# Patient Record
Sex: Male | Born: 1945
Health system: Southern US, Community
[De-identification: ages and names within clinical notes are randomized; demographics above are authoritative.]

## PROBLEM LIST (undated history)

## (undated) DIAGNOSIS — R7303 Prediabetes: Secondary | ICD-10-CM

## (undated) DIAGNOSIS — K219 Gastro-esophageal reflux disease without esophagitis: Secondary | ICD-10-CM

## (undated) DIAGNOSIS — E785 Hyperlipidemia, unspecified: Secondary | ICD-10-CM

## (undated) DIAGNOSIS — M1990 Unspecified osteoarthritis, unspecified site: Secondary | ICD-10-CM

## (undated) DIAGNOSIS — I499 Cardiac arrhythmia, unspecified: Secondary | ICD-10-CM

## (undated) HISTORY — PX: ESOPHAGOGASTRODUODENOSCOPY: SHX1529

## (undated) HISTORY — PX: MOUTH SURGERY: SHX715

## (undated) HISTORY — DX: Hyperlipidemia, unspecified: E78.5

## (undated) HISTORY — DX: Gastro-esophageal reflux disease without esophagitis: K21.9

## (undated) HISTORY — PX: COLONOSCOPY: SHX174

---

## 2005-08-09 DIAGNOSIS — I499 Cardiac arrhythmia, unspecified: Secondary | ICD-10-CM

## 2005-08-09 HISTORY — DX: Cardiac arrhythmia, unspecified: I49.9

## 2013-09-27 DIAGNOSIS — H468 Other optic neuritis: Secondary | ICD-10-CM | POA: Diagnosis not present

## 2013-09-27 DIAGNOSIS — H04129 Dry eye syndrome of unspecified lacrimal gland: Secondary | ICD-10-CM | POA: Diagnosis not present

## 2013-10-10 DIAGNOSIS — E7800 Pure hypercholesterolemia, unspecified: Secondary | ICD-10-CM | POA: Diagnosis not present

## 2013-10-10 DIAGNOSIS — K219 Gastro-esophageal reflux disease without esophagitis: Secondary | ICD-10-CM | POA: Diagnosis not present

## 2013-11-29 DIAGNOSIS — Z23 Encounter for immunization: Secondary | ICD-10-CM | POA: Diagnosis not present

## 2014-03-04 DIAGNOSIS — M25519 Pain in unspecified shoulder: Secondary | ICD-10-CM | POA: Diagnosis not present

## 2014-03-04 DIAGNOSIS — E7800 Pure hypercholesterolemia, unspecified: Secondary | ICD-10-CM | POA: Diagnosis not present

## 2014-03-27 DIAGNOSIS — M25519 Pain in unspecified shoulder: Secondary | ICD-10-CM | POA: Diagnosis not present

## 2014-04-18 DIAGNOSIS — M25519 Pain in unspecified shoulder: Secondary | ICD-10-CM | POA: Diagnosis not present

## 2014-07-30 DIAGNOSIS — M729 Fibroblastic disorder, unspecified: Secondary | ICD-10-CM | POA: Diagnosis not present

## 2014-08-29 DIAGNOSIS — I10 Essential (primary) hypertension: Secondary | ICD-10-CM | POA: Diagnosis not present

## 2014-08-29 DIAGNOSIS — E784 Other hyperlipidemia: Secondary | ICD-10-CM | POA: Diagnosis not present

## 2014-09-09 DIAGNOSIS — E784 Other hyperlipidemia: Secondary | ICD-10-CM | POA: Diagnosis not present

## 2014-09-09 DIAGNOSIS — J209 Acute bronchitis, unspecified: Secondary | ICD-10-CM | POA: Diagnosis not present

## 2015-01-22 DIAGNOSIS — E784 Other hyperlipidemia: Secondary | ICD-10-CM | POA: Diagnosis not present

## 2015-01-22 DIAGNOSIS — K219 Gastro-esophageal reflux disease without esophagitis: Secondary | ICD-10-CM | POA: Diagnosis not present

## 2015-03-17 DIAGNOSIS — H04123 Dry eye syndrome of bilateral lacrimal glands: Secondary | ICD-10-CM | POA: Diagnosis not present

## 2015-03-18 DIAGNOSIS — H04123 Dry eye syndrome of bilateral lacrimal glands: Secondary | ICD-10-CM | POA: Diagnosis not present

## 2015-03-31 DIAGNOSIS — H04123 Dry eye syndrome of bilateral lacrimal glands: Secondary | ICD-10-CM | POA: Diagnosis not present

## 2015-04-17 DIAGNOSIS — Z23 Encounter for immunization: Secondary | ICD-10-CM | POA: Diagnosis not present

## 2015-05-08 DIAGNOSIS — K648 Other hemorrhoids: Secondary | ICD-10-CM | POA: Diagnosis not present

## 2015-05-08 DIAGNOSIS — Z86010 Personal history of colonic polyps: Secondary | ICD-10-CM | POA: Diagnosis not present

## 2015-05-08 DIAGNOSIS — D123 Benign neoplasm of transverse colon: Secondary | ICD-10-CM | POA: Diagnosis not present

## 2015-05-08 DIAGNOSIS — K635 Polyp of colon: Secondary | ICD-10-CM | POA: Diagnosis not present

## 2015-09-03 ENCOUNTER — Ambulatory Visit: Payer: Self-pay | Admitting: Family Medicine

## 2015-09-04 ENCOUNTER — Encounter: Payer: Self-pay | Admitting: Family Medicine

## 2015-09-04 ENCOUNTER — Ambulatory Visit (INDEPENDENT_AMBULATORY_CARE_PROVIDER_SITE_OTHER): Payer: Medicare Other | Admitting: Family Medicine

## 2015-09-04 VITALS — BP 138/82 | HR 79 | Resp 16 | Ht 70.0 in | Wt 204.2 lb

## 2015-09-04 DIAGNOSIS — E785 Hyperlipidemia, unspecified: Secondary | ICD-10-CM | POA: Insufficient documentation

## 2015-09-04 DIAGNOSIS — H04123 Dry eye syndrome of bilateral lacrimal glands: Secondary | ICD-10-CM | POA: Insufficient documentation

## 2015-09-04 DIAGNOSIS — R739 Hyperglycemia, unspecified: Secondary | ICD-10-CM | POA: Diagnosis not present

## 2015-09-04 DIAGNOSIS — Z23 Encounter for immunization: Secondary | ICD-10-CM

## 2015-09-04 DIAGNOSIS — Z86010 Personal history of colonic polyps: Secondary | ICD-10-CM

## 2015-09-04 DIAGNOSIS — H04129 Dry eye syndrome of unspecified lacrimal gland: Secondary | ICD-10-CM

## 2015-09-04 DIAGNOSIS — M1990 Unspecified osteoarthritis, unspecified site: Secondary | ICD-10-CM | POA: Diagnosis not present

## 2015-09-04 DIAGNOSIS — I10 Essential (primary) hypertension: Secondary | ICD-10-CM | POA: Insufficient documentation

## 2015-09-04 DIAGNOSIS — K219 Gastro-esophageal reflux disease without esophagitis: Secondary | ICD-10-CM | POA: Diagnosis not present

## 2015-09-04 DIAGNOSIS — R7303 Prediabetes: Secondary | ICD-10-CM | POA: Diagnosis not present

## 2015-09-04 MED ORDER — ZOSTER VACCINE LIVE 19400 UNT/0.65ML ~~LOC~~ SOLR: 0.6500 mL | Freq: Once | SUBCUTANEOUS | Status: DC

## 2015-09-04 MED ORDER — ATORVASTATIN CALCIUM 10 MG PO TABS: 10.0000 mg | ORAL_TABLET | Freq: Every day | ORAL | Status: DC

## 2015-09-04 NOTE — Patient Instructions (Signed)
Attempt to taper off Zantac.

## 2015-09-05 DIAGNOSIS — Z860100 Personal history of colon polyps, unspecified: Secondary | ICD-10-CM | POA: Insufficient documentation

## 2015-09-05 LAB — CBC
HEMATOCRIT: 45.6 % (ref 37.5–51.0)
Hemoglobin: 15.8 g/dL (ref 12.6–17.7)
MCH: 32 pg (ref 26.6–33.0)
MCHC: 34.6 g/dL (ref 31.5–35.7)
MCV: 93 fL (ref 79–97)
PLATELETS: 243 10*3/uL (ref 150–379)
RBC: 4.93 x10E6/uL (ref 4.14–5.80)
RDW: 13.5 % (ref 12.3–15.4)
WBC: 5.9 10*3/uL (ref 3.4–10.8)

## 2015-09-05 LAB — LIPID PANEL
CHOLESTEROL TOTAL: 132 mg/dL (ref 100–199)
Chol/HDL Ratio: 3 ratio units (ref 0.0–5.0)
HDL: 44 mg/dL (ref >39)
LDL Calculated: 71 mg/dL (ref 0–99)
TRIGLYCERIDES: 87 mg/dL (ref 0–149)
VLDL Cholesterol Cal: 17 mg/dL (ref 5–40)

## 2015-09-05 LAB — COMPREHENSIVE METABOLIC PANEL
ALBUMIN: 4.6 g/dL (ref 3.6–4.8)
ALK PHOS: 135 IU/L — AB (ref 39–117)
ALT: 41 IU/L (ref 0–44)
AST: 30 IU/L (ref 0–40)
Albumin/Globulin Ratio: 1.9 (ref 1.1–2.5)
BILIRUBIN TOTAL: 0.3 mg/dL (ref 0.0–1.2)
BUN / CREAT RATIO: 17 (ref 10–22)
BUN: 16 mg/dL (ref 8–27)
CHLORIDE: 97 mmol/L (ref 96–106)
CO2: 25 mmol/L (ref 18–29)
Calcium: 9.3 mg/dL (ref 8.6–10.2)
Creatinine, Ser: 0.93 mg/dL (ref 0.76–1.27)
GFR calc Af Amer: 97 mL/min/{1.73_m2} (ref >59)
GFR calc non Af Amer: 84 mL/min/{1.73_m2} (ref >59)
GLUCOSE: 110 mg/dL — AB (ref 65–99)
Globulin, Total: 2.4 g/dL (ref 1.5–4.5)
Potassium: 4.4 mmol/L (ref 3.5–5.2)
Sodium: 139 mmol/L (ref 134–144)
Total Protein: 7 g/dL (ref 6.0–8.5)

## 2015-09-05 NOTE — Progress Notes (Addendum)
Date:  09/04/2015   Name:  Jeff Mcmahon   DOB:  08/14/1945   MRN:  [id number]  PCP:  No primary care provider on file.    Chief Complaint: Establish Care   History of Present Illness:  This is a 70 y.o. male to establish care. Hx GERD on Zantac bid x 5 yrs, previously on PPI, no EGD. Hx HLD on Lipitor x 3-4 yrs, needs refill, no established CV dz. Takes asa for prevention. Takes omega-3 for dry eyes, rheum w/u for Sjogren's negative, followed by optho Takes G/C for diffuse OA, seems to help. Tetanus imm < 10 yrs ago, pneumo imms x 2, needs zoster imm. Hx colonoscopy x 2 with polyps, last age 51. CMP/lipids year ago ok. Weight up 15# recently.  Review of Systems:  Review of Systems  Constitutional: Negative for fever and fatigue.  HENT: Negative for ear pain and sore throat.   Eyes: Negative for pain.  Respiratory: Negative for shortness of breath.   Cardiovascular: Negative for chest pain and leg swelling.  Gastrointestinal: Negative for abdominal pain.  Endocrine: Negative for polyuria.  Genitourinary: Negative for difficulty urinating.  Neurological: Negative for syncope and light-headedness.    Patient Active Problem List   Diagnosis Date Noted  . Hypertension 09/04/2015  . Hyperlipidemia 09/04/2015  . GERD (gastroesophageal reflux disease) 09/04/2015  . Dry eyes 09/04/2015    Prior to Admission medications   Medication Sig Start Date End Date Taking? Authorizing Provider  aspirin 81 MG tablet Take 81 mg by mouth daily.   Yes Historical Provider, MD  atorvastatin (LIPITOR) 10 MG tablet Take 1 tablet (10 mg total) by mouth daily. 09/04/15  Yes Adline Potter, MD  Glucosamine-Chondroitin 1500-1200 MG/30ML LIQD Take by mouth.   Yes Historical Provider, MD  Multiple Vitamins-Minerals (MENS 50+ MULTI VITAMIN/MIN PO) Take by mouth.   Yes Historical Provider, MD  Omega-3 Fatty Acids (RA FISH OIL) 1400 MG CPDR Take by mouth.   Yes Historical Provider, MD  ranitidine (ZANTAC) 150 MG  tablet Take 150 mg by mouth 2 (two) times daily.   Yes Historical Provider, MD  zoster vaccine live, PF, (ZOSTAVAX) 91478 UNT/0.65ML injection Inject 19,400 Units into the skin once. 09/04/15   Adline Potter, MD    No Known Allergies  History reviewed. No pertinent past surgical history.  Social History  Substance Use Topics  . Smoking status: Never Smoker   . Smokeless tobacco: Never Used  . Alcohol Use: 2.4 oz/week    4 Glasses of wine per week    Family History  Problem Relation Age of Onset  . Lymphoma Mother 59  . Hypertension Mother   . Cancer Father   . Alcohol abuse Father   . Mental illness Father   . Heart disease Brother     Medication list has been reviewed and updated.  Physical Examination: BP 138/82 mmHg  Pulse 79  Resp 16  Ht 5\' 10"  (1.778 m)  Wt 204 lb 3.2 oz (92.625 kg)  BMI 29.30 kg/m2  SpO2 97%  Physical Exam  Constitutional: He is oriented to person, place, and time. He appears well-developed and well-nourished.  HENT:  Head: Normocephalic and atraumatic.  Right Ear: External ear normal.  Left Ear: External ear normal.  Nose: Nose normal.  Mouth/Throat: Oropharynx is clear and moist.  TM's clear  Eyes: Conjunctivae and EOM are normal. Pupils are equal, round, and reactive to light.  Neck: Neck supple. No thyromegaly present.  Cardiovascular: Normal rate, regular rhythm  and normal heart sounds.   Pulmonary/Chest: Effort normal and breath sounds normal.  Abdominal: Soft. He exhibits no distension and no mass. There is no tenderness.  Genitourinary: Penis normal.  Testes normal no hernias  Musculoskeletal: He exhibits no edema.  Lymphadenopathy:    He has no cervical adenopathy.  Neurological: He is alert and oriented to person, place, and time. Coordination normal.  Skin: Skin is warm and dry.  Psychiatric: He has a normal mood and affect. His behavior is normal.  Nursing note and vitals reviewed.   Assessment and Plan:  1.  Hyperlipidemia Unclear indication for statin given lack of established CV dz but will refill Lipitor for now and check labs - Lipid Profile - Comprehensive Metabolic Panel (CMET)  2. Gastroesophageal reflux disease without esophagitis Discussed weaning off to avoid LT se's, will attempt - CBC  3. Osteoarthritis, unspecified osteoarthritis type, unspecified site Well controlled, cont G/C  4. Dry eyes, unspecified laterality Followed by optho (may need referral)  5. Need for zoster vaccination Zoster imm rx sent  6. Hx colon polyps Will need f/u colonoscopy soon  Return in about 4 weeks (around 10/02/2015).  [name]. [facility]  09/05/2015    Addendum: Calculated 10 yr CV risk on statin 15%, plan to continue statin/asa

## 2015-09-09 ENCOUNTER — Encounter: Payer: Self-pay | Admitting: Family Medicine

## 2015-09-09 DIAGNOSIS — R7303 Prediabetes: Secondary | ICD-10-CM | POA: Insufficient documentation

## 2015-09-10 LAB — HGB A1C W/O EAG: HEMOGLOBIN A1C: 5.7 % — AB (ref 4.8–5.6)

## 2015-09-10 LAB — SPECIMEN STATUS REPORT

## 2015-10-02 ENCOUNTER — Ambulatory Visit (INDEPENDENT_AMBULATORY_CARE_PROVIDER_SITE_OTHER): Payer: Medicare Other | Admitting: Family Medicine

## 2015-10-02 ENCOUNTER — Encounter: Payer: Self-pay | Admitting: Family Medicine

## 2015-10-02 VITALS — BP 116/76 | HR 72 | Ht 70.0 in | Wt 204.0 lb

## 2015-10-02 DIAGNOSIS — M150 Primary generalized (osteo)arthritis: Secondary | ICD-10-CM | POA: Diagnosis not present

## 2015-10-02 DIAGNOSIS — Z86010 Personal history of colonic polyps: Secondary | ICD-10-CM

## 2015-10-02 DIAGNOSIS — M159 Polyosteoarthritis, unspecified: Secondary | ICD-10-CM

## 2015-10-02 DIAGNOSIS — M47812 Spondylosis without myelopathy or radiculopathy, cervical region: Secondary | ICD-10-CM | POA: Insufficient documentation

## 2015-10-02 DIAGNOSIS — E785 Hyperlipidemia, unspecified: Secondary | ICD-10-CM

## 2015-10-02 DIAGNOSIS — M4722 Other spondylosis with radiculopathy, cervical region: Secondary | ICD-10-CM | POA: Insufficient documentation

## 2015-10-02 DIAGNOSIS — H04129 Dry eye syndrome of unspecified lacrimal gland: Secondary | ICD-10-CM | POA: Diagnosis not present

## 2015-10-02 DIAGNOSIS — R7303 Prediabetes: Secondary | ICD-10-CM | POA: Diagnosis not present

## 2015-10-02 DIAGNOSIS — K219 Gastro-esophageal reflux disease without esophagitis: Secondary | ICD-10-CM

## 2015-10-02 DIAGNOSIS — M1990 Unspecified osteoarthritis, unspecified site: Secondary | ICD-10-CM | POA: Insufficient documentation

## 2015-10-02 NOTE — Progress Notes (Signed)
Date:  10/02/2015   Name:  Jeff Mcmahon   DOB:  07/27/1946   MRN:  [id number]  PCP:  Adline Potter, MD    Chief Complaint: Follow-up and Hyperlipidemia   History of Present Illness:  This is a 70 y.o. male for 1 month f/u from initial visit. Attempt to wean off Zantac unsuccessful, back to taking qhs with good sx control. OA well controlled on G/C. Dry eyes well controlled on eye drops, has seen optho in past who did not recommend further eval. Received zoster imm at pharmacy. Has last colonoscopy 04/2015 told repeat in 5 years. Blood work showed prediabetes. Asks about hep C screening. Walking several miles daily.  Review of Systems:  Review of Systems  Respiratory: Negative for shortness of breath.   Cardiovascular: Negative for chest pain and leg swelling.  Endocrine: Negative for polyuria.  Genitourinary: Negative for difficulty urinating.  Neurological: Negative for syncope and light-headedness.    Patient Active Problem List   Diagnosis Date Noted  . Prediabetes 09/09/2015  . Hx of colonic polyps 09/05/2015  . Hyperlipidemia 09/04/2015  . GERD (gastroesophageal reflux disease) 09/04/2015  . Dry eyes 09/04/2015    Prior to Admission medications   Medication Sig Start Date End Date Taking? Authorizing Provider  aspirin 81 MG tablet Take 81 mg by mouth daily.   Yes Historical Provider, MD  atorvastatin (LIPITOR) 10 MG tablet Take 1 tablet (10 mg total) by mouth daily. 09/04/15  Yes Adline Potter, MD  carboxymethylcellulose (REFRESH PLUS) 0.5 % SOLN 1 drop 3 (three) times daily as needed.   Yes Historical Provider, MD  Glucosamine-Chondroitin 1500-1200 MG/30ML LIQD Take by mouth.   Yes Historical Provider, MD  Multiple Vitamins-Minerals (MENS 50+ MULTI VITAMIN/MIN PO) Take by mouth.   Yes Historical Provider, MD  Omega-3 Fatty Acids (RA FISH OIL) 1400 MG CPDR Take by mouth.   Yes Historical Provider, MD  ranitidine (ZANTAC) 150 MG tablet Take 150 mg by mouth 2 (two) times  daily.   Yes Historical Provider, MD    No Known Allergies  Past Surgical History  Procedure Laterality Date  . No past surgeries      Social History  Substance Use Topics  . Smoking status: Never Smoker   . Smokeless tobacco: Never Used  . Alcohol Use: 2.4 oz/week    4 Glasses of wine per week     Comment: occasional    Family History  Problem Relation Age of Onset  . Lymphoma Mother 38  . Hypertension Mother   . Cancer Father   . Alcohol abuse Father   . Mental illness Father   . Heart disease Brother     Medication list has been reviewed and updated.  Physical Examination: BP 116/76 mmHg  Pulse 72  Ht 5\' 10"  (1.778 m)  Wt 204 lb (92.534 kg)  BMI 29.27 kg/m2  Physical Exam  Constitutional: He appears well-developed and well-nourished.  Cardiovascular: Normal rate, regular rhythm and normal heart sounds.   Pulmonary/Chest: Effort normal and breath sounds normal.  Musculoskeletal: He exhibits no edema.  Neurological: He is alert.  Skin: Skin is warm and dry.  Psychiatric: He has a normal mood and affect. His behavior is normal.  Nursing note and vitals reviewed.   Assessment and Plan:  1. Prediabetes Dx/px discussed, recommend NCS diet, exercise, and weight loss, recheck a1c next visit  2. Gastroesophageal reflux disease without esophagitis Well controlled on qhs Zantac, continue  3. Hyperlipidemia Recommend continue Lipitor/asa given 68yr CVR  15%  4. Dry eyes, unspecified laterality Well controlled on Refresh eye drops  5. OA Well controlled on G/C  6. Hx of colonic polyps Repeat colonoscopy 2021  7. Health maintenance Consider hep C screening with a1c next visit  Return in about 3 months (around 12/30/2015).  [name]. [facility]  10/02/2015

## 2015-12-30 ENCOUNTER — Ambulatory Visit (INDEPENDENT_AMBULATORY_CARE_PROVIDER_SITE_OTHER): Payer: Medicare Other | Admitting: Family Medicine

## 2015-12-30 ENCOUNTER — Encounter: Payer: Self-pay | Admitting: Family Medicine

## 2015-12-30 VITALS — BP 120/76 | HR 64 | Ht 70.0 in | Wt 198.0 lb

## 2015-12-30 DIAGNOSIS — E785 Hyperlipidemia, unspecified: Secondary | ICD-10-CM | POA: Diagnosis not present

## 2015-12-30 DIAGNOSIS — K219 Gastro-esophageal reflux disease without esophagitis: Secondary | ICD-10-CM | POA: Diagnosis not present

## 2015-12-30 DIAGNOSIS — Z0000 Encounter for general adult medical examination without abnormal findings: Secondary | ICD-10-CM

## 2015-12-30 DIAGNOSIS — H04123 Dry eye syndrome of bilateral lacrimal glands: Secondary | ICD-10-CM

## 2015-12-30 DIAGNOSIS — M159 Polyosteoarthritis, unspecified: Secondary | ICD-10-CM

## 2015-12-30 DIAGNOSIS — M150 Primary generalized (osteo)arthritis: Secondary | ICD-10-CM | POA: Diagnosis not present

## 2015-12-30 DIAGNOSIS — R7303 Prediabetes: Secondary | ICD-10-CM | POA: Diagnosis not present

## 2015-12-30 NOTE — Progress Notes (Signed)
Date:  12/30/2015   Name:  Jeff Mcmahon   DOB:  [birth year]   MRN:  [id number]  PCP:  Adline Potter, MD    Chief Complaint: Follow-up   History of Present Illness:  This is a 70 y.o. male seen in three month f/u. No new concerns. Recent dx prediabetes, now walking 15-20 miles per week and weight down 6#. GERD well controlled on Zantac qhs, attempt to d/c unsuccessful but plans to try again. Taking Lipitor and asa for elevated CVR. Dry eyes and OA sxs stable.  Review of Systems:  Review of Systems  Constitutional: Negative for fever and fatigue.  Respiratory: Negative for cough and shortness of breath.   Cardiovascular: Negative for chest pain and leg swelling.  Endocrine: Negative for polyuria.  Genitourinary: Negative for difficulty urinating.  Neurological: Negative for syncope and light-headedness.    Patient Active Problem List   Diagnosis Date Noted  . Osteoarthritis 10/02/2015  . Prediabetes 09/09/2015  . Hx of colonic polyps 09/05/2015  . Hyperlipidemia 09/04/2015  . GERD (gastroesophageal reflux disease) 09/04/2015  . Dry eyes 09/04/2015    Prior to Admission medications   Medication Sig Start Date End Date Taking? Authorizing Provider  aspirin 81 MG tablet Take 81 mg by mouth daily.   Yes Historical Provider, MD  atorvastatin (LIPITOR) 10 MG tablet Take 1 tablet (10 mg total) by mouth daily. 09/04/15  Yes Adline Potter, MD  carboxymethylcellulose (REFRESH PLUS) 0.5 % SOLN 1 drop 3 (three) times daily as needed.   Yes Historical Provider, MD  Glucosamine-Chondroitin 1500-1200 MG/30ML LIQD Take by mouth.   Yes Historical Provider, MD  Multiple Vitamins-Minerals (MENS 50+ MULTI VITAMIN/MIN PO) Take by mouth.   Yes Historical Provider, MD  Omega-3 Fatty Acids (RA FISH OIL) 1400 MG CPDR Take by mouth.   Yes Historical Provider, MD  ranitidine (ZANTAC) 150 MG tablet Take 150 mg by mouth at bedtime.    Yes Historical Provider, MD    No Known Allergies  Past Surgical  History  Procedure Laterality Date  . No past surgeries      Social History  Substance Use Topics  . Smoking status: Never Smoker   . Smokeless tobacco: Never Used  . Alcohol Use: 2.4 oz/week    4 Glasses of wine per week     Comment: occasional    Family History  Problem Relation Age of Onset  . Lymphoma Mother 77  . Hypertension Mother   . Cancer Father   . Alcohol abuse Father   . Mental illness Father   . Heart disease Brother     Medication list has been reviewed and updated.  Physical Examination: BP 120/76 mmHg  Pulse 64  Ht 5\' 10"  (1.778 m)  Wt 198 lb (89.812 kg)  BMI 28.41 kg/m2  Physical Exam  Constitutional: He appears well-developed and well-nourished.  Cardiovascular: Normal rate, regular rhythm and normal heart sounds.   Pulmonary/Chest: Effort normal and breath sounds normal.  Musculoskeletal: He exhibits no edema.  Neurological: He is alert.  Skin: Skin is warm and dry.  Psychiatric: He has a normal mood and affect. His behavior is normal.  Nursing note and vitals reviewed.   Assessment and Plan:  1. Prediabetes Recheck a1c today - HgB A1c  2. Hyperlipidemia Well controlled on Lipitor/asa (10 yr CVR 15%)  3. Dry eyes, bilateral Well controlled on Refresh gtts  4. Primary osteoarthritis involving multiple joints Well controlled on G/C  5. Gastroesophageal reflux disease, esophagitis presence not specified  Well controlled on qhs Zantac  6. Health care maintenance - Hepatitis C Antibody  Return in about 6 months (around 07/01/2016).  [name]. [facility]  12/30/2015

## 2015-12-31 ENCOUNTER — Ambulatory Visit: Payer: Medicare Other | Admitting: Family Medicine

## 2015-12-31 LAB — HEMOGLOBIN A1C
ESTIMATED AVERAGE GLUCOSE: 117 mg/dL
HEMOGLOBIN A1C: 5.7 % — AB (ref 4.8–5.6)

## 2015-12-31 LAB — HEPATITIS C ANTIBODY

## 2016-03-22 ENCOUNTER — Ambulatory Visit (INDEPENDENT_AMBULATORY_CARE_PROVIDER_SITE_OTHER): Payer: Medicare Other | Admitting: Family Medicine

## 2016-03-22 ENCOUNTER — Encounter: Payer: Self-pay | Admitting: Family Medicine

## 2016-03-22 VITALS — BP 120/78 | HR 80 | Temp 97.9°F | Ht 70.0 in | Wt 198.0 lb

## 2016-03-22 DIAGNOSIS — J0100 Acute maxillary sinusitis, unspecified: Secondary | ICD-10-CM | POA: Diagnosis not present

## 2016-03-22 MED ORDER — AZITHROMYCIN 250 MG PO TABS
ORAL_TABLET | ORAL | 0 refills | Status: DC
Start: 2016-03-22 — End: 2016-07-12

## 2016-03-22 NOTE — Progress Notes (Signed)
Name: Jeff Mcmahon   MRN: [id number]    DOB: 05-25-1946   Date:03/22/2016       Progress Note  Subjective  Chief Complaint  Chief Complaint  Patient presents with  . Sinusitis    cong, cough, tickling in throat- tried taking OTC cough DM    Sinusitis  This is a new problem. The current episode started in the past 7 days. The problem has been gradually worsening since onset. There has been no fever. The pain is mild. Associated symptoms include congestion, coughing, headaches, sinus pressure and a sore throat. Pertinent negatives include no chills, diaphoresis, ear pain, hoarse voice, neck pain, shortness of breath, sneezing or swollen glands. Past treatments include acetaminophen (cough dm). The treatment provided no relief.    No problem-specific Assessment & Plan notes found for this encounter.   Past Medical History:  Diagnosis Date  . GERD (gastroesophageal reflux disease)   . Hyperlipidemia   . Hypertension     Past Surgical History:  Procedure Laterality Date  . NO PAST SURGERIES      Family History  Problem Relation Age of Onset  . Lymphoma Mother 34  . Hypertension Mother   . Cancer Father   . Alcohol abuse Father   . Mental illness Father   . Heart disease Brother     Social History   Social History  . Marital status: Married    Spouse name: N/A  . Number of children: N/A  . Years of education: N/A   Occupational History  . Not on file.   Social History Main Topics  . Smoking status: Never Smoker  . Smokeless tobacco: Never Used  . Alcohol use 2.4 oz/week    4 Glasses of wine per week     Comment: occasional  . Drug use: No  . Sexual activity: Not on file   Other Topics Concern  . Not on file   Social History Narrative  . No narrative on file    No Known Allergies   Review of Systems  Constitutional: Negative for chills, diaphoresis and malaise/fatigue.  HENT: Positive for congestion, sinus pressure and sore throat. Negative for ear  pain, hoarse voice and sneezing.   Respiratory: Positive for cough. Negative for shortness of breath.   Gastrointestinal: Negative for nausea.  Genitourinary: Negative for dysuria.  Musculoskeletal: Negative for back pain and neck pain.  Skin: Negative for itching and rash.  Neurological: Positive for headaches. Negative for dizziness, tingling and weakness.  Endo/Heme/Allergies: Positive for environmental allergies.     Objective  Vitals:   03/22/16 1351  BP: 120/78  Pulse: 80  Temp: 97.9 F (36.6 C)  TempSrc: Oral  Weight: 198 lb (89.8 kg)  Height: 5\' 10"  (1.778 m)    Physical Exam  Constitutional: He is oriented to person, place, and time and well-developed, well-nourished, and in no distress.  HENT:  Head: Normocephalic.  Right Ear: External ear normal.  Left Ear: External ear normal.  Nose: Nose normal.  Mouth/Throat: Oropharynx is clear and moist.  Eyes: Conjunctivae and EOM are normal. Pupils are equal, round, and reactive to light. Right eye exhibits no discharge. Left eye exhibits no discharge. No scleral icterus.  Neck: Normal range of motion. Neck supple. No JVD present. No tracheal deviation present. No thyromegaly present.  Cardiovascular: Normal rate, regular rhythm, normal heart sounds and intact distal pulses.  Exam reveals no gallop and no friction rub.   No murmur heard. Pulmonary/Chest: Breath sounds normal. No respiratory distress.  He has no wheezes. He has no rales.  Abdominal: Soft. Bowel sounds are normal. He exhibits no mass. There is no hepatosplenomegaly. There is no tenderness. There is no rebound, no guarding and no CVA tenderness.  Musculoskeletal: Normal range of motion. He exhibits no edema or tenderness.  Lymphadenopathy:    He has no cervical adenopathy.  Neurological: He is alert and oriented to person, place, and time. He has normal sensation, normal strength, normal reflexes and intact cranial nerves. No cranial nerve deficit.  Skin: Skin  is warm. No rash noted.  Psychiatric: Mood and affect normal.  Nursing note and vitals reviewed.     Assessment & Plan  Problem List Items Addressed This Visit    None    Visit Diagnoses    Acute maxillary sinusitis, recurrence not specified    -  Primary   Relevant Medications   azithromycin (ZITHROMAX) 250 MG tablet        Dr. [facility]  03/22/16

## 2016-05-04 DIAGNOSIS — Z1832 Retained tooth: Secondary | ICD-10-CM | POA: Diagnosis not present

## 2016-07-12 ENCOUNTER — Encounter: Payer: Self-pay | Admitting: Family Medicine

## 2016-07-12 ENCOUNTER — Ambulatory Visit (INDEPENDENT_AMBULATORY_CARE_PROVIDER_SITE_OTHER): Payer: Medicare Other | Admitting: Family Medicine

## 2016-07-12 VITALS — BP 108/78 | HR 65 | Resp 16 | Ht 70.0 in | Wt 196.8 lb

## 2016-07-12 DIAGNOSIS — E785 Hyperlipidemia, unspecified: Secondary | ICD-10-CM

## 2016-07-12 DIAGNOSIS — K219 Gastro-esophageal reflux disease without esophagitis: Secondary | ICD-10-CM | POA: Diagnosis not present

## 2016-07-12 DIAGNOSIS — M150 Primary generalized (osteo)arthritis: Secondary | ICD-10-CM

## 2016-07-12 DIAGNOSIS — S83411A Sprain of medial collateral ligament of right knee, initial encounter: Secondary | ICD-10-CM

## 2016-07-12 DIAGNOSIS — R351 Nocturia: Secondary | ICD-10-CM | POA: Diagnosis not present

## 2016-07-12 DIAGNOSIS — R7303 Prediabetes: Secondary | ICD-10-CM

## 2016-07-12 DIAGNOSIS — M159 Polyosteoarthritis, unspecified: Secondary | ICD-10-CM

## 2016-07-13 ENCOUNTER — Other Ambulatory Visit: Payer: Self-pay

## 2016-07-13 MED ORDER — ATORVASTATIN CALCIUM 10 MG PO TABS: 10.0000 mg | ORAL_TABLET | Freq: Every day | ORAL | 3 refills | Status: DC

## 2016-07-13 NOTE — Progress Notes (Addendum)
Date:  07/12/2016   Name:  Jeff Mcmahon   DOB:  [birth year]   MRN:  [id number]  PCP:  Adline Potter, MD    Chief Complaint: Hyperlipidemia; Knee Pain (right knee pain 1 month after bending to move furniture. ); and Prediabetes   History of Present Illness:  This is a 70 y.o. male seen for ten month f/u. Sprained inside R knee, wants checked. Also requests PSA. Off Zantac, GERD sxs ok.   Review of Systems:  Review of Systems  Constitutional: Negative for fever.  Respiratory: Negative for cough and shortness of breath.   Cardiovascular: Negative for chest pain and leg swelling.  Endocrine: Negative for polyuria.  Neurological: Negative for dizziness and syncope.  Psychiatric/Behavioral: Negative for confusion.    Patient Active Problem List   Diagnosis Date Noted  . Osteoarthritis 10/02/2015  . Prediabetes 09/09/2015  . Hx of colonic polyps 09/05/2015  . Hyperlipidemia 09/04/2015  . GERD (gastroesophageal reflux disease) 09/04/2015  . Dry eyes 09/04/2015    Prior to Admission medications   Medication Sig Start Date End Date Taking? Authorizing Provider  acetaminophen (TYLENOL) 325 MG tablet Take 650 mg by mouth every 6 (six) hours as needed.   Yes Historical Provider, MD  aspirin 81 MG tablet Take 81 mg by mouth daily.   Yes Historical Provider, MD  carboxymethylcellulose (REFRESH PLUS) 0.5 % SOLN 1 drop 3 (three) times daily as needed.   Yes Historical Provider, MD  Glucosamine-Chondroitin 1500-1200 MG/30ML LIQD Take by mouth.   Yes Historical Provider, MD  Multiple Vitamins-Minerals (MENS 50+ MULTI VITAMIN/MIN PO) Take by mouth.   Yes Historical Provider, MD  Omega-3 Fatty Acids (RA FISH OIL) 1400 MG CPDR Take by mouth.   Yes Historical Provider, MD  atorvastatin (LIPITOR) 10 MG tablet Take 1 tablet (10 mg total) by mouth daily. 07/13/16   Adline Potter, MD    No Known Allergies  Past Surgical History:  Procedure Laterality Date  . NO PAST SURGERIES      Social  History  Substance Use Topics  . Smoking status: Never Smoker  . Smokeless tobacco: Never Used  . Alcohol use 2.4 oz/week    4 Glasses of wine per week     Comment: occasional    Family History  Problem Relation Age of Onset  . Lymphoma Mother 63  . Hypertension Mother   . Cancer Father   . Alcohol abuse Father   . Mental illness Father   . Heart disease Brother     Medication list has been reviewed and updated.  Physical Examination: BP 108/78   Pulse 65   Resp 16   Ht 5\' 10"  (1.778 m)   Wt 196 lb 12.8 oz (89.3 kg)   SpO2 98%   BMI 28.24 kg/m   Physical Exam  Constitutional: He appears well-developed and well-nourished.  Cardiovascular: Normal rate, regular rhythm and normal heart sounds.   Pulmonary/Chest: Effort normal and breath sounds normal.  Musculoskeletal: He exhibits no edema.  L knee stable, sl tender over MCL  Neurological: He is alert.  Skin: Skin is warm and dry.  Psychiatric: He has a normal mood and affect. His behavior is normal.  Nursing note and vitals reviewed.   Assessment and Plan:  1. Sprain of medial collateral ligament of right knee, initial encounter Expect spont resolution, consider PT if persists  2. Prediabetes - HgB A1c  3. Hyperlipidemia, unspecified hyperlipidemia type On Lipitor/asa - Lipid Profile  4. Primary osteoarthritis involving multiple joints  Cont G/C, Tylenol prn  5. Gastroesophageal reflux disease, esophagitis presence not specified Ok off Zantac  6. Nocturia - PSA  Addendum: 10 yr CVR 8.4% on statin/asa, consider d/c next visit given lack of established CVD.  Return in about 6 months (around 01/10/2017).  [name]. Leland Raver, [facility]  07/13/2016

## 2016-07-14 LAB — HEMOGLOBIN A1C
Est. average glucose Bld gHb Est-mCnc: 108 mg/dL
HEMOGLOBIN A1C: 5.4 % (ref 4.8–5.6)

## 2016-07-14 LAB — LIPID PANEL
CHOLESTEROL TOTAL: 141 mg/dL (ref 100–199)
Chol/HDL Ratio: 3.1 ratio units (ref 0.0–5.0)
HDL: 46 mg/dL (ref >39)
LDL Calculated: 82 mg/dL (ref 0–99)
TRIGLYCERIDES: 65 mg/dL (ref 0–149)
VLDL CHOLESTEROL CAL: 13 mg/dL (ref 5–40)

## 2016-07-14 LAB — PSA: PROSTATE SPECIFIC AG, SERUM: 0.6 ng/mL (ref 0.0–4.0)

## 2016-07-29 ENCOUNTER — Encounter: Payer: Self-pay | Admitting: Family Medicine

## 2016-07-29 ENCOUNTER — Ambulatory Visit (INDEPENDENT_AMBULATORY_CARE_PROVIDER_SITE_OTHER): Payer: Medicare Other | Admitting: Family Medicine

## 2016-07-29 VITALS — BP 140/80 | HR 78 | Temp 97.7°F | Resp 16 | Ht 70.0 in | Wt 199.4 lb

## 2016-07-29 DIAGNOSIS — L723 Sebaceous cyst: Secondary | ICD-10-CM

## 2016-07-29 MED ORDER — LIDOCAINE HCL (PF) 1 % IJ SOLN: 2.0000 mL | Freq: Once | INTRAMUSCULAR | Status: DC

## 2016-07-29 NOTE — Progress Notes (Signed)
Date:  07/29/2016   Name:  Jeff Mcmahon   DOB:  [birth year]   MRN:  [id number]  PCP:  Adline Potter, MD    Chief Complaint: Cyst (Right side of neck. Redness and painful. Has had x 3 weeks and worsening. Also has one on back that has been there 3 years with no change. HX of removal of cyst 2012 and one on back broke in 2014 before surgery. )   History of Present Illness:  This is a 70 y.o. male with recurrent cyst R neck, worsening over past 3 weeks, now quite painful, requests incision/drainage and referral for removal. Also cyst L upper back asymptomatic.  Review of Systems:  Review of Systems  Constitutional: Negative for chills and fever.  HENT: Negative for trouble swallowing.   Neurological: Negative for weakness and light-headedness.    Patient Active Problem List   Diagnosis Date Noted  . Osteoarthritis 10/02/2015  . Prediabetes 09/09/2015  . Hx of colonic polyps 09/05/2015  . Hyperlipidemia 09/04/2015  . GERD (gastroesophageal reflux disease) 09/04/2015  . Dry eyes 09/04/2015    Prior to Admission medications   Medication Sig Start Date End Date Taking? Authorizing Provider  acetaminophen (TYLENOL) 325 MG tablet Take 650 mg by mouth every 6 (six) hours as needed.   Yes Historical Provider, MD  aspirin 81 MG tablet Take 81 mg by mouth daily.   Yes Historical Provider, MD  atorvastatin (LIPITOR) 10 MG tablet Take 1 tablet (10 mg total) by mouth daily. 07/13/16  Yes Adline Potter, MD  carboxymethylcellulose (REFRESH PLUS) 0.5 % SOLN 1 drop 3 (three) times daily as needed.   Yes Historical Provider, MD  Glucosamine-Chondroitin 1500-1200 MG/30ML LIQD Take by mouth.   Yes Historical Provider, MD  Multiple Vitamins-Minerals (MENS 50+ MULTI VITAMIN/MIN PO) Take by mouth.   Yes Historical Provider, MD  Omega-3 Fatty Acids (RA FISH OIL) 1400 MG CPDR Take by mouth.   Yes Historical Provider, MD    No Known Allergies  Past Surgical History:  Procedure Laterality Date  . NO  PAST SURGERIES      Social History  Substance Use Topics  . Smoking status: Never Smoker  . Smokeless tobacco: Never Used  . Alcohol use 2.4 oz/week    4 Glasses of wine per week     Comment: occasional    Family History  Problem Relation Age of Onset  . Lymphoma Mother 39  . Hypertension Mother   . Cancer Father   . Alcohol abuse Father   . Mental illness Father   . Heart disease Brother     Medication list has been reviewed and updated.  Physical Examination: BP 140/80   Pulse 78   Temp 97.7 F (36.5 C)   Resp 16   Ht 5\' 10"  (1.778 m)   Wt 199 lb 6.4 oz (90.4 kg)   SpO2 96%   BMI 28.61 kg/m   Physical Exam  Constitutional: He appears well-developed and well-nourished.  Skin:  Inflamed cystic lesion R lateral neck with mild surrounding erythema Benign appearing cystic lesion L upper back  Nursing note and vitals reviewed.   Assessment and Plan:  1. Inflamed sebaceous cyst After alcohol prep, lesion infused with 1cc 1% lidocaine and excised with #15 blade producing ~2cc sebaceous drainage without purulence. Pt tolerated procedure well with minimal bleeding.  - Ambulatory referral to General Surgery  2. Med review Consider d/c asa and fish oil next visit as no clear indication for use  No Follow-up  on file.  [name]. Bryer Gottsch, [facility]  07/29/2016

## 2016-07-29 NOTE — Patient Instructions (Signed)
Incision and Drainage, Care After  Refer to this sheet in the next few weeks. These instructions provide you with information about caring for yourself after your procedure. Your health care provider may also give you more specific instructions. Your treatment has been planned according to current medical practices, but problems sometimes occur. Call your health care provider if you have any problems or questions after your procedure.  What can I expect after the procedure?  After the procedure, it is common to have:  · Pain or discomfort around your incision site.  · Drainage from your incision.     Follow these instructions at home:  ·   · Take over-the-counter and prescription medicines only as told by your health care provider.  · If you were prescribed an antibiotic medicine, take it as told by your health care provider. Do not stop taking the antibiotic even if you start to feel better.  · Follow instructions from your health care provider about:  ? How to take care of your incision.  ? When and how you should change your packing and bandage (dressing). Wash your hands with soap and water before you change your dressing. If soap and water are not available, use hand sanitizer.  ? When you should remove your dressing.  · Do not take baths, swim, or use a hot tub until your health care provider approves.  · Keep all follow-up visits as told by your health care provider. This is important.  · Check your incision area every day for signs of infection. Check for:  ? More redness, swelling, or pain.  ? More fluid or blood.  ? Warmth.  ? Pus or a bad smell.  Contact a health care provider if:  · Your cyst or abscess returns.  · You have a fever.  · You have more redness, swelling, or pain around your incision.  · You have more fluid or blood coming from your incision.  · Your incision feels warm to the touch.  · You have pus or a bad smell coming from your incision.  Get help right away if:  · You have severe pain or  bleeding.  · You cannot eat or drink without vomiting.  · You have decreased urine output.  · You become short of breath.  · You have chest pain.  · You cough up blood.  · The area where the incision and drainage occurred becomes numb or it tingles.  This information is not intended to replace advice given to you by your health care provider. Make sure you discuss any questions you have with your health care provider.  Document Released: 10/18/2011 Document Revised: 12/26/2015 Document Reviewed: 05/16/2015  Elsevier Interactive Patient Education © 2017 Elsevier Inc.   

## 2016-08-05 ENCOUNTER — Other Ambulatory Visit: Payer: Self-pay

## 2016-08-05 ENCOUNTER — Telehealth: Payer: Self-pay

## 2016-08-05 NOTE — Telephone Encounter (Signed)
Pt called saying that you ordered a "thromosis test" on him according to my chart. He didn't get one and is wanting to know what's going on. I saw the I & D but couldn't help him further.

## 2016-08-05 NOTE — Telephone Encounter (Signed)
Not sure what he is talking about. Is there a way I can see what he sees on MyChart?

## 2016-08-06 ENCOUNTER — Ambulatory Visit (INDEPENDENT_AMBULATORY_CARE_PROVIDER_SITE_OTHER): Payer: Medicare Other | Admitting: Surgery

## 2016-08-06 ENCOUNTER — Encounter: Payer: Self-pay | Admitting: Surgery

## 2016-08-06 VITALS — BP 158/80 | HR 65 | Temp 97.5°F | Ht 70.0 in | Wt 201.8 lb

## 2016-08-06 DIAGNOSIS — L723 Sebaceous cyst: Secondary | ICD-10-CM

## 2016-08-06 NOTE — Patient Instructions (Signed)
We will call you over the next few days to discuss your surgery information. Please call our office if you have any questions or concerns.

## 2016-08-06 NOTE — Addendum Note (Signed)
Addended by: Theresia Majors A on: 08/06/2016 05:01 PM   Modules accepted: Orders

## 2016-08-06 NOTE — Telephone Encounter (Signed)
Done. Will have to call Mychart and see why this is showing as a clotting procedure in his [facility]

## 2016-08-06 NOTE — Telephone Encounter (Signed)
Can you call this patient for Dr Vicente Masson and tell him Dr Vicente Masson doesn't understand what he is seeing on my chart. Inform Plonk on what he says please

## 2016-08-06 NOTE — Progress Notes (Signed)
Surgical Consultation  08/06/2016  Jeff Mcmahon is an 70 y.o. male.   CC: Sebaceous cyst of the neck  HPI: This a patient with a sebaceous cyst of the neck it was drained last week but was not infected and was not placed on antibiotics. It is resolving but he wishes to have it removed. Of note he had a prior excision of a sebaceous cyst in the same area many years ago.  He also states that he had a draining cyst on his back in 2012 which has given him no trouble since then and was never excised. He questions if that needs to be removed as well  He is a retired Arts administrator from [place] does not smoke or drink No family history of significant disease.  Past Medical History:  Diagnosis Date  . GERD (gastroesophageal reflux disease)   . Hyperlipidemia   . Hypertension     Past Surgical History:  Procedure Laterality Date  . NO PAST SURGERIES      Family History  Problem Relation Age of Onset  . Lymphoma Mother 26  . Hypertension Mother   . Cancer Father   . Alcohol abuse Father   . Mental illness Father   . Heart disease Brother     Social History:  reports that he has never smoked. He has never used smokeless tobacco. He reports that he drinks about 2.4 oz of alcohol per week . He reports that he does not use drugs.  Allergies: No Known Allergies  Medications reviewed.   Review of Systems:   Review of Systems  Constitutional: Negative.   HENT: Negative.   Eyes: Negative.   Respiratory: Negative.   Cardiovascular: Negative.   Gastrointestinal: Negative.   Genitourinary: Negative.   Musculoskeletal: Negative.   Skin: Negative.   Neurological: Negative.   Endo/Heme/Allergies: Negative.   Psychiatric/Behavioral: Negative.      Physical Exam:  BP (!) 158/80   Pulse 65   Temp 97.5 F (36.4 C) (Oral)   Ht 5\' 10"  (1.778 m)   Wt 201 lb 12.8 oz (91.5 kg)   BMI 28.96 kg/m   Physical Exam  Constitutional: He is oriented to person, place,  and time and well-developed, well-nourished, and in no distress. No distress.  HENT:  Head: Normocephalic and atraumatic.  Right side of the neck shows a small puncture wound from drainage. There is no erythema there is an underlying mass measuring approximately 1 cm which is nontender no purulence noted  Eyes: Pupils are equal, round, and reactive to light. Right eye exhibits no discharge. Left eye exhibits no discharge. No scleral icterus.  Neck: Normal range of motion.  Cardiovascular: Normal rate, regular rhythm and normal heart sounds.   Pulmonary/Chest: Effort normal and breath sounds normal. No respiratory distress.  Abdominal: Soft. There is no tenderness.  Musculoskeletal: Normal range of motion. He exhibits no edema or tenderness.  Lymphadenopathy:    He has no cervical adenopathy.  Neurological: He is alert and oriented to person, place, and time.  Skin: Skin is warm and dry. He is not diaphoretic. No erythema.  3 cm mass in the midline of the back near a scar (mole removal). No erythema no drainage no tenderness  Psychiatric: Mood and affect normal.  Vitals reviewed.     No results found for this or any previous visit (from the past 48 hour(s)). No results found.  Assessment/Plan:  #1 sebaceous cyst with recent drainage procedure performed on the right neck. Recommend excision  due to its recent enlargement and drainage procedure. The risk of recurrence was discussed as was the risk of bleeding infection cosmetic deformity.  #2 sebaceous cyst of the midline back. Discussed options with him and has been infected in the past and drained but has not given him any trouble in several years. I offered removal at the same time since this would be done in the operating room and we can do both at the same time. Risks were the same and reviewed. He understood and agreed with this plan  Florene Glen, MD, FACS

## 2016-08-11 ENCOUNTER — Telehealth: Payer: Self-pay | Admitting: Surgery

## 2016-08-11 NOTE — Telephone Encounter (Signed)
Pt advised of pre op date/time and sx date. Sx: 09/07/16 with Dr Youlanda Mighty of mass on neck and midback.  Pre op: 08/31/16 between 1-5:00pm--Phone.   Patient made aware to call [phone], between 1-3:00pm the day before surgery, to find out what time to arrive.

## 2016-08-19 ENCOUNTER — Other Ambulatory Visit: Payer: Self-pay

## 2016-08-25 ENCOUNTER — Other Ambulatory Visit: Payer: Self-pay

## 2016-08-25 DIAGNOSIS — K439 Ventral hernia without obstruction or gangrene: Secondary | ICD-10-CM

## 2016-08-26 ENCOUNTER — Ambulatory Visit: Admit: 2016-08-26 | Payer: Self-pay | Admitting: Surgery

## 2016-08-26 SURGERY — EXCISION, MASS, NECK
Anesthesia: Monitor Anesthesia Care | Laterality: Right

## 2016-08-31 ENCOUNTER — Telehealth: Payer: Self-pay

## 2016-08-31 DIAGNOSIS — Z01818 Encounter for other preprocedural examination: Secondary | ICD-10-CM | POA: Diagnosis not present

## 2016-08-31 NOTE — Patient Instructions (Signed)
  Your procedure is scheduled on: 09-07-16 (TUESDAY) Report to Same Day Surgery 2nd floor medical mall [facility] Entrance-take elevator on left to 2nd floor.  Check in with surgery information desk.) To find out your arrival time please call [phone] between 1PM - 3PM on 09-06-16 [facility])  Remember: Instructions that are not followed completely may result in serious medical risk, up to and including death, or upon the discretion of your surgeon and anesthesiologist your surgery may need to be rescheduled.    _x___ 1. Do not eat food or drink liquids after midnight. No gum chewing or hard candies.     __x__ 2. No Alcohol for 24 hours before or after surgery.   __x__3. No Smoking for 24 prior to surgery.   ____  4. Bring all medications with you on the day of surgery if instructed.    __x__ 5. Notify your doctor if there is any change in your medical condition     (cold, fever, infections).     Do not wear jewelry, make-up, hairpins, clips or nail polish.  Do not wear lotions, powders, or perfumes. You may wear deodorant.  Do not shave 48 hours prior to surgery. Men may shave face and neck.  Do not bring valuables to the hospital.    [facility] is not responsible for any belongings or valuables.               Contacts, dentures or bridgework may not be worn into surgery.  Leave your suitcase in the car. After surgery it may be brought to your room.  For patients admitted to the hospital, discharge time is determined by your treatment team.   Patients discharged the day of surgery will not be allowed to drive home.  You will need someone to drive you home and stay with you the night of your procedure.    Please read over the following fact sheets that you were given:   [facility] Preparing for Surgery and or MRSA Information   ____ Take these medicines the morning of surgery with A SIP OF WATER:    1. NONE  2.  3.  4.  5.  6.  ____Fleets enema or Magnesium Citrate as  directed.   _x___ Use CHG Soap or sage wipes as directed on instruction sheet   ____ Use inhalers on the day of surgery and bring to hospital day of surgery  ____ Stop metformin 2 days prior to surgery    ____ Take 1/2 of usual insulin dose the night before surgery and none on the morning of surgery.   _X___ Stop Aspirin, Coumadin, Pllavix ,Eliquis, Effient, or Pradaxa-LAST DOSE OF ASPIRIN ON 09-01-16 PER DR COOPERS OFFICE  x__ Stop Anti-inflammatories such as Advil, Aleve, Ibuprofen, Motrin, Naproxen,          Naprosyn, Goodies powders or aspirin products NOW-Ok to take Tylenol.   _X___ Stop supplements until after surgery-STOP GLUCOSAMINE-CHONDROITIN AND FISH OIL NOW  ____ Bring C-Pap to the hospital.

## 2016-08-31 NOTE — Telephone Encounter (Signed)
Patient scheduled for surgery 09/07/16. He will need to be off Aspirin 5 days prior to surgery. His last dose should be on 09/01/16. He will hold medication from 09/02/16 until date of surgery and then instructions to resume will be given afterwards.  Patient has been informed of this information. He verbalizes understanding of this.

## 2016-09-01 ENCOUNTER — Encounter
Admission: RE | Admit: 2016-09-01 | Discharge: 2016-09-01 | Disposition: A | Discharge status: Home / Self Care | Payer: Medicare Other | Source: Ambulatory Visit | Attending: Surgery | Admitting: Surgery

## 2016-09-01 DIAGNOSIS — Z01812 Encounter for preprocedural laboratory examination: Secondary | ICD-10-CM | POA: Diagnosis not present

## 2016-09-01 DIAGNOSIS — Z01810 Encounter for preprocedural cardiovascular examination: Secondary | ICD-10-CM | POA: Insufficient documentation

## 2016-09-01 HISTORY — DX: Prediabetes: R73.03

## 2016-09-01 HISTORY — DX: Cardiac arrhythmia, unspecified: I49.9

## 2016-09-01 HISTORY — DX: Unspecified osteoarthritis, unspecified site: M19.90

## 2016-09-01 LAB — BASIC METABOLIC PANEL
ANION GAP: 7 (ref 5–15)
BUN: 17 mg/dL (ref 6–20)
CHLORIDE: 102 mmol/L (ref 101–111)
CO2: 27 mmol/L (ref 22–32)
CREATININE: 0.79 mg/dL (ref 0.61–1.24)
Calcium: 8.9 mg/dL (ref 8.9–10.3)
GFR calc non Af Amer: >60 mL/min (ref >60)
GLUCOSE: 98 mg/dL (ref 65–99)
Potassium: 4.1 mmol/L (ref 3.5–5.1)
Sodium: 136 mmol/L (ref 135–145)

## 2016-09-07 ENCOUNTER — Encounter: Payer: Self-pay | Admitting: *Deleted

## 2016-09-07 ENCOUNTER — Encounter
Admission: RE | Disposition: A | Discharge status: Home / Self Care | Payer: Self-pay | Source: Ambulatory Visit | Attending: Surgery

## 2016-09-07 ENCOUNTER — Ambulatory Visit: Payer: Medicare Other | Admitting: Certified Registered Nurse Anesthetist

## 2016-09-07 ENCOUNTER — Ambulatory Visit
Admission: RE | Admit: 2016-09-07 | Discharge: 2016-09-07 | Disposition: A | Discharge status: Home / Self Care | Payer: Medicare Other | Source: Ambulatory Visit | Attending: Surgery | Admitting: Surgery

## 2016-09-07 DIAGNOSIS — R222 Localized swelling, mass and lump, trunk: Secondary | ICD-10-CM

## 2016-09-07 DIAGNOSIS — R221 Localized swelling, mass and lump, neck: Secondary | ICD-10-CM

## 2016-09-07 DIAGNOSIS — L578 Other skin changes due to chronic exposure to nonionizing radiation: Secondary | ICD-10-CM | POA: Diagnosis not present

## 2016-09-07 DIAGNOSIS — L720 Epidermal cyst: Secondary | ICD-10-CM | POA: Diagnosis not present

## 2016-09-07 DIAGNOSIS — E785 Hyperlipidemia, unspecified: Secondary | ICD-10-CM | POA: Insufficient documentation

## 2016-09-07 DIAGNOSIS — L923 Foreign body granuloma of the skin and subcutaneous tissue: Secondary | ICD-10-CM | POA: Diagnosis not present

## 2016-09-07 DIAGNOSIS — Z79899 Other long term (current) drug therapy: Secondary | ICD-10-CM | POA: Insufficient documentation

## 2016-09-07 DIAGNOSIS — L723 Sebaceous cyst: Secondary | ICD-10-CM

## 2016-09-07 DIAGNOSIS — Z7982 Long term (current) use of aspirin: Secondary | ICD-10-CM | POA: Insufficient documentation

## 2016-09-07 DIAGNOSIS — L728 Other follicular cysts of the skin and subcutaneous tissue: Secondary | ICD-10-CM | POA: Diagnosis not present

## 2016-09-07 HISTORY — PX: EXCISION MASS NECK: SHX6703

## 2016-09-07 HISTORY — PX: EXCISION OF BACK LESION: SHX6597

## 2016-09-07 LAB — GLUCOSE, CAPILLARY: Glucose-Capillary: 89 mg/dL (ref 65–99)

## 2016-09-07 SURGERY — EXCISION, MASS, NECK
Anesthesia: General | Wound class: Clean

## 2016-09-07 MED ORDER — FAMOTIDINE 20 MG PO TABS
ORAL_TABLET | ORAL | Status: AC
  Filled 2016-09-07: qty 1

## 2016-09-07 MED ORDER — FENTANYL CITRATE (PF) 100 MCG/2ML IJ SOLN
INTRAMUSCULAR | Status: DC | PRN
  Administered 2016-09-07: 100 ug via INTRAVENOUS

## 2016-09-07 MED ORDER — HYDROCODONE-ACETAMINOPHEN 5-300 MG PO TABS: 1.0000 | ORAL_TABLET | ORAL | 0 refills | Status: DC | PRN

## 2016-09-07 MED ORDER — FAMOTIDINE 20 MG PO TABS
ORAL_TABLET | ORAL | Status: AC
  Administered 2016-09-07: 20 mg via ORAL
  Filled 2016-09-07: qty 1

## 2016-09-07 MED ORDER — LACTATED RINGERS IV SOLN
INTRAVENOUS | Status: DC | PRN
  Administered 2016-09-07: 10:00:00 via INTRAVENOUS

## 2016-09-07 MED ORDER — CHLORHEXIDINE GLUCONATE CLOTH 2 % EX PADS: 6.0000 | MEDICATED_PAD | Freq: Once | CUTANEOUS | Status: DC

## 2016-09-07 MED ORDER — BUPIVACAINE-EPINEPHRINE (PF) 0.25% -1:200000 IJ SOLN
INTRAMUSCULAR | Status: AC
  Filled 2016-09-07: qty 30

## 2016-09-07 MED ORDER — LIDOCAINE HCL (CARDIAC) 20 MG/ML IV SOLN
INTRAVENOUS | Status: DC | PRN
  Administered 2016-09-07: 80 mg via INTRAVENOUS

## 2016-09-07 MED ORDER — SUGAMMADEX SODIUM 200 MG/2ML IV SOLN
INTRAVENOUS | Status: DC | PRN
  Administered 2016-09-07: 180 mg via INTRAVENOUS

## 2016-09-07 MED ORDER — MIDAZOLAM HCL 2 MG/2ML IJ SOLN
INTRAMUSCULAR | Status: DC | PRN
  Administered 2016-09-07: 2 mg via INTRAVENOUS

## 2016-09-07 MED ORDER — MIDAZOLAM HCL 2 MG/2ML IJ SOLN
INTRAMUSCULAR | Status: AC
  Filled 2016-09-07: qty 2

## 2016-09-07 MED ORDER — FAMOTIDINE 20 MG PO TABS
20.0000 mg | ORAL_TABLET | Freq: Once | ORAL | Status: AC
  Administered 2016-09-07: 20 mg via ORAL

## 2016-09-07 MED ORDER — PROPOFOL 10 MG/ML IV BOLUS
INTRAVENOUS | Status: DC | PRN
  Administered 2016-09-07: 150 mg via INTRAVENOUS

## 2016-09-07 MED ORDER — FENTANYL CITRATE (PF) 100 MCG/2ML IJ SOLN: 25.0000 ug | INTRAMUSCULAR | Status: DC | PRN

## 2016-09-07 MED ORDER — CEFAZOLIN SODIUM-DEXTROSE 2-4 GM/100ML-% IV SOLN
INTRAVENOUS | Status: AC
  Administered 2016-09-07: 2 g via INTRAVENOUS
  Filled 2016-09-07: qty 100

## 2016-09-07 MED ORDER — ONDANSETRON HCL 4 MG/2ML IJ SOLN
INTRAMUSCULAR | Status: DC | PRN
  Administered 2016-09-07: 4 mg via INTRAVENOUS

## 2016-09-07 MED ORDER — FENTANYL CITRATE (PF) 100 MCG/2ML IJ SOLN
INTRAMUSCULAR | Status: AC
  Filled 2016-09-07: qty 2

## 2016-09-07 MED ORDER — ROCURONIUM BROMIDE 100 MG/10ML IV SOLN
INTRAVENOUS | Status: DC | PRN
  Administered 2016-09-07: 30 mg via INTRAVENOUS

## 2016-09-07 MED ORDER — SODIUM CHLORIDE 0.9 % IV SOLN
INTRAVENOUS | Status: DC
  Administered 2016-09-07: 50 mL/h via INTRAVENOUS

## 2016-09-07 MED ORDER — BUPIVACAINE-EPINEPHRINE (PF) 0.25% -1:200000 IJ SOLN
INTRAMUSCULAR | Status: DC | PRN
  Administered 2016-09-07: 28 mL

## 2016-09-07 MED ORDER — KETOROLAC TROMETHAMINE 30 MG/ML IJ SOLN
INTRAMUSCULAR | Status: DC | PRN
  Administered 2016-09-07: 15 mg via INTRAVENOUS

## 2016-09-07 MED ORDER — CEFAZOLIN SODIUM-DEXTROSE 2-4 GM/100ML-% IV SOLN
2.0000 g | INTRAVENOUS | Status: AC
  Administered 2016-09-07: 2 g via INTRAVENOUS

## 2016-09-07 MED ORDER — ONDANSETRON HCL 4 MG/2ML IJ SOLN: 4.0000 mg | Freq: Once | INTRAMUSCULAR | Status: DC | PRN

## 2016-09-07 MED ORDER — EPHEDRINE SULFATE 50 MG/ML IJ SOLN
INTRAMUSCULAR | Status: DC | PRN
  Administered 2016-09-07 (×2): 10 mg via INTRAVENOUS
  Administered 2016-09-07: 5 mg via INTRAVENOUS

## 2016-09-07 MED ORDER — PHENYLEPHRINE HCL 10 MG/ML IJ SOLN
INTRAMUSCULAR | Status: DC | PRN
  Administered 2016-09-07: 200 ug via INTRAVENOUS
  Administered 2016-09-07: 150 ug via INTRAVENOUS
  Administered 2016-09-07: 200 ug via INTRAVENOUS

## 2016-09-07 SURGICAL SUPPLY — 39 items
BLADE SURG 15 STRL LF DISP TIS (BLADE) ×3 IMPLANT
BLADE SURG 15 STRL SS (BLADE) ×6
CANISTER SUCT 1200ML W/VALVE (MISCELLANEOUS) ×3 IMPLANT
CHLORAPREP W/TINT 26ML (MISCELLANEOUS) ×3 IMPLANT
DERMABOND ADVANCED (GAUZE/BANDAGES/DRESSINGS) ×2
DERMABOND ADVANCED .7 DNX12 (GAUZE/BANDAGES/DRESSINGS) ×1 IMPLANT
DRAIN PENROSE 1/4X12 LTX (DRAIN) IMPLANT
DRAPE LAPAROTOMY 100X77 ABD (DRAPES) ×3 IMPLANT
DRAPE SHEET LG 3/4 BI-LAMINATE (DRAPES) ×3 IMPLANT
ELECT CAUTERY BLADE 6.4 (BLADE) ×3 IMPLANT
ELECT REM PT RETURN 9FT ADLT (ELECTROSURGICAL) ×3
ELECTRODE REM PT RTRN 9FT ADLT (ELECTROSURGICAL) ×1 IMPLANT
GAUZE SPONGE 4X4 12PLY STRL (GAUZE/BANDAGES/DRESSINGS) IMPLANT
GLOVE BIO SURGEON STRL SZ8 (GLOVE) ×15 IMPLANT
GOWN STRL REUS W/ TWL LRG LVL3 (GOWN DISPOSABLE) ×3 IMPLANT
GOWN STRL REUS W/TWL LRG LVL3 (GOWN DISPOSABLE) ×6
KIT RM TURNOVER STRD PROC AR (KITS) ×3 IMPLANT
LABEL OR SOLS (LABEL) ×3 IMPLANT
MARGIN MAP 10MM (MISCELLANEOUS) IMPLANT
NEEDLE HYPO 22GX1.5 SAFETY (NEEDLE) ×3 IMPLANT
NS IRRIG 500ML POUR BTL (IV SOLUTION) ×3 IMPLANT
PACK BASIN MINOR ARMC (MISCELLANEOUS) ×3 IMPLANT
PAD ABD DERMACEA PRESS 5X9 (GAUZE/BANDAGES/DRESSINGS) IMPLANT
SPONGE LAP 18X18 5 PK (GAUZE/BANDAGES/DRESSINGS) ×3 IMPLANT
SUT ETHILON 2 0 FS 18 (SUTURE) IMPLANT
SUT ETHILON 3-0 FS-10 30 BLK (SUTURE) ×6
SUT MNCRL 4-0 (SUTURE) ×2
SUT MNCRL 4-0 27XMFL (SUTURE) ×1
SUT VIC AB 2-0 CT1 27 (SUTURE) ×2
SUT VIC AB 2-0 CT1 TAPERPNT 27 (SUTURE) ×1 IMPLANT
SUT VIC AB 2-0 CT2 27 (SUTURE) ×3 IMPLANT
SUT VIC AB 3-0 SH 27 (SUTURE) ×4
SUT VIC AB 3-0 SH 27X BRD (SUTURE) ×2 IMPLANT
SUTURE EHLN 3-0 FS-10 30 BLK (SUTURE) ×2 IMPLANT
SUTURE MNCRL 4-0 27XMF (SUTURE) ×1 IMPLANT
SYR 20CC LL (SYRINGE) ×3 IMPLANT
SYR BULB EAR ULCER 3OZ GRN STR (SYRINGE) ×3 IMPLANT
SYRINGE 10CC LL (SYRINGE) ×3 IMPLANT
TOWEL OR 17X26 4PK STRL BLUE (TOWEL DISPOSABLE) ×3 IMPLANT

## 2016-09-07 NOTE — Op Note (Signed)
09/07/2016  11:10 AM  PATIENT:  Jeff Mcmahon  71 y.o. male  PRE-OPERATIVE DIAGNOSIS:  #1 right neck mass #2 back mass  POST-OPERATIVE DIAGNOSIS:  Same  PROCEDURE: #1 excision of right neck mass #2 excision of back mass  SURGEON:  Florene Glen MD, FACS   ANESTHESIA:   Gen. with endotracheal tube   Details of Procedure: Patient with a right neck mass and back mass both of which R likely sebaceous cyst. Preoperative discussed rationale for surgery the options of observation risk bleeding infection recurrence cosmetic deformity open wound this is all reviewed for him in the preop holding area he understood and agreed to proceed.  She was discharged general anesthesia he was then placed in a well-padded left lateral recumbent position and prepped and draped in a sterile fashion. A surgical timeout was held.  Local anesthetic was infiltrated in the skin and subcutaneous tissues tissues around a previous I&D site in the right neck. A lenticular shaped incision was then performed excising an area of induration and likely cyst formation measuring approximately 1 cm area a 5 cm incision was then closed in layers in an intermediate fashion after ensuring that hemostasis was adequate with minimal electrocautery. The accessory spinal nerve was not in these area. The wound was closed in layers of 3-0 Vicryl followed by 40 septic or Monocryl and Dermabond was placed.  Attention was turned to the mid back mass which was infiltrated with Marcaine with epinephrine. A lenticular shaped incision was drawn out with a marking pen to encompass a visible pore and the large portion of the mass. The incision was then executed sharply and dissection down around a large sebaceous cyst was performed ensuring that all of the cyst wall was excised. This measured a proximally 2 cm and hadn't was excised via a 5 cm incision.  After ensuring that hemostasis was adequate, Closure was performed in a similar fashion but  with 30 Vicryls in a deep closure method in layers followed by interrupted horizontal mattress sutures of 3-0 nylon and running locking 3-0 nylon continuous suture.  Dressing was placed on this with paper tape.  Patient tolerated the procedure well the workup occasions he was taken to recovery room in stable condition to be discharged care of his family and follow-up in 10 days   Florene Glen, MD FACS

## 2016-09-07 NOTE — Anesthesia Preprocedure Evaluation (Signed)
Anesthesia Evaluation  Patient identified by MRN, date of birth, ID band Patient awake    Reviewed: Allergy & Precautions, H&P , NPO status , Patient's Chart, lab work & pertinent test results, reviewed documented beta blocker date and time   History of Anesthesia Complications Negative for: history of anesthetic complications  Airway Mallampati: III  TM Distance: >3 FB Neck ROM: full    Dental no notable dental hx. (+) Implants   Pulmonary neg shortness of breath, neg sleep apnea, neg COPD, Recent URI , Resolved,           Cardiovascular Exercise Tolerance: Good (-) hypertension(-) angina(-) CAD, (-) Past MI, (-) Cardiac Stents and (-) CABG + dysrhythmias (-) Valvular Problems/Murmurs     Neuro/Psych negative neurological ROS  negative psych ROS   GI/Hepatic Neg liver ROS, GERD  Controlled,  Endo/Other  negative endocrine ROS  Renal/GU negative Renal ROS  negative genitourinary   Musculoskeletal   Abdominal   Peds  Hematology negative hematology ROS (+)   Anesthesia Other Findings Past Medical History: No date: Arthritis 2007: Dysrhythmia     Comment: H/O IRREGULAR HEART BEAT-SAW CARDIOLOGIST AND               SAID IT WAS NOTHING TO BE CONCERNED WITH No date: GERD (gastroesophageal reflux disease)     Comment: H/O No date: Hyperlipidemia No date: Pre-diabetes   Reproductive/Obstetrics negative OB ROS                             Anesthesia Physical Anesthesia Plan  ASA: II  Anesthesia Plan: General   Post-op Pain Management:    Induction:   Airway Management Planned:   Additional Equipment:   Intra-op Plan:   Post-operative Plan:   Informed Consent: I have reviewed the patients History and Physical, chart, labs and discussed the procedure including the risks, benefits and alternatives for the proposed anesthesia with the patient or authorized representative who has  indicated his/her understanding and acceptance.   Dental Advisory Given  Plan Discussed with: Anesthesiologist, CRNA and Surgeon  Anesthesia Plan Comments:         Anesthesia Quick Evaluation

## 2016-09-07 NOTE — Transfer of Care (Signed)
Immediate Anesthesia Transfer of Care Note  Patient: Jeff Mcmahon  Procedure(s) Performed: Procedure(s): EXCISION MASS NECK (N/A) EXCISION OF BACK LESION (N/A)  Patient Location: PACU  Anesthesia Type:General  Level of Consciousness: awake, alert , oriented and patient cooperative  Airway & Oxygen Therapy: Patient Spontanous Breathing and Patient connected to nasal cannula oxygen  Post-op Assessment: Report given to RN, Post -op Vital signs reviewed and stable and Patient moving all extremities  Post vital signs: Reviewed and stable  Last Vitals:  Vitals:   09/07/16 1100  BP: 136/64  Pulse: 91  Resp: 16  Temp: 36.4 C    Last Pain:  Vitals:   09/07/16 1100  PainSc: Asleep         Complications: No apparent anesthesia complications

## 2016-09-07 NOTE — Anesthesia Post-op Follow-up Note (Cosign Needed)
Anesthesia QCDR form completed.        

## 2016-09-07 NOTE — Anesthesia Procedure Notes (Signed)
Procedure Name: Intubation Date/Time: 09/07/2016 10:08 AM Performed by: Rockne Coons Pre-anesthesia Checklist: Patient identified, Emergency Drugs available, Suction available, Patient being monitored and Timeout performed Patient Re-evaluated:Patient Re-evaluated prior to inductionOxygen Delivery Method: Circle system utilized Preoxygenation: Pre-oxygenation with 100% oxygen Intubation Type: IV induction Ventilation: Oral airway inserted - appropriate to patient size and Mask ventilation with difficulty Laryngoscope Size: 4 and McGraph Grade View: Grade I Tube type: Oral Tube size: 7.0 mm Number of attempts: 1 Airway Equipment and Method: Stylet and Oral airway Placement Confirmation: positive ETCO2,  CO2 detector and breath sounds checked- equal and bilateral Secured at: 24 cm Tube secured with: Tape Dental Injury: Teeth and Oropharynx as per pre-operative assessment  Difficulty Due To: Difficult Airway- due to anterior larynx Comments: Looked first with mac 4 standard blade. Grade 3 view. Decision to move to mcgrath to optimize visualization before ETT passage. Grade 1 view mcgrath. Still anterior curve (steep bend) needed on ETT>

## 2016-09-07 NOTE — Discharge Instructions (Signed)
Resume all home meds Remove dry dressing in 24 hours May shower in 24 hours Return to clinic in 2 weeks   [place]   1) The drugs that you were given will stay in your system until tomorrow so for the next 24 hours you should not:  A) Drive an automobile B) Make any legal decisions C) Drink any alcoholic beverage   2) You may resume regular meals tomorrow.  Today it is better to start with liquids and gradually work up to solid foods.  You may eat anything you prefer, but it is better to start with liquids, then soup and crackers, and gradually work up to solid foods.   3) Please notify your doctor immediately if you have any unusual bleeding, trouble breathing, redness and pain at the surgery site, drainage, fever, or pain not relieved by medication.    4) Additional Instructions: TAKE A STOOL SOFTENER TWICE A DAY WHILE TAKING NARCOTIC PAIN MEDICINE TO PREVENT CONSTIPATION   Please contact your physician with any problems or Same Day Surgery at [phone], Monday through Friday 6 am to 4 pm, or [place] at [facility] number at [phone].

## 2016-09-07 NOTE — H&P (Signed)
Jeff Mcmahon is an 71 y.o. male.    Chief Complaint: Neck and back mass  HPI: This patient with a history of previously infected neck and back masses his right neck mass was I&D recently and his back mass was infected several years ago but has continued to enlarge. He is here for elective excision of a right neck mass and a back mass  Past Medical History:  Diagnosis Date  . Arthritis   . Dysrhythmia 2007   H/O IRREGULAR HEART BEAT-SAW CARDIOLOGIST AND SAID IT WAS NOTHING TO BE CONCERNED WITH  . GERD (gastroesophageal reflux disease)    H/O  . Hyperlipidemia   . Pre-diabetes     Past Surgical History:  Procedure Laterality Date  . COLONOSCOPY    . ESOPHAGOGASTRODUODENOSCOPY    . MOUTH SURGERY     dental procedure    Family History  Problem Relation Age of Onset  . Lymphoma Mother 51  . Hypertension Mother   . Cancer Father   . Alcohol abuse Father   . Mental illness Father   . Heart disease Brother    Social History:  reports that he has never smoked. He has never used smokeless tobacco. He reports that he drinks about 3.0 oz of alcohol per week . He reports that he does not use drugs.  Allergies: No Known Allergies  Facility-Administered Medications Prior to Admission  Medication Dose Route Frequency Provider Last Rate Last Dose  . lidocaine (PF) (XYLOCAINE) 1 % injection 2 mL  2 mL Intradermal Once Adline Potter, MD       Medications Prior to Admission  Medication Sig Dispense Refill  . aspirin 81 MG tablet Take 81 mg by mouth daily.    Marland Kitchen atorvastatin (LIPITOR) 10 MG tablet Take 1 tablet (10 mg total) by mouth daily. (Patient taking differently: Take 10 mg by mouth daily at 6 PM. ) 90 tablet 3  . carboxymethylcellulose (REFRESH PLUS) 0.5 % SOLN Place 1 drop into both eyes 3 (three) times daily as needed (dry eyes).     . Glucosamine-Chondroitin 1500-1200 MG/30ML LIQD Take 1 tablet by mouth daily.     . Multiple Vitamins-Minerals (MENS 50+ MULTI VITAMIN/MIN PO)  Take 1 tablet by mouth daily.     . Omega-3 Fatty Acids (RA FISH OIL) 1400 MG CPDR Take 1,400 mg by mouth daily.     . psyllium (METAMUCIL) 0.52 g capsule Take 0.52 g by mouth daily.    . sodium chloride (OCEAN) 0.65 % SOLN nasal spray Place 1 spray into both nostrils as needed for congestion.       Review of Systems  All other systems reviewed and are negative.    Physical Exam:  There were no vitals taken for this visit.  Physical Exam  Constitutional: He is oriented to person, place, and time and well-developed, well-nourished, and in no distress.  HENT:  Head: Normocephalic and atraumatic.  Right neck scar with minimal underlying induration  Patient marked  Eyes: Pupils are equal, round, and reactive to light. Right eye exhibits no discharge. Left eye exhibits no discharge. No scleral icterus.  Neck: Normal range of motion.  Cardiovascular: Normal rate and regular rhythm.   Pulmonary/Chest: Effort normal. No respiratory distress.  Abdominal: Soft. He exhibits no distension.  Musculoskeletal: Normal range of motion. He exhibits no edema.  Lymphadenopathy:    He has no cervical adenopathy.  Neurological: He is alert and oriented to person, place, and time.  Skin: Skin is warm and dry.  No rash noted. No erythema.  Psychiatric: Mood and affect normal.  Vitals reviewed.       Results for orders placed or performed during the hospital encounter of 09/07/16 (from the past 48 hour(s))  Glucose, capillary     Status: None   Collection Time: 09/07/16  8:41 AM  Result Value Ref Range   Glucose-Capillary 89 65 - 99 mg/dL   No results found.   Assessment/Plan  This patient with a right neck mass and a posterior back mass. Both of been infected in the past and likely represent sebaceous cyst. Plan is for excision of a right neck mass and a back mass. Both of been marked in the preop holding area.  I discussed with him the rationale for offering surgery the options of  observation and the risks of bleeding infection recurrence cosmetic deformity and closure methods of absorbable versus nonabsorbable sutures. Postoperative care was discussed with him as well. He understood and agreed to proceed.  Florene Glen, MD, FACS

## 2016-09-08 ENCOUNTER — Encounter: Payer: Self-pay | Admitting: Surgery

## 2016-09-08 LAB — SURGICAL PATHOLOGY

## 2016-09-09 NOTE — Anesthesia Postprocedure Evaluation (Signed)
Anesthesia Post Note  Patient: Bartley Vowles  Procedure(s) Performed: Procedure(s) (LRB): EXCISION MASS NECK (N/A) EXCISION OF BACK LESION (N/A)  Patient location during evaluation: PACU Anesthesia Type: General Level of consciousness: awake and alert Pain management: pain level controlled Vital Signs Assessment: post-procedure vital signs reviewed and stable Respiratory status: spontaneous breathing, nonlabored ventilation, respiratory function stable and patient connected to nasal cannula oxygen Cardiovascular status: blood pressure returned to baseline and stable Postop Assessment: no signs of nausea or vomiting Anesthetic complications: no     Last Vitals:  Vitals:   09/07/16 1143 09/07/16 1209  BP: (!) 143/94 124/63  Pulse: 85 72  Resp: 12 12  Temp: 36.2 C     Last Pain:  Vitals:   09/08/16 1656  TempSrc:   PainSc: 0-No pain                 Martha Clan

## 2016-09-21 ENCOUNTER — Ambulatory Visit (INDEPENDENT_AMBULATORY_CARE_PROVIDER_SITE_OTHER): Payer: Medicare Other | Admitting: General Surgery

## 2016-09-21 ENCOUNTER — Encounter: Payer: Self-pay | Admitting: General Surgery

## 2016-09-21 VITALS — BP 144/80 | HR 86 | Temp 97.3°F | Wt 203.0 lb

## 2016-09-21 DIAGNOSIS — Z4889 Encounter for other specified surgical aftercare: Secondary | ICD-10-CM

## 2016-09-21 NOTE — Progress Notes (Signed)
Outpatient Surgical Follow Up  09/21/2016  Jeff Mcmahon is an 71 y.o. male.   Chief Complaint  Patient presents with  . Routine Post Op    EXCISION MASS NECK Dr. Burt Knack 09/07/2016    HPI: 71 year old male returns to clinic 2 weeks status post excision of neck and back mass. Patient primarily reports itching to both sites. He has not required any pain medication. He denies any fevers, chills, nausea, vomiting, chest pain, shortness of breath, diarrhea, constipation. His only complaint of discomfort is when he tries to sit back on his sofa and pressure is applied to the back site. He's had no drainage from either site.  Past Medical History:  Diagnosis Date  . Arthritis   . Dysrhythmia 2007   H/O IRREGULAR HEART BEAT-SAW CARDIOLOGIST AND SAID IT WAS NOTHING TO BE CONCERNED WITH  . GERD (gastroesophageal reflux disease)    H/O  . Hyperlipidemia   . Pre-diabetes     Past Surgical History:  Procedure Laterality Date  . COLONOSCOPY    . ESOPHAGOGASTRODUODENOSCOPY    . EXCISION MASS NECK N/A 09/07/2016   Procedure: EXCISION MASS NECK;  Surgeon: Florene Glen, MD;  Location: [facility] ORS;  Service: General;  Laterality: N/A;  . EXCISION OF BACK LESION N/A 09/07/2016   Procedure: EXCISION OF BACK LESION;  Surgeon: Florene Glen, MD;  Location: [facility] ORS;  Service: General;  Laterality: N/A;  . MOUTH SURGERY     dental procedure    Family History  Problem Relation Age of Onset  . Lymphoma Mother 56  . Hypertension Mother   . Cancer Father   . Alcohol abuse Father   . Mental illness Father   . Heart disease Brother     Social History:  reports that he has never smoked. He has never used smokeless tobacco. He reports that he drinks about 3.0 oz of alcohol per week . He reports that he does not use drugs.  Allergies: No Known Allergies  Medications reviewed.    ROS A multipoint review of systems was completed. All pertinent positives and negatives are documented within the  history of present illness the remainder are negative.   BP (!) 144/80   Pulse 86   Temp 97.3 F (36.3 C) (Oral)   Wt 92.1 kg (203 lb)   BMI 29.13 kg/m   Physical Exam Gen.: No acute distress Chest: Clear to auscultation Heart: Regular rhythm Abdomen: Soft and nontender Skin: Right neck excision site well approximated without any erythema or drainage. Mid back excision site with sutures in place with scabbing center portion of the excision site. No evidence of erythema or drainage.    No results found for this or any previous visit (from the past 48 hour(s)). No results found.  Assessment/Plan:  1. Aftercare following surgery 71 year old male status post excisions of back mass and right neck mass. Pathology reviewed with the patient. Sutures removed today and replaced with Steri-Strips. Counseled him as to appropriate return to activities. He will follow-up in clinic in 1 week for additional wound check by his operative surgeon prior to being cleared to return to all normal activities.     Clayburn Pert, MD FACS General Surgeon  09/21/2016,10:06 AM

## 2016-09-21 NOTE — Patient Instructions (Signed)
Please call our office with any questions or concerns.  Please do not submerge in a tub, hot tub, or pool until incisions are completely sealed.  Use sun block to incision area over the next year if this area will be exposed to sun. This helps decrease scarring.  At that time- Listen to your body when lifting, if you have pain when lifting, stop and then try again in a few days. Pain after doing exercises or activities of daily living is normal as you get back in to your normal routine.  If you develop redness, drainage, or pain at incision sites- call our office immediately and speak with a nurse.  

## 2016-09-29 ENCOUNTER — Ambulatory Visit (INDEPENDENT_AMBULATORY_CARE_PROVIDER_SITE_OTHER): Payer: Medicare Other | Admitting: Surgery

## 2016-09-29 ENCOUNTER — Encounter: Payer: Self-pay | Admitting: Surgery

## 2016-09-29 VITALS — BP 133/74 | HR 76 | Temp 97.9°F | Wt 203.0 lb

## 2016-09-29 DIAGNOSIS — L723 Sebaceous cyst: Secondary | ICD-10-CM | POA: Diagnosis not present

## 2016-09-29 NOTE — Patient Instructions (Addendum)
Please call our office with any questions or concerns.  Please do not submerge in a tub, hot tub, or pool until incisions are completely sealed.  Use sun block to incision area over the next year if this area will be exposed to sun. This helps decrease scarring.  At that time- Listen to your body when lifting, if you have pain when lifting, stop and then try again in a few days. Pain after doing exercises or activities of daily living is normal as you get back in to your normal routine.  If you develop redness, drainage, or pain at incision sites- call our office immediately and speak with a nurse.  At this time you are able to play golf with no restrictions.

## 2016-09-29 NOTE — Progress Notes (Signed)
Outpatient postop visit  09/29/2016  Jeff Mcmahon is an 71 y.o. male.    Procedure: Excision of back mass and neck mass  CC: No complaints  HPI: This patient underwent the excision of a neck mass which had been previously infected as well as a back mass. Pathology confirmed both of these were benign conditions. He has no problems with his wound and eschared fallen off in the shower earlier today. He wants to play golf Medications reviewed.    Physical Exam:  BP 133/74   Pulse 76   Temp 97.9 F (36.6 C) (Oral)   Wt 203 lb (92.1 kg)   BMI 29.13 kg/m     PE: Neck wound is healing well no erythema no drainage and barely visible in a skin crease.  Back wound has small eschar measuring approximately 3 mm otherwise wound is healing well with no erythema or drainage    Assessment/Plan:  Pathology reviewed. Patient doing very well recommend follow up on an as-needed basis reminded of sunscreen and how low or vitamin E cream  Florene Glen, MD, FACS

## 2017-01-11 ENCOUNTER — Ambulatory Visit: Payer: Medicare Other | Admitting: Family Medicine

## 2017-01-12 ENCOUNTER — Encounter: Payer: Self-pay | Admitting: Family Medicine

## 2017-01-12 ENCOUNTER — Ambulatory Visit (INDEPENDENT_AMBULATORY_CARE_PROVIDER_SITE_OTHER): Payer: Medicare Other | Admitting: Family Medicine

## 2017-01-12 VITALS — BP 128/80 | HR 60 | Ht 70.0 in | Wt 198.0 lb

## 2017-01-12 DIAGNOSIS — E785 Hyperlipidemia, unspecified: Secondary | ICD-10-CM

## 2017-01-12 DIAGNOSIS — Z23 Encounter for immunization: Secondary | ICD-10-CM

## 2017-01-12 DIAGNOSIS — M533 Sacrococcygeal disorders, not elsewhere classified: Secondary | ICD-10-CM

## 2017-01-12 DIAGNOSIS — R7303 Prediabetes: Secondary | ICD-10-CM | POA: Diagnosis not present

## 2017-01-12 DIAGNOSIS — M150 Primary generalized (osteo)arthritis: Secondary | ICD-10-CM | POA: Diagnosis not present

## 2017-01-12 DIAGNOSIS — M159 Polyosteoarthritis, unspecified: Secondary | ICD-10-CM

## 2017-01-12 MED ORDER — ZOSTER VAC RECOMB ADJUVANTED 50 MCG/0.5ML IM SUSR: 0.5000 mL | Freq: Once | INTRAMUSCULAR | 1 refills | Status: AC

## 2017-01-12 MED ORDER — NAPROXEN 500 MG PO TABS: 500.0000 mg | ORAL_TABLET | Freq: Two times a day (BID) | ORAL | 0 refills | Status: DC

## 2017-01-13 LAB — LIPID PANEL
CHOL/HDL RATIO: 3.1 ratio (ref 0.0–5.0)
CHOLESTEROL TOTAL: 135 mg/dL (ref 100–199)
HDL: 44 mg/dL (ref >39)
LDL CALC: 76 mg/dL (ref 0–99)
TRIGLYCERIDES: 76 mg/dL (ref 0–149)
VLDL Cholesterol Cal: 15 mg/dL (ref 5–40)

## 2017-01-13 LAB — HEMOGLOBIN A1C
Est. average glucose Bld gHb Est-mCnc: 111 mg/dL
Hgb A1c MFr Bld: 5.5 % (ref 4.8–5.6)

## 2017-01-13 NOTE — Progress Notes (Signed)
Date:  01/12/2017   Name:  Jeff Mcmahon   DOB:  September 09, 1945   MRN:  [id number]  PCP:  Adline Potter, MD    Chief Complaint: Follow-up (6 month) and Tailbone Pain (feels a sharp pain when trying to stand up from a sitting position x 2 month- once in standing position- goes away)   History of Present Illness:  This is a 71 y.o. male seen for six month f/u. Never stopped asa/fish oil/statin, wants to continue. C/o coccyx pain when standing. Requests zoster imm.   Review of Systems:  Review of Systems  Constitutional: Negative for chills and fever.  Respiratory: Negative for cough and shortness of breath.   Cardiovascular: Negative for chest pain and leg swelling.  Genitourinary: Negative for difficulty urinating.  Neurological: Negative for syncope and light-headedness.    Patient Active Problem List   Diagnosis Date Noted  . Neck mass   . Mass of skin of back   . Osteoarthritis 10/02/2015  . Prediabetes 09/09/2015  . Hx of colonic polyps 09/05/2015  . Hyperlipidemia 09/04/2015  . GERD (gastroesophageal reflux disease) 09/04/2015  . Dry eyes 09/04/2015    Prior to Admission medications   Medication Sig Start Date End Date Taking? Authorizing Provider  aspirin 81 MG tablet Take 81 mg by mouth daily.   Yes [provider]  atorvastatin (LIPITOR) 10 MG tablet Take 1 tablet (10 mg total) by mouth daily. Patient taking differently: Take 10 mg by mouth daily at 6 PM.  07/13/16  Yes Labella Zahradnik, Gwyndolyn Saxon, MD  carboxymethylcellulose (REFRESH PLUS) 0.5 % SOLN Place 1 drop into both eyes 3 (three) times daily as needed (dry eyes).    Yes [provider]  Glucosamine-Chondroitin 1500-1200 MG/30ML LIQD Take 1 tablet by mouth daily.    Yes [provider]  Multiple Vitamins-Minerals (MENS 50+ MULTI VITAMIN/MIN PO) Take 1 tablet by mouth daily.    Yes [provider]  psyllium (METAMUCIL) 0.52 g capsule Take 0.52 g by mouth daily.   Yes [provider]   sodium chloride (OCEAN) 0.65 % SOLN nasal spray Place 1 spray into both nostrils as needed for congestion.   Yes [provider]  naproxen (NAPROSYN) 500 MG tablet Take 1 tablet (500 mg total) by mouth 2 (two) times daily with a meal. 01/12/17   Adline Potter, MD    No Known Allergies  Past Surgical History:  Procedure Laterality Date  . COLONOSCOPY    . ESOPHAGOGASTRODUODENOSCOPY    . EXCISION MASS NECK N/A 09/07/2016   Procedure: EXCISION MASS NECK;  Surgeon: Florene Glen, MD;  Location: [facility] OR[facility];  Service: General;  Laterality: N/A;  . EXCISION OF BACK LESION N/A 09/07/2016   Procedure: EXCISION OF BACK LESION;  Surgeon: Florene Glen, MD;  Location: [facility] OR[facility];  Service: General;  Laterality: N/A;  . MOUTH SURGERY     dental procedure    Social History  Substance Use Topics  . Smoking status: Never Smoker  . Smokeless tobacco: Never Used  . Alcohol use 3.0 oz/week    5 Glasses of wine per week     Comment: occasional    Family History  Problem Relation Age of Onset  . Lymphoma Mother 50  . Hypertension Mother   . Cancer Father   . Alcohol abuse Father   . Mental illness Father   . Heart disease Brother     Medication list has been reviewed and updated.  Physical Examination: BP 128/80   Pulse  60   Ht 5\' 10"  (1.778 m)   Wt 198 lb (89.8 kg)   BMI 28.41 kg/m   Physical Exam  Constitutional: He appears well-developed and well-nourished.  Cardiovascular: Normal rate, regular rhythm and normal heart sounds.   Pulmonary/Chest: Effort normal and breath sounds normal.  Musculoskeletal: He exhibits no edema.  Neurological: He is alert.  Skin: Skin is warm and dry.  Psychiatric: He has a normal mood and affect. His behavior is normal.  Nursing note and vitals reviewed.   Assessment and Plan:  1. Coccyalgia Naprosyn x 2 wks only, call if sxs worsen/persist  2. Prediabetes - HgB A1c  3. Hyperlipidemia, unspecified hyperlipidemia type - Lipid  Profile  4. Primary osteoarthritis involving multiple joints Cont G/C, consider Tylenol bid  5. Need for zoster vaccination - Zoster Vac Recomb Adjuvanted [facility]) injection; Inject 0.5 mLs into the muscle once.  Dispense: 0.5 mL; Refill: 1  Return in about 6 months (around 07/14/2017).  [name]. [facility]  01/13/2017  Addendum: CVR 15%, will continue statin/asa but d/c fish oil

## 2017-02-21 ENCOUNTER — Ambulatory Visit (INDEPENDENT_AMBULATORY_CARE_PROVIDER_SITE_OTHER): Payer: Medicare Other

## 2017-02-21 VITALS — BP 124/78 | HR 58 | Temp 98.2°F | Ht 70.0 in | Wt 200.0 lb

## 2017-02-21 DIAGNOSIS — Z0000 Encounter for general adult medical examination without abnormal findings: Secondary | ICD-10-CM | POA: Diagnosis not present

## 2017-02-21 NOTE — Patient Instructions (Addendum)
[name] , Thank you for taking time to come for your Medicare Wellness Visit. I appreciate your ongoing commitment to your health goals. Please review the following plan we discussed and let me know if I can assist you in the future.   Screening recommendations/referrals: Colonoscopy: completed 05/08/2015 Recommended yearly ophthalmology/optometry visit for glaucoma screening and checkup Recommended yearly dental visit for hygiene and checkup  Vaccinations: Influenza vaccine: up to date, due 04/2017 Pneumococcal vaccine: up to date  Tdap vaccine: up to date Shingles vaccine: up to date   Advanced directives: Copy on file, you requested no changes to be made at this time.   Conditions/risks identified: Recommend continue drinking at least 7-8 glasses of water a day  Next appointment: Follow up on 07/19/2017 at 9:00am with Dr.Plonk. Follow up in one year for your annual wellness exam.  Preventive Care 65 Years and Older, Male Preventive care refers to lifestyle choices and visits with your health care provider that can promote health and wellness. What does preventive care include?  A yearly physical exam. This is also called an annual well check.  Dental exams once or twice a year.  Routine eye exams. Ask your health care provider how often you should have your eyes checked.  Personal lifestyle choices, including:  Daily care of your teeth and gums.  Regular physical activity.  Eating a healthy diet.  Avoiding tobacco and drug use.  Limiting alcohol use.  Practicing safe sex.  Taking low doses of aspirin every day.  Taking vitamin and mineral supplements as recommended by your health care provider. What happens during an annual well check? The services and screenings done by your health care provider during your annual well check will depend on your age, overall health, lifestyle risk factors, and family history of disease. Counseling  Your health care provider may  ask you questions about your:  Alcohol use.  Tobacco use.  Drug use.  Emotional well-being.  Home and relationship well-being.  Sexual activity.  Eating habits.  History of falls.  Memory and ability to understand (cognition).  Work and work Statistician. Screening  You may have the following tests or measurements:  Height, weight, and BMI.  Blood pressure.  Lipid and cholesterol levels. These may be checked every 5 years, or more frequently if you are over 29 years old.  Skin check.  Lung cancer screening. You may have this screening every year starting at age 67 if you have a 30-pack-year history of smoking and currently smoke or have quit within the past 15 years.  Fecal occult blood test (FOBT) of the stool. You may have this test every year starting at age 59.  Flexible sigmoidoscopy or colonoscopy. You may have a sigmoidoscopy every 5 years or a colonoscopy every 10 years starting at age 43.  Prostate cancer screening. Recommendations will vary depending on your family history and other risks.  Hepatitis C blood test.  Hepatitis B blood test.  Sexually transmitted disease (STD) testing.  Diabetes screening. This is done by checking your blood sugar (glucose) after you have not eaten for a while (fasting). You may have this done every 1-3 years.  Abdominal aortic aneurysm (AAA) screening. You may need this if you are a current or former smoker.  Osteoporosis. You may be screened starting at age 90 if you are at high risk. Talk with your health care provider about your test results, treatment options, and if necessary, the need for more tests. Vaccines  Your health care  provider may recommend certain vaccines, such as:  Influenza vaccine. This is recommended every year.  Tetanus, diphtheria, and acellular pertussis (Tdap, Td) vaccine. You may need a Td booster every 10 years.  Zoster vaccine. You may need this after age 47.  Pneumococcal 13-valent  conjugate (PCV13) vaccine. One dose is recommended after age 31.  Pneumococcal polysaccharide (PPSV23) vaccine. One dose is recommended after age 50. Talk to your health care provider about which screenings and vaccines you need and how often you need them. This information is not intended to replace advice given to you by your health care provider. Make sure you discuss any questions you have with your health care provider. Document Released: 08/22/2015 Document Revised: 04/14/2016 Document Reviewed: 05/27/2015 Elsevier Interactive Patient Education  2017 [place] Prevention in the Home Falls can cause injuries. They can happen to people of all ages. There are many things you can do to make your home safe and to help prevent falls. What can I do on the outside of my home?  Regularly fix the edges of walkways and driveways and fix any cracks.  Remove anything that might make you trip as you walk through a door, such as a raised step or threshold.  Trim any bushes or trees on the path to your home.  Use bright outdoor lighting.  Clear any walking paths of anything that might make someone trip, such as rocks or tools.  Regularly check to see if handrails are loose or broken. Make sure that both sides of any steps have handrails.  Any raised decks and porches should have guardrails on the edges.  Have any leaves, snow, or ice cleared regularly.  Use sand or salt on walking paths during winter.  Clean up any spills in your garage right away. This includes oil or grease spills. What can I do in the bathroom?  Use night lights.  Install grab bars by the toilet and in the tub and shower. Do not use towel bars as grab bars.  Use non-skid mats or decals in the tub or shower.  If you need to sit down in the shower, use a plastic, non-slip stool.  Keep the floor dry. Clean up any water that spills on the floor as soon as it happens.  Remove soap buildup in the tub or  shower regularly.  Attach bath mats securely with double-sided non-slip rug tape.  Do not have throw rugs and other things on the floor that can make you trip. What can I do in the bedroom?  Use night lights.  Make sure that you have a light by your bed that is easy to reach.  Do not use any sheets or blankets that are too big for your bed. They should not hang down onto the floor.  Have a firm chair that has side arms. You can use this for support while you get dressed.  Do not have throw rugs and other things on the floor that can make you trip. What can I do in the kitchen?  Clean up any spills right away.  Avoid walking on wet floors.  Keep items that you use a lot in easy-to-reach places.  If you need to reach something above you, use a strong step stool that has a grab bar.  Keep electrical cords out of the way.  Do not use floor polish or wax that makes floors slippery. If you must use wax, use non-skid floor wax.  Do not have throw  rugs and other things on the floor that can make you trip. What can I do with my stairs?  Do not leave any items on the stairs.  Make sure that there are handrails on both sides of the stairs and use them. Fix handrails that are broken or loose. Make sure that handrails are as long as the stairways.  Check any carpeting to make sure that it is firmly attached to the stairs. Fix any carpet that is loose or worn.  Avoid having throw rugs at the top or bottom of the stairs. If you do have throw rugs, attach them to the floor with carpet tape.  Make sure that you have a light switch at the top of the stairs and the bottom of the stairs. If you do not have them, ask someone to add them for you. What else can I do to help prevent falls?  Wear shoes that:  Do not have high heels.  Have rubber bottoms.  Are comfortable and fit you well.  Are closed at the toe. Do not wear sandals.  If you use a stepladder:  Make sure that it is fully  opened. Do not climb a closed stepladder.  Make sure that both sides of the stepladder are locked into place.  Ask someone to hold it for you, if possible.  Clearly mark and make sure that you can see:  Any grab bars or handrails.  First and last steps.  Where the edge of each step is.  Use tools that help you move around (mobility aids) if they are needed. These include:  Canes.  Walkers.  Scooters.  Crutches.  Turn on the lights when you go into a dark area. Replace any light bulbs as soon as they burn out.  Set up your furniture so you have a clear path. Avoid moving your furniture around.  If any of your floors are uneven, fix them.  If there are any pets around you, be aware of where they are.  Review your medicines with your doctor. Some medicines can make you feel dizzy. This can increase your chance of falling. Ask your doctor what other things that you can do to help prevent falls. This information is not intended to replace advice given to you by your health care provider. Make sure you discuss any questions you have with your health care provider. Document Released: 05/22/2009 Document Revised: 01/01/2016 Document Reviewed: 08/30/2014 Elsevier Interactive Patient Education  2017 [employer].

## 2017-02-21 NOTE — Progress Notes (Signed)
Subjective:   Jeff Mcmahon is a 71 y.o. male who presents for Medicare Annual/Subsequent preventive examination.  Review of Systems:   Cardiac Risk Factors include: male gender;advanced age (>53men, >62 women);dyslipidemia     Objective:    Vitals: BP 124/78 (BP Location: Left Arm, Patient Position: Sitting)   Pulse (!) 58   Temp 98.2 F (36.8 C)   Ht 5\' 10"  (1.778 m)   Wt 200 lb (90.7 kg)   BMI 28.70 kg/m   Body mass index is 28.7 kg/m.  Tobacco History  Smoking Status  . Never Smoker  Smokeless Tobacco  . Never Used     Counseling given: Not Answered   Past Medical History:  Diagnosis Date  . Arthritis   . Dysrhythmia 2007   H/O IRREGULAR HEART BEAT-SAW CARDIOLOGIST AND SAID IT WAS NOTHING TO BE CONCERNED WITH  . GERD (gastroesophageal reflux disease)    H/O  . Hyperlipidemia   . Pre-diabetes    Past Surgical History:  Procedure Laterality Date  . COLONOSCOPY    . ESOPHAGOGASTRODUODENOSCOPY    . EXCISION MASS NECK N/A 09/07/2016   Procedure: EXCISION MASS NECK;  Surgeon: Florene Glen, MD;  Location: [facility] ORS;  Service: General;  Laterality: N/A;  . EXCISION OF BACK LESION N/A 09/07/2016   Procedure: EXCISION OF BACK LESION;  Surgeon: Florene Glen, MD;  Location: [facility] OR[facility];  Service: General;  Laterality: N/A;  . MOUTH SURGERY     dental procedure   Family History  Problem Relation Age of Onset  . Lymphoma Mother 53  . Hypertension Mother   . Cancer Father   . Alcohol abuse Father   . Mental illness Father   . Heart disease Brother    History  Sexual Activity  . Sexual activity: Not on file    Outpatient Encounter Prescriptions as of 02/21/2017  Medication Sig  . aspirin 81 MG tablet Take 81 mg by mouth daily.  Marland Kitchen atorvastatin (LIPITOR) 10 MG tablet Take 1 tablet (10 mg total) by mouth daily. (Patient taking differently: Take 10 mg by mouth daily at 6 PM. )  . carboxymethylcellulose (REFRESH PLUS) 0.5 % SOLN Place 1 drop into both eyes 3  (three) times daily as needed (dry eyes).   . Glucosamine-Chondroitin 1500-1200 MG/30ML LIQD Take 1 tablet by mouth daily.   . Multiple Vitamins-Minerals (MENS 50+ MULTI VITAMIN/MIN PO) Take 1 tablet by mouth daily.   . Omega-3 Fatty Acids (EQL OMEGA 3 FISH OIL) 1400 MG CAPS Take by mouth.  . psyllium (METAMUCIL) 0.52 g capsule Take 0.52 g by mouth daily.  . sodium chloride (OCEAN) 0.65 % SOLN nasal spray Place 1 spray into both nostrils as needed for congestion.  . [DISCONTINUED] naproxen (NAPROSYN) 500 MG tablet Take 1 tablet (500 mg total) by mouth 2 (two) times daily with a meal. (Patient not taking: Reported on 02/21/2017)   No facility-administered encounter medications on file as of 02/21/2017.     Activities of Daily Living In your present state of health, do you have any difficulty performing the following activities: 02/21/2017 08/31/2016  Hearing? N N  Vision? N N  Difficulty concentrating or making decisions? N N  Walking or climbing stairs? N N  Dressing or bathing? N N  Doing errands, shopping? N N  Preparing Food and eating ? N -  Using the Toilet? N -  In the past six months, have you accidently leaked urine? N -  Do you have problems with loss  of bowel control? N -  Managing your Medications? N -  Managing your Finances? N -  Housekeeping or managing your Housekeeping? N -  Some recent data might be hidden    Patient Care Team: Adline Potter, MD as PCP - General (Family Medicine)   Assessment:     Exercise Activities and Dietary recommendations Current Exercise Habits: Home exercise routine, Type of exercise: walking, Time (Minutes): 60, Frequency (Times/Week): 6, Weekly Exercise (Minutes/Week): 360, Intensity: Mild  Goals    . Increase water intake          Recommend continue drinking at least 7-8 glasses of water a day      Fall Risk Fall Risk  02/21/2017 01/12/2017 12/30/2015 09/04/2015  Falls in the past year? No No No No   Depression Screen PHQ 2/9  Scores 02/21/2017 01/12/2017 12/30/2015 09/04/2015  PHQ - 2 Score 0 0 0 0    Cognitive Function     6CIT Screen 02/21/2017  What Year? 0 points  What month? 0 points  What time? 0 points  Count back from 20 0 points  Months in reverse 0 points  Repeat phrase 0 points  Total Score 0    Immunization History  Administered Date(s) Administered  . Influenza-Unspecified 04/10/2015  . Tdap 08/09/2010  . Zoster 09/04/2015   Screening Tests Health Maintenance  Topic Date Due  . INFLUENZA VACCINE  03/09/2017  . TETANUS/TDAP  08/09/2020  . COLONOSCOPY  05/07/2025  . Hepatitis C Screening  Completed  . PNA vac Low Risk Adult  Completed      Plan:  I have personally reviewed and addressed the Medicare Annual Wellness questionnaire and have noted the following in the patient's chart:  A. Medical and social history B. Use of alcohol, tobacco or illicit drugs  C. Current medications and supplements D. Functional ability and status E.  Nutritional status F.  Physical activity G. Advance directives H. List of other physicians I.  Hospitalizations, surgeries, and ER visits in previous 12 months J.  [place] such as hearing and vision if needed, cognitive and depression L. Referrals and appointments  In addition, I have reviewed and discussed with patient certain preventive protocols, quality metrics, and best practice recommendations. A written personalized care plan for preventive services as well as general preventive health recommendations were provided to patient.   Signed,  Tyler Aas, LPN Nurse Health Advisor   MD Recommendations:none

## 2017-03-07 DIAGNOSIS — X32XXXA Exposure to sunlight, initial encounter: Secondary | ICD-10-CM | POA: Diagnosis not present

## 2017-03-07 DIAGNOSIS — B360 Pityriasis versicolor: Secondary | ICD-10-CM | POA: Diagnosis not present

## 2017-03-07 DIAGNOSIS — L570 Actinic keratosis: Secondary | ICD-10-CM | POA: Diagnosis not present

## 2017-03-07 DIAGNOSIS — L821 Other seborrheic keratosis: Secondary | ICD-10-CM | POA: Diagnosis not present

## 2017-03-07 DIAGNOSIS — D2261 Melanocytic nevi of right upper limb, including shoulder: Secondary | ICD-10-CM | POA: Diagnosis not present

## 2017-03-07 DIAGNOSIS — D225 Melanocytic nevi of trunk: Secondary | ICD-10-CM | POA: Diagnosis not present

## 2017-05-30 ENCOUNTER — Ambulatory Visit (INDEPENDENT_AMBULATORY_CARE_PROVIDER_SITE_OTHER): Payer: Medicare Other | Admitting: Family Medicine

## 2017-05-30 ENCOUNTER — Encounter: Payer: Self-pay | Admitting: Family Medicine

## 2017-05-30 VITALS — BP 122/78 | HR 76 | Temp 97.9°F | Resp 16 | Ht 70.0 in | Wt 197.6 lb

## 2017-05-30 DIAGNOSIS — R7303 Prediabetes: Secondary | ICD-10-CM | POA: Diagnosis not present

## 2017-05-30 DIAGNOSIS — E785 Hyperlipidemia, unspecified: Secondary | ICD-10-CM

## 2017-05-30 DIAGNOSIS — M159 Polyosteoarthritis, unspecified: Secondary | ICD-10-CM

## 2017-05-30 DIAGNOSIS — M150 Primary generalized (osteo)arthritis: Secondary | ICD-10-CM | POA: Diagnosis not present

## 2017-05-30 DIAGNOSIS — A084 Viral intestinal infection, unspecified: Secondary | ICD-10-CM

## 2017-05-30 MED ORDER — LOPERAMIDE HCL 2 MG PO CAPS: 2.0000 mg | ORAL_CAPSULE | ORAL | 0 refills | Status: DC | PRN

## 2017-05-30 NOTE — Patient Instructions (Signed)

## 2017-05-31 NOTE — Progress Notes (Signed)
Date:  05/30/2017   Name:  Jeff Mcmahon   DOB:  November 09, 1945   MRN:  [id number]  PCP:  Adline Potter, MD    Chief Complaint: Diarrhea (5 days -[name] was sick with cold and this started as cold like symptoms. Taking Max dose for diarrhea. ) and Cough (Cold symptoms since Wed. No fever. )   History of Present Illness:  This is a 71 y.o. male seen for same day visit, c/o URI sxs and cough x 4d, now with diarrhea x 2d with slight nausea, no voimiting. Chills 2d ago but none since. Pepto-Bismol helping some.  Review of Systems:  Review of Systems  Constitutional: Negative for fever.  HENT: Negative for trouble swallowing.   Respiratory: Negative for shortness of breath and wheezing.   Cardiovascular: Negative for chest pain and leg swelling.  Gastrointestinal: Negative for abdominal pain and blood in stool.  Genitourinary: Negative for difficulty urinating.  Neurological: Negative for syncope and light-headedness.    Patient Active Problem List   Diagnosis Date Noted  . Osteoarthritis 10/02/2015  . Prediabetes 09/09/2015  . Hx of colonic polyps 09/05/2015  . Hyperlipidemia 09/04/2015  . GERD (gastroesophageal reflux disease) 09/04/2015  . Dry eyes 09/04/2015    Prior to Admission medications   Medication Sig Start Date End Date Taking? Authorizing Provider  aspirin 81 MG tablet Take 81 mg by mouth daily.   Yes [provider]  atorvastatin (LIPITOR) 10 MG tablet Take 1 tablet (10 mg total) by mouth daily. Patient taking differently: Take 10 mg by mouth daily at 6 PM.  07/13/16  Yes Talayla Doyel, Gwyndolyn Saxon, MD  carboxymethylcellulose (REFRESH PLUS) 0.5 % SOLN Place 1 drop into both eyes 3 (three) times daily as needed (dry eyes).    Yes [provider]  Multiple Vitamins-Minerals (MENS 50+ MULTI VITAMIN/MIN PO) Take 1 tablet by mouth daily.    Yes [provider]  Omega-3 Fatty Acids (EQL OMEGA 3 FISH OIL) 1400 MG CAPS Take by mouth.   Yes [provider]  psyllium (METAMUCIL) 0.52 g capsule Take 0.52 g by mouth daily.   Yes [provider]  sodium chloride (OCEAN) 0.65 % SOLN nasal spray Place 1 spray into both nostrils as needed for congestion.   Yes [provider]  loperamide (IMODIUM A-D) 2 MG capsule Take 1 capsule (2 mg total) by mouth as needed for diarrhea or loose stools. 05/30/17   Adline Potter, MD    No Known Allergies  Past Surgical History:  Procedure Laterality Date  . COLONOSCOPY    . ESOPHAGOGASTRODUODENOSCOPY    . EXCISION MASS NECK N/A 09/07/2016   Procedure: EXCISION MASS NECK;  Surgeon: Florene Glen, MD;  Location: [facility];  Service: General;  Laterality: N/A;  . EXCISION OF BACK LESION N/A 09/07/2016   Procedure: EXCISION OF BACK LESION;  Surgeon: Florene Glen, MD;  Location: [facility];  Service: General;  Laterality: N/A;  . MOUTH SURGERY     dental procedure    Social History  Substance Use Topics  . Smoking status: Never Smoker  . Smokeless tobacco: Never Used  . Alcohol use 3.0 oz/week    5 Glasses of wine per week    Family History  Problem Relation Age of Onset  . Lymphoma Mother 78  . Hypertension Mother   . Cancer Father   . Alcohol abuse Father   . Mental illness Father   . Heart disease Brother     Medication list has  been reviewed and updated.  Physical Examination: BP 122/78   Pulse 76   Temp 97.9 F (36.6 C) (Oral)   Resp 16   Ht 5\' 10"  (1.778 m)   Wt 197 lb 9.6 oz (89.6 kg)   SpO2 98%   BMI 28.35 kg/m   Physical Exam  Constitutional: He appears well-developed and well-nourished.  HENT:  Right Ear: External ear normal.  Left Ear: External ear normal.  Mouth/Throat: Oropharynx is clear and moist. No oropharyngeal exudate.  TMs clear  Neck: Neck supple.  Cardiovascular: Normal rate, regular rhythm and normal heart sounds.   Pulmonary/Chest: Effort normal and breath sounds normal.  Abdominal: Soft. Bowel sounds are normal. He exhibits no  distension. There is no tenderness.  Musculoskeletal: He exhibits no edema.  Lymphadenopathy:    He has no cervical adenopathy.  Neurological: He is alert.  Skin: Skin is warm and dry.  Psychiatric: He has a normal mood and affect. His behavior is normal.  Nursing note and vitals reviewed.   Assessment and Plan:  1. Viral gastroenteritis OTC Imodium prn, encourage PO fluids  2. Primary osteoarthritis involving multiple joints May stop G/C, use Tylenol prn pain  3. Hyperlipidemia, unspecified hyperlipidemia type Well controlled on Lipitor/asa  4. Prediabetes Well controlled, last a1c 5.5% in June  Return if symptoms worsen or fail to improve.  [name]. [facility]  05/31/2017

## 2017-07-19 ENCOUNTER — Other Ambulatory Visit: Payer: Self-pay

## 2017-07-19 ENCOUNTER — Ambulatory Visit: Payer: Medicare Other | Admitting: Family Medicine

## 2017-07-19 MED ORDER — ATORVASTATIN CALCIUM 10 MG PO TABS: 10.0000 mg | ORAL_TABLET | Freq: Every day | ORAL | 3 refills | Status: DC

## 2017-07-22 ENCOUNTER — Encounter: Payer: Self-pay | Admitting: Family Medicine

## 2017-07-22 ENCOUNTER — Ambulatory Visit (INDEPENDENT_AMBULATORY_CARE_PROVIDER_SITE_OTHER): Payer: Medicare Other | Admitting: Family Medicine

## 2017-07-22 VITALS — BP 115/74 | HR 78 | Resp 16 | Ht 70.0 in | Wt 197.4 lb

## 2017-07-22 DIAGNOSIS — K219 Gastro-esophageal reflux disease without esophagitis: Secondary | ICD-10-CM

## 2017-07-22 DIAGNOSIS — M150 Primary generalized (osteo)arthritis: Secondary | ICD-10-CM

## 2017-07-22 DIAGNOSIS — R7303 Prediabetes: Secondary | ICD-10-CM

## 2017-07-22 DIAGNOSIS — M159 Polyosteoarthritis, unspecified: Secondary | ICD-10-CM

## 2017-07-22 DIAGNOSIS — E785 Hyperlipidemia, unspecified: Secondary | ICD-10-CM

## 2017-07-22 NOTE — Progress Notes (Signed)
Date:  07/22/2017   Name:  Jeff Mcmahon   DOB:  25-Mar-1946   MRN:  [id number]  PCP:  Adline Potter, MD    Chief Complaint: Gastroesophageal Reflux   History of Present Illness:  This is a 71 y.o. male seen for six month f/u. Restarted G/C for OA due to increased pain. Remains on Lipitor and asa for elevated CVR (15%). Asks about screening for prostate cancer.  Review of Systems:  Review of Systems  Constitutional: Negative for chills and fever.  Respiratory: Negative for cough and shortness of breath.   Cardiovascular: Negative for chest pain and leg swelling.  Genitourinary: Negative for difficulty urinating.  Neurological: Negative for syncope and light-headedness.    Patient Active Problem List   Diagnosis Date Noted  . Osteoarthritis 10/02/2015  . Prediabetes 09/09/2015  . Hx of colonic polyps 09/05/2015  . Hyperlipidemia 09/04/2015  . GERD (gastroesophageal reflux disease) 09/04/2015  . Dry eyes 09/04/2015    Prior to Admission medications   Medication Sig Start Date End Date Taking? Authorizing Provider  aspirin 81 MG tablet Take 81 mg by mouth daily.   Yes [provider]  atorvastatin (LIPITOR) 10 MG tablet Take 1 tablet (10 mg total) by mouth daily. 07/19/17  Yes Stanley Lyness, Gwyndolyn Saxon, MD  carboxymethylcellulose (REFRESH PLUS) 0.5 % SOLN Place 1 drop into both eyes 3 (three) times daily as needed (dry eyes).    Yes [provider]  Glucosamine-Chondroitin 1500-1200 MG/30ML LIQD Take by mouth.   Yes [provider]  sodium chloride (OCEAN) 0.65 % SOLN nasal spray Place 1 spray into both nostrils as needed for congestion.   Yes [provider]    No Known Allergies  Past Surgical History:  Procedure Laterality Date  . COLONOSCOPY    . ESOPHAGOGASTRODUODENOSCOPY    . EXCISION MASS NECK N/A 09/07/2016   Procedure: EXCISION MASS NECK;  Surgeon: Florene Glen, MD;  Location: [facility] OR[facility];  Service: General;  Laterality: N/A;  .  EXCISION OF BACK LESION N/A 09/07/2016   Procedure: EXCISION OF BACK LESION;  Surgeon: Florene Glen, MD;  Location: [facility];  Service: General;  Laterality: N/A;  . MOUTH SURGERY     dental procedure    Social History   Tobacco Use  . Smoking status: Never Smoker  . Smokeless tobacco: Never Used  Substance Use Topics  . Alcohol use: Yes    Alcohol/week: 3.0 oz    Types: 5 Glasses of wine per week  . Drug use: No    Family History  Problem Relation Age of Onset  . Lymphoma Mother 81  . Hypertension Mother   . Cancer Father   . Alcohol abuse Father   . Mental illness Father   . Heart disease Brother     Medication list has been reviewed and updated.  Physical Examination: BP 115/74   Pulse 78   Resp 16   Ht 5\' 10"  (1.778 m)   Wt 197 lb 6.4 oz (89.5 kg)   SpO2 98%   BMI 28.32 kg/m   Physical Exam  Constitutional: He appears well-developed and well-nourished.  Cardiovascular: Normal rate, regular rhythm and normal heart sounds.  Pulmonary/Chest: Effort normal and breath sounds normal.  Musculoskeletal: He exhibits no edema.  Neurological: He is alert.  Skin: Skin is warm and dry.  Psychiatric: He has a normal mood and affect. His behavior is normal.  Nursing note and vitals reviewed.   Assessment and Plan:  1. Primary osteoarthritis involving  multiple joints Cont G/C, Tylenol prn  2. Hyperlipidemia, unspecified hyperlipidemia type Well controlled on Lipitor - Lipid Profile  3. Gastroesophageal reflux disease, esophagitis presence not specified Well controlled off Zantac  4. Prediabetes - HgB A1c - Comprehensive Metabolic Panel (CMET) - CBC - TSH  5. Med review Unclear indication for asa, consider d/c Prostate cancer info given  Return in about 1 year (around 07/22/2018).  [name]. [facility]  07/22/2017

## 2017-07-22 NOTE — Patient Instructions (Signed)
Prostate Cancer The prostate is a walnut-sized gland that is involved in the production of semen. It is located below a man's bladder, in front of the rectum. Prostate cancer is the abnormal growth of cells in the prostate gland. What are the causes? The exact cause of this condition is not known. What increases the risk? This condition is more likely to develop in men who:  Are older than age 70.  Are African-American.  Are obese.  Have a family history of prostate cancer.  Have a family history of breast cancer.  What are the signs or symptoms? Symptoms of this condition include:  A need to urinate often.  Weak or interrupted flow of urine.  Trouble starting or stopping urination.  Inability to urinate.  Pain or burning during urination.  Painful ejaculation.  Blood in urine or semen.  Persistent pain or discomfort in the lower back, lower abdomen, hips, or upper thighs.  Trouble getting an erection.  Trouble emptying the bladder all the way.  How is this diagnosed? This condition can be diagnosed with:  A digital rectal exam. For this exam, a health care provider inserts a gloved finger into the rectum to feel the prostate gland.  A blood test called a prostate-specific antigen (PSA) test.  An imaging test called transrectal ultrasonography.  A procedure in which a sample of tissue is taken from the prostate and examined under a microscope (prostate biopsy).  Once the condition is diagnosed, tests will be done to determine how far the cancer has spread. This is called staging the cancer. Staging may involve imaging tests, such as:  A bone scan.  A CT scan.  A PET scan.  An MRI.  The stages of prostate cancer are as follows:  Stage I. At this stage, the cancer is found in the prostate only. The cancer is not visible on imaging tests and it is usually found by accident, such as during a prostate surgery.  Stage II. At this stage, the cancer is more  advanced than it is in stage I, but the cancer has not spread outside the prostate.  Stage III. At this stage, the cancer has spread beyond the outer layer of the prostate to nearby tissues. The cancer may be found in the seminal vesicles, which are near the bladder and the prostate.  Stage IV. At this stage, the cancer has spread other parts of the body, such as the lymph nodes, bones, bladder, rectum, liver, or lungs.  How is this treated? Treatment for this condition depends on several factors, including the stage of the cancer, your age, personal preferences, and your overall health. Talk with your health care provider about treatment options that are recommended for you. Common treatments include:  Observation for early stage prostate cancer (active surveillance). This involves having exams, blood tests, and in some cases, more biopsies. For some men, this is the only treatment needed.  Surgery. Types of surgeries include: ? Open surgery. In this surgery, a larger incision is made to remove the prostate. ? A laparoscopic prostatectomy. This is a surgery to remove the prostate and lymph nodes through several, small incisions. It is often referred to as a minimally invasive surgery. ? A robotic prostatectomy. This is a surgery to remove the prostate and lymph nodes with the help of a robotic arm that is controlled by a computer. ? Orchiectomy. This is a surgery to remove the testicles. ? Cryosurgery. This is a surgery to freeze and destroy cancer cells.    Radiation treatment. Types of radiation treatment include: ? External beam radiation. This type aims beams of radiation from outside the body at the prostate to destroy cancerous cells. ? Brachytherapy. This type uses radioactive needles, seeds, wires, or tubes that are implanted into the prostate gland. Like external beam radiation, brachytherapy destroys cancerous cells. An advantage is that this type of radiation limits the damage to  surrounding tissue and has fewer side effects.  High-intensity, focused ultrasonography. This treatment destroys cancer cells by delivering high-energy ultrasound waves to the cancerous cells.  Chemotherapy medicines. This treatment kills cancer cells or stops them from multiplying.  Hormone treatment. This treatment involves taking medicines that act on one of the male hormones (testosterone): ? By stopping your body from producing testosterone. ? By blocking testosterone from reaching cancer cells.  Follow these instructions at home:  Take over-the-counter and prescription medicines only as told by your health care provider.  Maintain a healthy diet.  Get plenty of sleep.  Consider joining a support group for men who have prostate cancer. Meeting with a support group may help you learn to cope with the stress of having cancer.  Keep all follow-up visits as told by your health care provider. This is important.  If you have to go to the hospital, notify your cancer specialist (oncologist).  Treatment for prostate cancer may affect sexual function. Continue to have intimate moments with your partner. This may include touching, holding, hugging, and caressing. Contact a health care provider if:  You have trouble urinating.  You have blood in your urine.  You have pain in your hips, back, or chest. Get help right away if:  You have weakness or numbness in your legs.  You have cannot control urination or your bowel movements (incontinence).  You have trouble breathing.  You have sudden chest pain.  You have chills or a fever. Summary  The prostate is a walnut-sized gland that is involved in the production of semen. It is located below a man's bladder, in front of the rectum. Prostate cancer is the abnormal growth of cells in the prostate gland.  Treatment for this condition depends on several factors, including the stage of the cancer, your age, personal preferences, and  your overall health. Talk with your health care provider about treatment options that are recommended for you.  Consider joining a support group for men who have prostate cancer. Meeting with a support group may help you learn to cope with the stress of having cancer. This information is not intended to replace advice given to you by your health care provider. Make sure you discuss any questions you have with your health care provider. Document Released: 07/26/2005 Document Revised: 04/06/2016 Document Reviewed: 04/05/2016 Elsevier Interactive Patient Education  2017 [employer]  

## 2017-07-23 LAB — CBC
HEMATOCRIT: 47.7 % (ref 37.5–51.0)
Hemoglobin: 16 g/dL (ref 13.0–17.7)
MCH: 32.1 pg (ref 26.6–33.0)
MCHC: 33.5 g/dL (ref 31.5–35.7)
MCV: 96 fL (ref 79–97)
Platelets: 236 10*3/uL (ref 150–379)
RBC: 4.99 x10E6/uL (ref 4.14–5.80)
RDW: 13.7 % (ref 12.3–15.4)
WBC: 5.3 10*3/uL (ref 3.4–10.8)

## 2017-07-23 LAB — COMPREHENSIVE METABOLIC PANEL
ALBUMIN: 4.7 g/dL (ref 3.5–4.8)
ALK PHOS: 104 IU/L (ref 39–117)
ALT: 23 IU/L (ref 0–44)
AST: 25 IU/L (ref 0–40)
Albumin/Globulin Ratio: 1.7 (ref 1.2–2.2)
BUN / CREAT RATIO: 16 (ref 10–24)
BUN: 15 mg/dL (ref 8–27)
Bilirubin Total: 0.4 mg/dL (ref 0.0–1.2)
CALCIUM: 9.3 mg/dL (ref 8.6–10.2)
CO2: 28 mmol/L (ref 20–29)
CREATININE: 0.92 mg/dL (ref 0.76–1.27)
Chloride: 100 mmol/L (ref 96–106)
GFR, EST AFRICAN AMERICAN: 97 mL/min/{1.73_m2} (ref >59)
GFR, EST NON AFRICAN AMERICAN: 84 mL/min/{1.73_m2} (ref >59)
GLUCOSE: 68 mg/dL (ref 65–99)
Globulin, Total: 2.8 g/dL (ref 1.5–4.5)
Potassium: 4.9 mmol/L (ref 3.5–5.2)
Sodium: 141 mmol/L (ref 134–144)
TOTAL PROTEIN: 7.5 g/dL (ref 6.0–8.5)

## 2017-07-23 LAB — LIPID PANEL
CHOL/HDL RATIO: 2.9 ratio (ref 0.0–5.0)
Cholesterol, Total: 132 mg/dL (ref 100–199)
HDL: 45 mg/dL (ref >39)
LDL CALC: 73 mg/dL (ref 0–99)
TRIGLYCERIDES: 68 mg/dL (ref 0–149)
VLDL Cholesterol Cal: 14 mg/dL (ref 5–40)

## 2017-07-23 LAB — HEMOGLOBIN A1C
Est. average glucose Bld gHb Est-mCnc: 114 mg/dL
Hgb A1c MFr Bld: 5.6 % (ref 4.8–5.6)

## 2017-07-23 LAB — TSH: TSH: 2.43 u[IU]/mL (ref 0.450–4.500)

## 2017-07-25 ENCOUNTER — Other Ambulatory Visit: Payer: Self-pay | Admitting: Family Medicine

## 2017-08-01 ENCOUNTER — Ambulatory Visit
Admission: EM | Admit: 2017-08-01 | Discharge: 2017-08-01 | Disposition: A | Discharge status: Home / Self Care | Payer: Medicare Other | Attending: Family Medicine | Admitting: Family Medicine

## 2017-08-01 ENCOUNTER — Encounter: Payer: Self-pay | Admitting: *Deleted

## 2017-08-01 ENCOUNTER — Other Ambulatory Visit: Payer: Self-pay

## 2017-08-01 DIAGNOSIS — J069 Acute upper respiratory infection, unspecified: Secondary | ICD-10-CM | POA: Diagnosis not present

## 2017-08-01 DIAGNOSIS — R05 Cough: Secondary | ICD-10-CM | POA: Diagnosis not present

## 2017-08-01 MED ORDER — HYDROCOD POLST-CPM POLST ER 10-8 MG/5ML PO SUER: 5.0000 mL | Freq: Two times a day (BID) | ORAL | 0 refills | Status: DC | PRN

## 2017-08-01 MED ORDER — IPRATROPIUM BROMIDE 0.06 % NA SOLN: 2.0000 | Freq: Four times a day (QID) | NASAL | 0 refills | Status: DC

## 2017-08-01 NOTE — Discharge Instructions (Signed)
Nasal spray as prescribed.  Cough medication as directed.  Take care  Dr. Lacinda Axon

## 2017-08-01 NOTE — ED Triage Notes (Signed)
Patient started having symptom of cough, nasal congestion, and drainage 1 week ago after taking care of 53 month old grandson.

## 2017-08-01 NOTE — ED Provider Notes (Signed)
[facility]    CSN: [id number] Arrival date & time: 08/01/17  0850  History   Chief Complaint Chief Complaint  Patient presents with  . Cough  . Nasal Congestion   HPI  71 year old male presents with cough and congestion.  Started earlier this week after he kept his grandson who seem to have a viral respiratory illness.  He states that it started on Tuesday.  Has been experiencing cough and nasal congestion.  States that he feels like he has some postnasal drip as well.  Cough is worse at night.  He has been taking Mucinex without improvement.  Patient states that he feels like he is worsening.  No fevers or chills.  No known exacerbating factors.  No other associated symptoms.    Past Medical History:  Diagnosis Date  . Arthritis   . Dysrhythmia 2007   H/O IRREGULAR HEART BEAT-SAW CARDIOLOGIST AND SAID IT WAS NOTHING TO BE CONCERNED WITH  . GERD (gastroesophageal reflux disease)    H/O  . Hyperlipidemia   . Pre-diabetes     Patient Active Problem List   Diagnosis Date Noted  . Osteoarthritis 10/02/2015  . Prediabetes 09/09/2015  . Hx of colonic polyps 09/05/2015  . Hyperlipidemia 09/04/2015  . GERD (gastroesophageal reflux disease) 09/04/2015  . Dry eyes 09/04/2015    Past Surgical History:  Procedure Laterality Date  . COLONOSCOPY    . ESOPHAGOGASTRODUODENOSCOPY    . EXCISION MASS NECK N/A 09/07/2016   Procedure: EXCISION MASS NECK;  Surgeon: Florene Glen, MD;  Location: [facility] OR[facility];  Service: General;  Laterality: N/A;  . EXCISION OF BACK LESION N/A 09/07/2016   Procedure: EXCISION OF BACK LESION;  Surgeon: Florene Glen, MD;  Location: [facility] OR[facility];  Service: General;  Laterality: N/A;  . MOUTH SURGERY     dental procedure    Home Medications    Prior to Admission medications   Medication Sig Start Date End Date Taking? Authorizing Provider  atorvastatin (LIPITOR) 10 MG tablet Take 1 tablet (10 mg total) by mouth daily. 07/19/17  Yes Plonk,  Gwyndolyn Saxon, MD  carboxymethylcellulose (REFRESH PLUS) 0.5 % SOLN Place 1 drop into both eyes 3 (three) times daily as needed (dry eyes).    Yes [provider]  Glucosamine-Chondroitin 1500-1200 MG/30ML LIQD Take by mouth.   Yes [provider]  sodium chloride (OCEAN) 0.65 % SOLN nasal spray Place 1 spray into both nostrils as needed for congestion.   Yes [provider]  chlorpheniramine-HYDROcodone (TUSSIONEX PENNKINETIC ER) 10-8 MG/5ML SUER Take 5 mLs by mouth every 12 (twelve) hours as needed. 08/01/17   Thersa Salt G, DO  ipratropium (ATROVENT) 0.06 % nasal spray Place 2 sprays into both nostrils 4 (four) times daily. 08/01/17   Coral Spikes, DO    Family History Family History  Problem Relation Age of Onset  . Lymphoma Mother 22  . Hypertension Mother   . Cancer Father   . Alcohol abuse Father   . Mental illness Father   . Heart disease Brother     Social History Social History   Tobacco Use  . Smoking status: Never Smoker  . Smokeless tobacco: Never Used  Substance Use Topics  . Alcohol use: Yes    Alcohol/week: 3.0 oz    Types: 5 Glasses of wine per week  . Drug use: No     Allergies   Patient has no known allergies.   Review of Systems Review of Systems  Constitutional: Negative for  fever.  HENT: Positive for congestion and postnasal drip.   Respiratory: Positive for cough.   All other systems reviewed and are negative.  Physical Exam Triage Vital Signs ED Triage Vitals  Enc Vitals Group     BP 08/01/17 0944 132/67     Pulse Rate 08/01/17 0944 81     Resp 08/01/17 0944 16     Temp 08/01/17 0944 98.4 F (36.9 C)     Temp Source 08/01/17 0944 Oral     SpO2 08/01/17 0944 96 %     Weight --      Height --      Head Circumference --      Peak Flow --      Pain Score 08/01/17 0946 0     Pain Loc --      Pain Edu? --      Excl. in [place]? --    Updated Vital Signs BP 132/67 (BP Location: Left Arm)   Pulse 81   Temp 98.4 F  (36.9 C) (Oral)   Resp 16   SpO2 96%   Physical Exam  Constitutional: He is oriented to person, place, and time. He appears well-developed. No distress.  HENT:  Head: Normocephalic and atraumatic.  Nose: Nose normal.  Mouth/Throat: Oropharynx is clear and moist.  Normal TMs bilaterally.  Eyes: Conjunctivae are normal. Right eye exhibits no discharge. Left eye exhibits no discharge.  Neck: Neck supple. No tracheal deviation present.  Cardiovascular: Normal rate and regular rhythm.  2/6 systolic murmur heard best at the left sternal border.  Pulmonary/Chest: Effort normal and breath sounds normal. He has no wheezes. He has no rales.  Musculoskeletal: Normal range of motion.  Neurological: He is alert and oriented to person, place, and time.  Normal speech.  Skin: Skin is warm. No rash noted.  Psychiatric: He has a normal mood and affect. His behavior is normal.  Vitals reviewed.  UC Treatments / Results  Labs (all labs ordered are listed, but only abnormal results are displayed) Labs Reviewed - No data to display  EKG  EKG Interpretation None       Radiology No results found.  Procedures Procedures (including critical care time)  Medications Ordered in UC Medications - No data to display   Initial Impression / Assessment and Plan / UC Course  I have reviewed the triage vital signs and the nursing notes.  Pertinent labs & imaging results that were available during my care of the patient were reviewed by me and considered in my medical decision making (see chart for details).     71 year old male presents with a viral respiratory infection.  Tussionex and Atrovent for symptomatic treatment.  Final Clinical Impressions(s) / UC Diagnoses   Final diagnoses:  Viral upper respiratory tract infection    ED Discharge Orders        Ordered    chlorpheniramine-HYDROcodone (TUSSIONEX PENNKINETIC ER) 10-8 MG/5ML SUER  Every 12 hours PRN     08/01/17 1012     ipratropium (ATROVENT) 0.06 % nasal spray  4 times daily     08/01/17 1012     Controlled Substance Prescriptions [place] Controlled Substance Registry consulted? Not Applicable   Coral Spikes, DO 08/01/17 1022

## 2017-11-28 ENCOUNTER — Ambulatory Visit (INDEPENDENT_AMBULATORY_CARE_PROVIDER_SITE_OTHER): Payer: Medicare Other | Admitting: Family Medicine

## 2017-11-28 ENCOUNTER — Encounter: Payer: Self-pay | Admitting: Family Medicine

## 2017-11-28 ENCOUNTER — Other Ambulatory Visit: Payer: Self-pay

## 2017-11-28 VITALS — BP 118/78 | HR 93 | Temp 98.3°F | Resp 16 | Ht 70.0 in | Wt 197.2 lb

## 2017-11-28 DIAGNOSIS — R059 Cough, unspecified: Secondary | ICD-10-CM

## 2017-11-28 DIAGNOSIS — J101 Influenza due to other identified influenza virus with other respiratory manifestations: Secondary | ICD-10-CM | POA: Diagnosis not present

## 2017-11-28 DIAGNOSIS — R05 Cough: Secondary | ICD-10-CM

## 2017-11-28 LAB — POCT INFLUENZA A/B
INFLUENZA A, POC: POSITIVE — AB
INFLUENZA B, POC: NEGATIVE

## 2017-11-28 NOTE — Progress Notes (Signed)
Date:  11/28/2017   Name:  Jeff Mcmahon   DOB:  March 02, 1946   MRN:  [id number]  PCP:  Adline Potter, MD    Chief Complaint: Cough [place] to [place] and started getting sick on the way home [place] then fri started fever 104 Sat had headache and cough and bodyache now for 3-4 days )   History of Present Illness:  This is a 72 y.o. male with 3d hx cough, HA, fever, myalgias. Taking Tussionex at night for cough. Had flu imm in fall.  Review of Systems:  Review of Systems  HENT: Negative for ear pain, sinus pain and sore throat.   Respiratory: Negative for shortness of breath.   Cardiovascular: Negative for chest pain and leg swelling.  Neurological: Negative for syncope and light-headedness.    Patient Active Problem List   Diagnosis Date Noted  . Osteoarthritis 10/02/2015  . Prediabetes 09/09/2015  . Hx of colonic polyps 09/05/2015  . Hyperlipidemia 09/04/2015  . GERD (gastroesophageal reflux disease) 09/04/2015  . Dry eyes 09/04/2015    Prior to Admission medications   Medication Sig Start Date End Date Taking? Authorizing Provider  acetaminophen (TYLENOL) 325 MG tablet Take 650 mg by mouth every 6 (six) hours as needed.   Yes [provider]  atorvastatin (LIPITOR) 10 MG tablet Take 1 tablet (10 mg total) by mouth daily. 07/19/17  Yes Murriel Holwerda, Gwyndolyn Saxon, MD  carboxymethylcellulose (REFRESH PLUS) 0.5 % SOLN Place 1 drop into both eyes 3 (three) times daily as needed (dry eyes).    Yes [provider]  chlorpheniramine-HYDROcodone (TUSSIONEX PENNKINETIC ER) 10-8 MG/5ML SUER Take 5 mLs by mouth every 12 (twelve) hours as needed. 08/01/17  Yes Cook, Barnie Del, DO  Glucosamine-Chondroitin 1500-1200 MG/30ML LIQD Take by mouth.   Yes [provider]  HYDROcodone-Chlorpheniramine 5-4 MG/5ML SOLN Take by mouth.   Yes [provider]  ipratropium (ATROVENT) 0.06 % nasal spray Place 2 sprays into both nostrils 4 (four) times daily. 08/01/17  Yes Cook, Jayce  G, DO  sodium chloride (OCEAN) 0.65 % SOLN nasal spray Place 1 spray into both nostrils as needed for congestion.   Yes [provider]    No Known Allergies  Past Surgical History:  Procedure Laterality Date  . COLONOSCOPY    . ESOPHAGOGASTRODUODENOSCOPY    . EXCISION MASS NECK N/A 09/07/2016   Procedure: EXCISION MASS NECK;  Surgeon: Florene Glen, MD;  Location: [facility] ORS;  Service: General;  Laterality: N/A;  . EXCISION OF BACK LESION N/A 09/07/2016   Procedure: EXCISION OF BACK LESION;  Surgeon: Florene Glen, MD;  Location: [facility] ORS;  Service: General;  Laterality: N/A;  . MOUTH SURGERY     dental procedure    Social History   Tobacco Use  . Smoking status: Never Smoker  . Smokeless tobacco: Never Used  Substance Use Topics  . Alcohol use: Yes    Alcohol/week: 3.0 oz    Types: 5 Glasses of wine per week  . Drug use: No    Family History  Problem Relation Age of Onset  . Lymphoma Mother 70  . Hypertension Mother   . Cancer Father   . Alcohol abuse Father   . Mental illness Father   . Heart disease Brother     Medication list has been reviewed and updated.  Physical Examination: BP 118/78   Pulse 93   Temp 98.3 F (36.8 C)   Resp 16   Ht 5\' 10"  (1.778 m)  Wt 197 lb 3.2 oz (89.4 kg)   SpO2 95%   BMI 28.30 kg/m   Physical Exam  Constitutional: He appears well-developed and well-nourished.  HENT:  Mouth/Throat: Oropharynx is clear and moist.  Cardiovascular: Normal rate, regular rhythm and normal heart sounds.  Pulmonary/Chest: Effort normal and breath sounds normal.  Musculoskeletal: He exhibits no edema.  Neurological: He is alert.  Skin: Skin is warm and dry.  Nursing note and vitals reviewed.   Assessment and Plan:  1. Influenza A Beyond 48h when Tamiflu would be effective, Tylenol/Tussionex prn  2. Cough Rapid flu A positive - POCT Influenza A/B  Return if symptoms worsen or fail to improve.  [name]. [facility]  11/28/2017

## 2018-02-22 ENCOUNTER — Ambulatory Visit (INDEPENDENT_AMBULATORY_CARE_PROVIDER_SITE_OTHER): Payer: Medicare Other

## 2018-02-22 VITALS — BP 104/60 | HR 72 | Temp 98.0°F | Resp 12 | Ht 70.0 in | Wt 199.4 lb

## 2018-02-22 DIAGNOSIS — Z0000 Encounter for general adult medical examination without abnormal findings: Secondary | ICD-10-CM

## 2018-02-22 NOTE — Patient Instructions (Signed)
Jeff Mcmahon , Thank you for taking time to come for your Medicare Wellness Visit. I appreciate your ongoing commitment to your health goals. Please review the following plan we discussed and let me know if I can assist you in the future.   Screening recommendations/referrals: Colorectal Screening: Up to date  Vision and Dental Exams: Recommended annual ophthalmology exams for early detection of glaucoma and other disorders of the eye Recommended annual dental exams for proper oral hygiene  Vaccinations: Influenza vaccine: Up to date Pneumococcal vaccine: Up to date Tdap vaccine: Up to date Shingles vaccine: Up to date    Advanced directives: Please bring a copy of your POA (Power of [place]) and/or Living Will to your next appointment.  Goals: Recommend to drink at least 6-8 8oz glasses of water per day.  Next appointment: Please schedule your Annual Wellness Visit with your Nurse Health Advisor in one year.  Preventive Care 71 Years and Older, Male Preventive care refers to lifestyle choices and visits with your health care provider that can promote health and wellness. What does preventive care include?  A yearly physical exam. This is also called an annual well check.  Dental exams once or twice a year.  Routine eye exams. Ask your health care provider how often you should have your eyes checked.  Personal lifestyle choices, including:  Daily care of your teeth and gums.  Regular physical activity.  Eating a healthy diet.  Avoiding tobacco and drug use.  Limiting alcohol use.  Practicing safe sex.  Taking low doses of aspirin every day.  Taking vitamin and mineral supplements as recommended by your health care provider. What happens during an annual well check? The services and screenings done by your health care provider during your annual well check will depend on your age, overall health, lifestyle risk factors, and family history of disease. Counseling    Your health care provider may ask you questions about your:  Alcohol use.  Tobacco use.  Drug use.  Emotional well-being.  Home and relationship well-being.  Sexual activity.  Eating habits.  History of falls.  Memory and ability to understand (cognition).  Work and work Statistician. Screening  You may have the following tests or measurements:  Height, weight, and BMI.  Blood pressure.  Lipid and cholesterol levels. These may be checked every 5 years, or more frequently if you are over 21 years old.  Skin check.  Lung cancer screening. You may have this screening every year starting at age 71 if you have a 30-pack-year history of smoking and currently smoke or have quit within the past 15 years. if you have a 30-pack-year history of smoking and currently smoke or have quit within the past 15 years.  Fecal occult blood test (FOBT) of the stool. You may have this test every year starting at age 71..  Flexible sigmoidoscopy or colonoscopy. You may have a sigmoidoscopy every 5 years or a colonoscopy every 10 years starting at age 71..  Prostate cancer screening. Recommendations will vary depending on your family history and other risks.  Hepatitis C blood test.  Hepatitis B blood test.  Sexually transmitted disease (STD) testing.  Diabetes screening. This is done by checking your blood sugar (glucose) after you have not eaten for a while (fasting). You may have this done every 1-3 years.  Abdominal aortic aneurysm (AAA) screening. You may need this if you are a current or former smoker.  Osteoporosis. You may be screened starting at age 71 if you are at high risk. Talk with your health care provider about your test results, treatment options, and if  necessary, the need for more tests. Vaccines  Your health care provider may recommend certain vaccines, such as:  Influenza vaccine. This is recommended every year.  Tetanus, diphtheria, and acellular pertussis (Tdap, Td) vaccine. You may need a Td booster every 10 years.  Zoster vaccine. You may need this after age  71.  Pneumococcal 13-valent conjugate (PCV13) vaccine. One dose is recommended after age 71.  Pneumococcal polysaccharide (PPSV23) vaccine. One dose is recommended after age 71. Talk to your health care provider about which screenings and vaccines you need and how often you need them. This information is not intended to replace advice given to you by your health care provider. Make sure you discuss any questions you have with your health care provider. Document Released: 08/22/2015 Document Revised: 04/14/2016 Document Reviewed: 05/27/2015 Elsevier Interactive Patient Education  2017 [place] Prevention in the Home Falls can cause injuries. They can happen to people of all ages. There are many things you can do to make your home safe and to help prevent falls. What can I do on the outside of my home?  Regularly fix the edges of walkways and driveways and fix any cracks.  Remove anything that might make you trip as you walk through a door, such as a raised step or threshold.  Trim any bushes or trees on the path to your home.  Use bright outdoor lighting.  Clear any walking paths of anything that might make someone trip, such as rocks or tools.  Regularly check to see if handrails are loose or broken. Make sure that both sides of any steps have handrails.  Any raised decks and porches should have guardrails on the edges.  Have any leaves, snow, or ice cleared regularly.  Use sand or salt on walking paths during winter.  Clean up any spills in your garage right away. This includes oil or grease spills. What can I do in the bathroom?  Use night lights.  Install grab bars by the toilet and in the tub and shower. Do not use towel bars as grab bars.  Use non-skid mats or decals in the tub or shower.  If you need to sit down in the shower, use a plastic, non-slip stool.  Keep the floor dry. Clean up any water that spills on the floor as soon as it happens.  Remove  soap buildup in the tub or shower regularly.  Attach bath mats securely with double-sided non-slip rug tape.  Do not have throw rugs and other things on the floor that can make you trip. What can I do in the bedroom?  Use night lights.  Make sure that you have a light by your bed that is easy to reach.  Do not use any sheets or blankets that are too big for your bed. They should not hang down onto the floor.  Have a firm chair that has side arms. You can use this for support while you get dressed.  Do not have throw rugs and other things on the floor that can make you trip. What can I do in the kitchen?  Clean up any spills right away.  Avoid walking on wet floors.  Keep items that you use a lot in easy-to-reach places.  If you need to reach something above you, use a strong step stool that has a grab bar.  Keep electrical cords out of the way.  Do not use floor polish or wax that makes floors slippery. If you must  use wax, use non-skid floor wax.  Do not have throw rugs and other things on the floor that can make you trip. What can I do with my stairs?  Do not leave any items on the stairs.  Make sure that there are handrails on both sides of the stairs and use them. Fix handrails that are broken or loose. Make sure that handrails are as long as the stairways.  Check any carpeting to make sure that it is firmly attached to the stairs. Fix any carpet that is loose or worn.  Avoid having throw rugs at the top or bottom of the stairs. If you do have throw rugs, attach them to the floor with carpet tape.  Make sure that you have a light switch at the top of the stairs and the bottom of the stairs. If you do not have them, ask someone to add them for you. What else can I do to help prevent falls?  Wear shoes that:  Do not have high heels.  Have rubber bottoms.  Are comfortable and fit you well.  Are closed at the toe. Do not wear sandals.  If you use a  stepladder:  Make sure that it is fully opened. Do not climb a closed stepladder.  Make sure that both sides of the stepladder are locked into place.  Ask someone to hold it for you, if possible.  Clearly mark and make sure that you can see:  Any grab bars or handrails.  First and last steps.  Where the edge of each step is.  Use tools that help you move around (mobility aids) if they are needed. These include:  Canes.  Walkers.  Scooters.  Crutches.  Turn on the lights when you go into a dark area. Replace any light bulbs as soon as they burn out.  Set up your furniture so you have a clear path. Avoid moving your furniture around.  If any of your floors are uneven, fix them.  If there are any pets around you, be aware of where they are.  Review your medicines with your doctor. Some medicines can make you feel dizzy. This can increase your chance of falling. Ask your doctor what other things that you can do to help prevent falls. This information is not intended to replace advice given to you by your health care provider. Make sure you discuss any questions you have with your health care provider. Document Released: 05/22/2009 Document Revised: 01/01/2016 Document Reviewed: 08/30/2014 Elsevier Interactive Patient Education  2017 [employer].

## 2018-02-22 NOTE — Progress Notes (Signed)
Subjective:   Jeff Mcmahon is a 72 y.o. male who presents for Medicare Annual/Subsequent preventive examination.  Review of Systems:  N/A Cardiac Risk Factors include: advanced age (>54men, >1 women);male gender;dyslipidemia     Objective:    Vitals: BP 104/60 (BP Location: Right Arm, Patient Position: Sitting, Cuff Size: Normal)   Pulse 72   Temp 98 F (36.7 C) (Oral)   Resp 12   Ht 5\' 10"  (1.778 m)   Wt 199 lb 6.4 oz (90.4 kg)   SpO2 94%   BMI 28.61 kg/m   Body mass index is 28.61 kg/m.  Advanced Directives 02/22/2018 05/30/2017 02/21/2017 09/07/2016 08/31/2016 09/04/2015  Does Patient Have a Medical Advance Directive? Yes No Yes No No No  Type of Paramedic of [place];Living will - [place];Living will - - -  Copy of [place] in Chart? No - copy requested - Yes - - -  Would patient like information on creating a medical advance directive? - - - No - Patient declined - -    Tobacco Social History   Tobacco Use  Smoking Status Never Smoker  Smokeless Tobacco Never Used  Tobacco Comment   smoking cessation materials not required     Counseling given: No Comment: smoking cessation materials not required  Clinical Intake:  Pre-visit preparation completed: Yes  Pain : No/denies pain   BMI - recorded: 28.61 Nutritional Status: BMI 25 -29 Overweight Nutritional Risks: None Diabetes: No  How often do you need to have someone help you when you read instructions, pamphlets, or other written materials from your doctor or pharmacy?: 1 - Never  Interpreter Needed?: No  Information entered by :: AEversole, LPN  Past Medical History:  Diagnosis Date  . Arthritis   . Dysrhythmia 2007   H/O IRREGULAR HEART BEAT-SAW CARDIOLOGIST AND SAID IT WAS NOTHING TO BE CONCERNED WITH  . GERD (gastroesophageal reflux disease)    H/O  . Hyperlipidemia   . Pre-diabetes    Past Surgical History:  Procedure  Laterality Date  . COLONOSCOPY    . ESOPHAGOGASTRODUODENOSCOPY    . EXCISION MASS NECK N/A 09/07/2016   Procedure: EXCISION MASS NECK;  Surgeon: Florene Glen, MD;  Location: [facility] ORS;  Service: General;  Laterality: N/A;  . EXCISION OF BACK LESION N/A 09/07/2016   Procedure: EXCISION OF BACK LESION;  Surgeon: Florene Glen, MD;  Location: [facility] ORS;  Service: General;  Laterality: N/A;  . MOUTH SURGERY     dental procedure   Family History  Problem Relation Age of Onset  . Lymphoma Mother 39  . Hypertension Mother   . Cancer Father        lung  . Alcohol abuse Father   . Mental illness Father   . Heart disease Brother    Social History   Socioeconomic History  . Marital status: Married    Spouse name: Not on file  . Number of children: 2  . Years of education: Not on file  . Highest education level: Professional school degree (e.g., MD, DDS, DVM, JD)  Occupational History  . Occupation: Retired  Scientific laboratory technician  . Financial resource strain: Not hard at all  . Food insecurity:    Worry: Never true    Inability: Never true  . Transportation needs:    Medical: No    Non-medical: No  Tobacco Use  . Smoking status: Never Smoker  . Smokeless tobacco: Never Used  . Tobacco  comment: smoking cessation materials not required  Substance and Sexual Activity  . Alcohol use: Yes    Comment: social  . Drug use: No  . Sexual activity: Yes  Lifestyle  . Physical activity:    Days per week: 5 days    Minutes per session: 30 min  . Stress: Not at all  Relationships  . Social connections:    Talks on phone: Patient refused    Gets together: Patient refused    Attends religious service: Patient refused    Active member of club or organization: Patient refused    Attends meetings of clubs or organizations: Patient refused    Relationship status: Married  Other Topics Concern  . Not on file  Social History Narrative  . Not on file    Outpatient Encounter Medications as of  02/22/2018  Medication Sig  . acetaminophen (TYLENOL) 325 MG tablet Take 650 mg by mouth every 6 (six) hours as needed.  Marland Kitchen atorvastatin (LIPITOR) 10 MG tablet Take 1 tablet (10 mg total) by mouth daily.  . carboxymethylcellulose (REFRESH PLUS) 0.5 % SOLN Place 1 drop into both eyes 3 (three) times daily as needed (dry eyes).   . Glucosamine-Chondroitin 1500-1200 MG/30ML LIQD Take by mouth.  . MULTIPLE VITAMIN PO Take 1 tablet by mouth daily.  . Omega-3 Fatty Acids (FISH OIL) 1000 MG CAPS Take 1 capsule by mouth daily.  . sodium chloride (OCEAN) 0.65 % SOLN nasal spray Place 1 spray into both nostrils as needed for congestion.  . chlorpheniramine-HYDROcodone (TUSSIONEX PENNKINETIC ER) 10-8 MG/5ML SUER Take 5 mLs by mouth every 12 (twelve) hours as needed.  Marland Kitchen HYDROcodone-Chlorpheniramine 5-4 MG/5ML SOLN Take by mouth.  Marland Kitchen ipratropium (ATROVENT) 0.06 % nasal spray Place 2 sprays into both nostrils 4 (four) times daily.   No facility-administered encounter medications on file as of 02/22/2018.     Activities of Daily Living In your present state of health, do you have any difficulty performing the following activities: 02/22/2018  Hearing? N  Comment denies hearing aids  Vision? N  Comment wears eyeglasses  Difficulty concentrating or making decisions? N  Walking or climbing stairs? N  Dressing or bathing? N  Doing errands, shopping? N  Preparing Food and eating ? N  Comment denies dentures  Using the Toilet? N  In the past six months, have you accidently leaked urine? N  Do you have problems with loss of bowel control? N  Managing your Medications? N  Managing your Finances? N  Housekeeping or managing your Housekeeping? N  Some recent data might be hidden    Patient Care Team: Juline Patch, MD as PCP - General (Family Medicine) Dasher, Rayvon Char, MD as Consulting Physician (Dermatology)   Assessment:   This is a routine wellness examination for Jeff Mcmahon.  Exercise Activities and  Dietary recommendations Current Exercise Habits: Home exercise routine, Type of exercise: strength training/weights;walking;Other - see comments(stationary bike), Time (Minutes): 30, Frequency (Times/Week): 5, Weekly Exercise (Minutes/Week): 150, Intensity: Mild, Exercise limited by: None identified  Goals    . DIET - INCREASE WATER INTAKE     Recommend to drink at least 6-8 8oz glasses of water per day.       Fall Risk Fall Risk  02/22/2018 02/21/2017 01/12/2017 12/30/2015 09/04/2015  Falls in the past year? No No No No No  Risk for fall due to : Impaired vision - - - -  Risk for fall due to: Comment wears eyeglasses - - - -  FALL RISK PREVENTION PERTAINING TO HOME: Is your home free of loose throw rugs in walkways, pet beds, electrical cords, etc? Yes Is there adequate lighting in your home to reduce risk of falls?  Yes Are there stairs in or around your home WITH handrails? Yes  ASSISTIVE DEVICES UTILIZED TO PREVENT FALLS: Use of a cane, walker or w/c? No Grab bars in the bathroom? No  Shower chair or a place to sit while bathing? Yes An elevated toilet seat or a handicapped toilet? Yes  Timed Get Up and Go Performed: Yes. Pt ambulated 10 feet within 6 sec. Gait stead-fast and without the use of an assistive device. No intervention required at this time. Fall risk prevention has been discussed.  Community Resource Referral:  Pt declined my offer to send [employer] Referral to Care Guide for installation of grab bars in the shower.  Depression Screen PHQ 2/9 Scores 02/22/2018 02/21/2017 01/12/2017 12/30/2015  PHQ - 2 Score 0 0 0 0  PHQ- 9 Score 0 - - -    Cognitive Function     6CIT Screen 02/22/2018 02/21/2017  What Year? 0 points 0 points  What month? 0 points 0 points  What time? 0 points 0 points  Count back from 20 0 points 0 points  Months in reverse 0 points 0 points  Repeat phrase 0 points 0 points  Total Score 0 0    Immunization History  Administered  Date(s) Administered  . Influenza, High Dose Seasonal PF 04/10/2017  . Influenza-Unspecified 04/10/2015, 04/09/2017  . Tdap 08/09/2010  . Zoster 09/04/2015  . Zoster Recombinat (Shingrix) 02/06/2017, 04/09/2017    Qualifies for Shingles Vaccine? No. Completed Shingrix series  Screening Tests Health Maintenance  Topic Date Due  . INFLUENZA VACCINE  05/09/2018 (Originally 03/09/2018)  . COLONOSCOPY  05/07/2020  . TETANUS/TDAP  08/09/2020  . Hepatitis C Screening  Completed  . PNA vac Low Risk Adult  Completed   Cancer Screenings: Lung: Low Dose CT Chest recommended if Age 47-80 years, 30 pack-year currently smoking OR have quit w/in 15years. Patient does not qualify. Colorectal: Verbalized he completed colonoscopy in 2016. Unable to locate report. Verbalized he would need to repeat colonoscopy in 2021 (q5years)  Additional Screenings: Hepatitis C Screening: Completed 12/30/15     Plan:  I have personally reviewed and addressed the Medicare Annual Wellness questionnaire and have noted the following in the patient's chart:  A. Medical and social history B. Use of alcohol, tobacco or illicit drugs  C. Current medications and supplements D. Functional ability and status E.  Nutritional status F.  Physical activity G. Advance directives H. List of other physicians I.  Hospitalizations, surgeries, and ER visits in previous 12 months J.  [place] such as hearing and vision if needed, cognitive and depression L. Referrals and appointments  In addition, I have reviewed and discussed with patient certain preventive protocols, quality metrics, and best practice recommendations. A written personalized care plan for preventive services as well as general preventive health recommendations were provided to patient.  Signed,  Aleatha Borer, LPN Nurse Health Advisor  MD Recommendations: None

## 2018-02-27 ENCOUNTER — Ambulatory Visit: Payer: Self-pay

## 2018-04-03 DIAGNOSIS — X32XXXA Exposure to sunlight, initial encounter: Secondary | ICD-10-CM | POA: Diagnosis not present

## 2018-04-03 DIAGNOSIS — L820 Inflamed seborrheic keratosis: Secondary | ICD-10-CM | POA: Diagnosis not present

## 2018-04-03 DIAGNOSIS — B354 Tinea corporis: Secondary | ICD-10-CM | POA: Diagnosis not present

## 2018-04-03 DIAGNOSIS — L570 Actinic keratosis: Secondary | ICD-10-CM | POA: Diagnosis not present

## 2018-04-03 DIAGNOSIS — L538 Other specified erythematous conditions: Secondary | ICD-10-CM | POA: Diagnosis not present

## 2018-07-25 ENCOUNTER — Encounter: Payer: Self-pay | Admitting: Family Medicine

## 2018-07-25 ENCOUNTER — Ambulatory Visit (INDEPENDENT_AMBULATORY_CARE_PROVIDER_SITE_OTHER): Payer: Medicare Other | Admitting: Family Medicine

## 2018-07-25 ENCOUNTER — Ambulatory Visit: Payer: Medicare Other | Admitting: Family Medicine

## 2018-07-25 VITALS — BP 120/80 | HR 80 | Ht 70.0 in | Wt 200.0 lb

## 2018-07-25 DIAGNOSIS — Z0000 Encounter for general adult medical examination without abnormal findings: Secondary | ICD-10-CM

## 2018-07-25 DIAGNOSIS — E7800 Pure hypercholesterolemia, unspecified: Secondary | ICD-10-CM

## 2018-07-25 DIAGNOSIS — R69 Illness, unspecified: Secondary | ICD-10-CM | POA: Diagnosis not present

## 2018-07-25 DIAGNOSIS — R351 Nocturia: Secondary | ICD-10-CM | POA: Diagnosis not present

## 2018-07-25 DIAGNOSIS — R7303 Prediabetes: Secondary | ICD-10-CM

## 2018-07-25 DIAGNOSIS — N5201 Erectile dysfunction due to arterial insufficiency: Secondary | ICD-10-CM

## 2018-07-25 MED ORDER — ATORVASTATIN CALCIUM 10 MG PO TABS: 10.0000 mg | ORAL_TABLET | Freq: Every day | ORAL | 1 refills | Status: DC

## 2018-07-25 MED ORDER — TADALAFIL 20 MG PO TABS: 20.0000 mg | ORAL_TABLET | Freq: Every day | ORAL | 11 refills | Status: DC | PRN

## 2018-07-25 MED ORDER — FISH OIL 1000 MG PO CAPS: 1.0000 | ORAL_CAPSULE | Freq: Every day | ORAL | 3 refills | Status: DC

## 2018-07-25 MED ORDER — ATORVASTATIN CALCIUM 10 MG PO TABS: 10.0000 mg | ORAL_TABLET | Freq: Every day | ORAL | 3 refills | Status: DC

## 2018-07-25 NOTE — Progress Notes (Signed)
Date:  07/25/2018   Name:  Jeff Mcmahon   DOB:  Nov 01, 1945   MRN:  [id number]   Chief Complaint: Hyperlipidemia and prediabetic (5.7- 5.9 in the past. Weight is up, so wants it checked today)  Patient is a 72 year old male who presents for a comprehensive physical exam. The patient reports the following problems: prediabetes/ . Health maintenance has been reviewed up to date.  Hyperlipidemia  This is a chronic problem. The current episode started more than 1 year ago. The problem is controlled. Recent lipid tests were reviewed and are normal. He has no history of chronic renal disease, diabetes, hypothyroidism, liver disease, obesity or nephrotic syndrome. There are no known factors aggravating his hyperlipidemia. Pertinent negatives include no chest pain, focal sensory loss, focal weakness, leg pain, myalgias or shortness of breath. Current antihyperlipidemic treatment includes diet change and statins. The current treatment provides moderate improvement of lipids. There are no compliance problems.  Risk factors for coronary artery disease include male sex and dyslipidemia.  Diabetes  He presents for his follow-up diabetic visit. Diabetes type: prediabetes. His disease course has been stable. There are no hypoglycemic associated symptoms. Pertinent negatives for hypoglycemia include no dizziness, headaches or nervousness/anxiousness. Pertinent negatives for diabetes include no blurred vision, no chest pain, no fatigue, no foot paresthesias, no foot ulcerations, no polydipsia, no polyphagia, no polyuria, no visual change, no weakness and no weight loss. There are no hypoglycemic complications. Symptoms are stable. Pertinent negatives for diabetic complications include no autonomic neuropathy, CVA, heart disease, impotence, nephropathy, peripheral neuropathy, PVD or retinopathy. Risk factors for coronary artery disease include diabetes mellitus and dyslipidemia. Current diabetic treatment includes  diet. His weight is fluctuating minimally. He is following a generally healthy diet. Meal planning includes avoidance of concentrated sweets and carbohydrate counting. An ACE inhibitor/angiotensin II receptor blocker is not being taken. Eye exam is not current.    Review of Systems  Constitutional: Negative for chills, fatigue, fever and weight loss.  HENT: Negative for drooling, ear discharge, ear pain and sore throat.   Eyes: Negative for blurred vision.  Respiratory: Negative for cough, shortness of breath and wheezing.   Cardiovascular: Negative for chest pain, palpitations and leg swelling.  Gastrointestinal: Negative for abdominal pain, blood in stool, constipation, diarrhea and nausea.  Endocrine: Negative for polydipsia, polyphagia and polyuria.  Genitourinary: Negative for dysuria, frequency, hematuria, impotence and urgency.  Musculoskeletal: Negative for back pain, myalgias and neck pain.  Skin: Negative for rash.  Allergic/Immunologic: Negative for environmental allergies.  Neurological: Negative for dizziness, focal weakness, weakness and headaches.  Hematological: Does not bruise/bleed easily.  Psychiatric/Behavioral: Negative for suicidal ideas. The patient is not nervous/anxious.     Patient Active Problem List   Diagnosis Date Noted  . Osteoarthritis 10/02/2015  . Prediabetes 09/09/2015  . Hx of colonic polyps 09/05/2015  . Hyperlipidemia 09/04/2015  . GERD (gastroesophageal reflux disease) 09/04/2015  . Dry eyes 09/04/2015    No Known Allergies  Past Surgical History:  Procedure Laterality Date  . COLONOSCOPY    . ESOPHAGOGASTRODUODENOSCOPY    . EXCISION MASS NECK N/A 09/07/2016   Procedure: EXCISION MASS NECK;  Surgeon: Florene Glen, MD;  Location: [facility] ORS;  Service: General;  Laterality: N/A;  . EXCISION OF BACK LESION N/A 09/07/2016   Procedure: EXCISION OF BACK LESION;  Surgeon: Florene Glen, MD;  Location: [facility] ORS;  Service: General;  Laterality:  N/A;  . MOUTH SURGERY     dental procedure  Social History   Tobacco Use  . Smoking status: Never Smoker  . Smokeless tobacco: Never Used  . Tobacco comment: smoking cessation materials not required  Substance Use Topics  . Alcohol use: Yes    Comment: social  . Drug use: No     Medication list has been reviewed and updated.  Current Meds  Medication Sig  . acetaminophen (TYLENOL) 325 MG tablet Take 650 mg by mouth every 6 (six) hours as needed.  Marland Kitchen atorvastatin (LIPITOR) 10 MG tablet Take 1 tablet (10 mg total) by mouth daily.  . carboxymethylcellulose (REFRESH PLUS) 0.5 % SOLN Place 1 drop into both eyes 3 (three) times daily as needed (dry eyes).   . Glucosamine-Chondroitin 1500-1200 MG/30ML LIQD Take by mouth.  . MULTIPLE VITAMIN PO Take 1 tablet by mouth daily.  . Omega-3 Fatty Acids (FISH OIL) 1000 MG CAPS Take 1 capsule by mouth daily.  . sodium chloride (OCEAN) 0.65 % SOLN nasal spray Place 1 spray into both nostrils as needed for congestion.    PHQ 2/9 Scores 07/25/2018 02/22/2018 02/21/2017 01/12/2017  PHQ - 2 Score 0 0 0 0  PHQ- 9 Score 0 0 - -    Physical Exam Vitals signs and nursing note reviewed.  Constitutional:      Appearance: He is normal weight.  HENT:     Head: Normocephalic.     Right Ear: Tympanic membrane, ear canal and external ear normal.     Left Ear: Tympanic membrane, ear canal and external ear normal.     Nose: Nose normal. No nasal deformity.     Mouth/Throat:     Dentition: Normal dentition.     Palate: No mass.     Pharynx: Oropharynx is clear.  Eyes:     General: Lids are normal. No scleral icterus.       Right eye: No discharge.        Left eye: No discharge.     Extraocular Movements: Extraocular movements intact.     Conjunctiva/sclera: Conjunctivae normal.     Pupils: Pupils are equal, round, and reactive to light.  Neck:     Musculoskeletal: Normal range of motion and neck supple. No neck rigidity.     Thyroid: No thyroid  mass or thyromegaly.     Vascular: Normal carotid pulses. No carotid bruit, hepatojugular reflux or JVD.     Trachea: No tracheal deviation.  Cardiovascular:     Rate and Rhythm: Normal rate and regular rhythm.     Pulses:          Carotid pulses are 1+ on the right side and 1+ on the left side.      Radial pulses are 1+ on the right side and 1+ on the left side.       Femoral pulses are 1+ on the right side and 1+ on the left side.      Popliteal pulses are 1+ on the right side and 1+ on the left side.       Dorsalis pedis pulses are 1+ on the right side and 1+ on the left side.       Posterior tibial pulses are 1+ on the right side and 1+ on the left side.     Heart sounds: Normal heart sounds, S1 normal and S2 normal. No murmur. No systolic murmur. No diastolic murmur. No friction rub. No gallop. No S3 or S4 sounds.   Pulmonary:     Effort: No respiratory distress.  Breath sounds: Normal breath sounds. No decreased breath sounds, wheezing, rhonchi or rales.  Chest:     Chest wall: No mass.     Breasts: Breasts are symmetrical.        Right: No mass.        Left: No mass.  Abdominal:     General: Bowel sounds are normal.     Palpations: Abdomen is soft. There is no hepatomegaly, splenomegaly or mass.     Tenderness: There is no abdominal tenderness. There is no guarding or rebound.  Genitourinary:    Penis: Normal.      Scrotum/Testes: Normal.     Prostate: Normal. Not enlarged, not tender and no nodules present.     Rectum: Normal. Guaiac result negative. No mass.  Musculoskeletal: Normal range of motion.        General: No tenderness.     Cervical back: Normal.     Thoracic back: Normal.     Lumbar back: Normal.  Feet:     Right foot:     Protective Sensation: 10 sites tested. 10 sites sensed.     Skin integrity: No ulcer or blister.     Left foot:     Protective Sensation: 10 sites sensed.     Skin integrity: No ulcer or blister.  Lymphadenopathy:     Cervical: No  cervical adenopathy.     Right cervical: No superficial cervical adenopathy.    Left cervical: No superficial cervical adenopathy.  Skin:    General: Skin is warm.     Findings: No rash.  Neurological:     Mental Status: He is alert and oriented to person, place, and time.     Cranial Nerves: No cranial nerve deficit.     Deep Tendon Reflexes: Reflexes are normal and symmetric.     BP 120/80   Pulse 80   Ht 5\' 10"  (1.778 m)   Wt 200 lb (90.7 kg)   BMI 28.70 kg/m   Assessment and Plan:  1. Encounter for annual health examination No subjective/objective concerns noted on history and physical exam.Immunizations are reviewed and recommendations provided.   Age appropriate screening tests are discussed. Counseling given for risk factor reduction interventions.  2. Prediabetes Chronic stable check A1c and renal function.  Continue with low-carb diet avoidance of saturated sugar. - HgB A1c - Renal Function Panel  3. Pure hypercholesterolemia Chronic.  Stable.  Continue atorvastatin 10 mg 1 a day.  Check lipid panel. - Lipid Panel With LDL/HDL Ratio  4. Taking medication for chronic disease Patient currently on statin medication.  Will check hepatic function panel. - Hepatic function panel  5. Nocturia And has episodes of nocturia and has concerns about prostate.  Check a PSA.  Digital rectal exam noted normal prostate. - PSA  6. Erectile dysfunction due to arterial insufficiency And would like a prescription of Cialis 20 mg for trial risk and if it discussed. - tadalafil (ADCIRCA/CIALIS) 20 MG tablet; Take 1 tablet (20 mg total) by mouth daily as needed for erectile dysfunction.  Dispense: 10 tablet; Refill: 11

## 2018-07-26 LAB — RENAL FUNCTION PANEL
Albumin: 4.8 g/dL (ref 3.5–4.8)
BUN / CREAT RATIO: 16 (ref 10–24)
BUN: 15 mg/dL (ref 8–27)
CO2: 26 mmol/L (ref 20–29)
Calcium: 9.4 mg/dL (ref 8.6–10.2)
Chloride: 100 mmol/L (ref 96–106)
Creatinine, Ser: 0.93 mg/dL (ref 0.76–1.27)
GFR, EST AFRICAN AMERICAN: 95 mL/min/{1.73_m2} (ref >59)
GFR, EST NON AFRICAN AMERICAN: 82 mL/min/{1.73_m2} (ref >59)
GLUCOSE: 89 mg/dL (ref 65–99)
PHOSPHORUS: 3.4 mg/dL (ref 2.5–4.5)
POTASSIUM: 4.6 mmol/L (ref 3.5–5.2)
SODIUM: 139 mmol/L (ref 134–144)

## 2018-07-26 LAB — HEPATIC FUNCTION PANEL
ALT: 22 IU/L (ref 0–44)
AST: 20 IU/L (ref 0–40)
Alkaline Phosphatase: 103 IU/L (ref 39–117)
BILIRUBIN, DIRECT: 0.17 mg/dL (ref 0.00–0.40)
Bilirubin Total: 0.6 mg/dL (ref 0.0–1.2)
Total Protein: 7.4 g/dL (ref 6.0–8.5)

## 2018-07-26 LAB — LIPID PANEL WITH LDL/HDL RATIO
Cholesterol, Total: 141 mg/dL (ref 100–199)
HDL: 45 mg/dL (ref >39)
LDL CALC: 78 mg/dL (ref 0–99)
LDl/HDL Ratio: 1.7 ratio (ref 0.0–3.6)
TRIGLYCERIDES: 92 mg/dL (ref 0–149)
VLDL Cholesterol Cal: 18 mg/dL (ref 5–40)

## 2018-07-26 LAB — HEMOGLOBIN A1C
ESTIMATED AVERAGE GLUCOSE: 105 mg/dL
Hgb A1c MFr Bld: 5.3 % (ref 4.8–5.6)

## 2018-07-26 LAB — PSA: Prostate Specific Ag, Serum: 0.7 ng/mL (ref 0.0–4.0)

## 2018-07-28 ENCOUNTER — Other Ambulatory Visit: Payer: Self-pay

## 2018-07-28 ENCOUNTER — Telehealth: Payer: Self-pay

## 2018-07-28 ENCOUNTER — Ambulatory Visit: Payer: Medicare Other | Admitting: Family Medicine

## 2018-07-28 DIAGNOSIS — J019 Acute sinusitis, unspecified: Secondary | ICD-10-CM

## 2018-07-28 MED ORDER — AMOXICILLIN 500 MG PO CAPS: 500.0000 mg | ORAL_CAPSULE | Freq: Three times a day (TID) | ORAL | 0 refills | Status: DC

## 2018-07-28 NOTE — Telephone Encounter (Signed)
Pt called in wanting something for sinus cong- sent in Amox to [facility], told to take something otc for cough. We will need to see if not better

## 2019-01-09 ENCOUNTER — Ambulatory Visit: Payer: Medicare Other

## 2019-01-09 ENCOUNTER — Ambulatory Visit
Admission: EM | Admit: 2019-01-09 | Discharge: 2019-01-09 | Disposition: A | Discharge status: Home / Self Care | Payer: Medicare Other | Attending: Urgent Care | Admitting: Urgent Care

## 2019-01-09 ENCOUNTER — Other Ambulatory Visit: Payer: Self-pay

## 2019-01-09 ENCOUNTER — Encounter: Payer: Self-pay | Admitting: Emergency Medicine

## 2019-01-09 DIAGNOSIS — S5002XA Contusion of left elbow, initial encounter: Secondary | ICD-10-CM | POA: Insufficient documentation

## 2019-01-09 DIAGNOSIS — S59902A Unspecified injury of left elbow, initial encounter: Secondary | ICD-10-CM | POA: Diagnosis not present

## 2019-01-09 DIAGNOSIS — W01198A Fall on same level from slipping, tripping and stumbling with subsequent striking against other object, initial encounter: Secondary | ICD-10-CM | POA: Diagnosis not present

## 2019-01-09 DIAGNOSIS — M7989 Other specified soft tissue disorders: Secondary | ICD-10-CM | POA: Diagnosis not present

## 2019-01-09 MED ORDER — MELOXICAM 7.5 MG PO TABS: 7.5000 mg | ORAL_TABLET | Freq: Every day | ORAL | 0 refills | Status: DC

## 2019-01-09 NOTE — Discharge Instructions (Addendum)
It was very nice meeting you today in clinic. Thank you for entrusting me with your care.   As discussed, you xray was normal. You have a contusion, or deep bruise. Will give you a short course of anti-inflammatory medications. Use heat/ice as needed for comfort.   Please utilize the medications that we discussed. Your prescriptions have been called in to your pharmacy.   Make arrangements to follow up with your regular doctor in 1 week for re-evaluation. If your symptoms/condition worsens, please seek follow up care either here or in the ER. Please remember, our [facility] providers are "right here with you" when you need [place].   Again, it was my pleasure to take care of you today. Thank you for choosing our clinic. I hope that you start to feel better quickly.   Honor Loh, MSN, APRN, FNP-C, CEN Advanced Practice Provider [facility]

## 2019-01-09 NOTE — ED Provider Notes (Signed)
[address], [place],  [address] [phone]   Name: Jeff Mcmahon DOB: Dec 26, 1945 MRN: [id number] CSN: [id number] PCP: Juline Patch, MD  Arrival date and time:  01/09/19 0947  Chief Complaint:  Elbow Pain (left)  NOTE: Prior to seeing the patient today, I have reviewed the triage nursing documentation and vital signs. Clinical staff has updated patient's PMH/PSHx, current medication list, and drug allergies/intolerances to ensure comprehensive history available to assist in medical decision making.   History:   HPI: Jeff Mcmahon is a 73 y.o. male who presents today with complaints of LEFT elbow pain status post a fall x2 weeks ago.  Patient notes that he was going outside to get his mail in his Crocs.  Patient states, "when I stepped on the wet concrete, I slipped and banged my elbow on my car".  Patient notes initial pain and swelling.  Patient using APAP as needed at home.  Over the course of the last 2 weeks, patient has experienced persistent pain.  He states that his elbow "only hurts when I hit that certain spot".  No persistent swelling noted.  Patient decided to come in after trying to play golf this morning.  Patient notes that he tried to swing a golf club and his elbow "did not like it".  Past Medical History:  Diagnosis Date  . Arthritis   . Dysrhythmia 2007   H/O IRREGULAR HEART BEAT-SAW CARDIOLOGIST AND SAID IT WAS NOTHING TO BE CONCERNED WITH  . GERD (gastroesophageal reflux disease)    H/O  . Hyperlipidemia   . Pre-diabetes     Past Surgical History:  Procedure Laterality Date  . COLONOSCOPY    . ESOPHAGOGASTRODUODENOSCOPY    . EXCISION MASS NECK N/A 09/07/2016   Procedure: EXCISION MASS NECK;  Surgeon: Florene Glen, MD;  Location: [facility] ORS;  Service: General;  Laterality: N/A;  . EXCISION OF BACK LESION N/A 09/07/2016   Procedure: EXCISION OF BACK LESION;  Surgeon: Florene Glen, MD;  Location: [facility] ORS;  Service: General;   Laterality: N/A;  . MOUTH SURGERY     dental procedure    Family History  Problem Relation Age of Onset  . Lymphoma Mother 5  . Hypertension Mother   . Cancer Father        lung  . Alcohol abuse Father   . Mental illness Father   . Heart disease Brother     Social History   Socioeconomic History  . Marital status: Married    Spouse name: Not on file  . Number of children: 2  . Years of education: Not on file  . Highest education level: Professional school degree (e.g., MD, DDS, DVM, JD)  Occupational History  . Occupation: Retired  Scientific laboratory technician  . Financial resource strain: Not hard at all  . Food insecurity:    Worry: Never true    Inability: Never true  . Transportation needs:    Medical: No    Non-medical: No  Tobacco Use  . Smoking status: Never Smoker  . Smokeless tobacco: Never Used  . Tobacco comment: smoking cessation materials not required  Substance and Sexual Activity  . Alcohol use: Yes    Comment: social  . Drug use: No  . Sexual activity: Yes  Lifestyle  . Physical activity:    Days per week: 5 days    Minutes per session: 30 min  . Stress: Not at all  Relationships  . Social connections:    Talks on  phone: Patient refused    Gets together: Patient refused    Attends religious service: Patient refused    Active member of club or organization: Patient refused    Attends meetings of clubs or organizations: Patient refused    Relationship status: Married  . Intimate partner violence:    Fear of current or ex partner: No    Emotionally abused: No    Physically abused: No    Forced sexual activity: No  Other Topics Concern  . Not on file  Social History Narrative  . Not on file    Patient Active Problem List   Diagnosis Date Noted  . Osteoarthritis 10/02/2015  . Prediabetes 09/09/2015  . Hx of colonic polyps 09/05/2015  . Hyperlipidemia 09/04/2015  . GERD (gastroesophageal reflux disease) 09/04/2015  . Dry eyes 09/04/2015    Home  Medications:    Current Meds  Medication Sig  . atorvastatin (LIPITOR) 10 MG tablet Take 1 tablet (10 mg total) by mouth daily.  . Glucosamine-Chondroitin 1500-1200 MG/30ML LIQD Take by mouth.  . MULTIPLE VITAMIN PO Take 1 tablet by mouth daily.  . Omega-3 Fatty Acids (FISH OIL) 1000 MG CAPS Take 1 capsule (1,000 mg total) by mouth daily.    Allergies:   Patient has no known allergies.  Review of Systems (ROS): Review of Systems  Constitutional: Negative for chills and fever.  Respiratory: Negative for cough and shortness of breath.   Cardiovascular: Negative for chest pain and palpitations.  Musculoskeletal:       LEFT elbow pain s/p fall x 2 weeks ago  Neurological: Negative for dizziness, syncope and weakness.     Physical Exam:  Triage Vital Signs ED Triage Vitals  Enc Vitals Group     BP 01/09/19 1002 (!) 144/73     Pulse Rate 01/09/19 1002 68     Resp 01/09/19 1002 16     Temp 01/09/19 1002 98.4 F (36.9 C)     Temp Source 01/09/19 1002 Oral     SpO2 01/09/19 1002 100 %     Weight 01/09/19 0959 200 lb (90.7 kg)     Height 01/09/19 0959 5\' 10"  (1.778 m)     Head Circumference --      Peak Flow --      Pain Score 01/09/19 0959 1     Pain Loc --      Pain Edu? --      Excl. in [place]? --     Physical Exam  Constitutional: He is oriented to person, place, and time and well-developed, well-nourished, and in no distress.  HENT:  Head: Normocephalic and atraumatic.  Mouth/Throat: Mucous membranes are normal.  Cardiovascular: Normal rate, regular rhythm, normal heart sounds and intact distal pulses. Exam reveals no gallop and no friction rub.  No murmur heard. Pulmonary/Chest: Effort normal and breath sounds normal. No respiratory distress. He has no wheezes. He has no rales.  Musculoskeletal:     Left elbow: He exhibits normal range of motion, no swelling, no deformity and no laceration. Tenderness found. Radial head and lateral epicondyle tenderness noted.      Comments: (+) PMS distally. Strength and motor function normal. Color and temperature normal.   Neurological: He is alert and oriented to person, place, and time.  Skin: Skin is warm and dry. No rash noted. No erythema.  Psychiatric: Mood, affect and judgment normal.  Nursing note and vitals reviewed.    Urgent Care Treatments / Results:   LABS: PLEASE NOTE:  all labs that were ordered this encounter are listed, however only abnormal results are displayed. Labs Reviewed - No data to display  EKG: -None  RADIOLOGY: Dg Elbow Complete Left  Result Date: 01/09/2019 CLINICAL DATA:  Post slip and fall, injuring elbow. EXAM: LEFT ELBOW - COMPLETE 3+ VIEW COMPARISON:  None. FINDINGS: Mild soft tissue swelling about the posterior aspect of the elbow. No associated fracture or elbow joint effusion. Joint spaces appear preserved. No radiopaque foreign body. IMPRESSION: Mild soft tissue swelling about the posterior aspect of the elbow without associated fracture or elbow joint effusion. Electronically Signed   By: Sandi Mariscal M.D.   On: 01/09/2019 10:21    PRODEDURES: Procedures  MEDICATIONS RECEIVED THIS VISIT: Medications - No data to display  PERTINENT CLINICAL COURSE NOTES/UPDATES:   Initial Impression / Assessment and Plan / Urgent Care Course:    Donato Studley is a 73 y.o. male who presents to [facility] today with complaints of Elbow Pain (left)  Pertinent labs & imaging results that were available during my care of the patient were personally reviewed by me and considered in my medical decision making (see lab/imaging section of note for values and interpretations).  Exam reveals normal full AROM of LEFT elbow; able to fully flex and extend. Diagnostic plain films negative for acute fracture; (+) soft tissue swelling. Reviewed presentation as being consistent with a contusion; education provided. Discussed short term course of anti-inflammatory medication (meloxicam), however  patient refuses citing that he does not wish to take anything beyond APAP. Discussed complimentary treatment using heat and/or ice.   Discussed follow up with primary care physician in 1 week for re-evaluation. I have reviewed the follow up and strict return precautions for any new or worsening symptoms. Patient is aware of symptoms that would be deemed urgent/emergent, and would thus require further evaluation either here or in the emergency department. At the time of discharge, he verbalized understanding and consent with the discharge plan as it was reviewed with him. All questions were fielded by provider and/or clinic staff prior to patient discharge.    Final Clinical Impressions(s) / Urgent Care Diagnoses:   Final diagnoses:  Contusion of left elbow, initial encounter    New Prescriptions:  -None  Controlled Substance Prescriptions:   Controlled Substance Registry consulted? Not Applicable  NOTE: This note was prepared using Dragon dictation software along with smaller phrase technology. Despite my best ability to proofread, there is the potential that transcriptional errors may still occur from this process, and are completely unintentional.     Karen Kitchens, NP 01/10/19 [phone]

## 2019-01-09 NOTE — ED Triage Notes (Addendum)
Patient states that he slipped and and hit his left elbow on the car in his garage 2 weeks ago.  Patient c/o ongoing pain in his left elbow.

## 2019-03-07 ENCOUNTER — Ambulatory Visit: Payer: Medicare Other

## 2019-04-02 DIAGNOSIS — D485 Neoplasm of uncertain behavior of skin: Secondary | ICD-10-CM | POA: Diagnosis not present

## 2019-04-02 DIAGNOSIS — B360 Pityriasis versicolor: Secondary | ICD-10-CM | POA: Diagnosis not present

## 2019-04-02 DIAGNOSIS — L820 Inflamed seborrheic keratosis: Secondary | ICD-10-CM | POA: Diagnosis not present

## 2019-04-02 DIAGNOSIS — L570 Actinic keratosis: Secondary | ICD-10-CM | POA: Diagnosis not present

## 2019-04-04 ENCOUNTER — Ambulatory Visit (INDEPENDENT_AMBULATORY_CARE_PROVIDER_SITE_OTHER): Payer: Medicare Other

## 2019-04-04 VITALS — Temp 97.5°F | Ht 70.0 in | Wt 192.5 lb

## 2019-04-04 DIAGNOSIS — Z0000 Encounter for general adult medical examination without abnormal findings: Secondary | ICD-10-CM

## 2019-04-04 NOTE — Patient Instructions (Signed)
[name] , Thank you for taking time to come for your Medicare Wellness Visit. I appreciate your ongoing commitment to your health goals. Please review the following plan we discussed and let me know if I can assist you in the future.   Screening recommendations/referrals: Colonoscopy: done 05/08/15. Repeat in 2021. Recommended yearly ophthalmology/optometry visit for glaucoma screening and checkup Recommended yearly dental visit for hygiene and checkup  Vaccinations: Influenza vaccine: done 05/03/18 Pneumococcal vaccine: done 09/04/15 Tdap vaccine: done 07/23/18 Shingles vaccine: done 04/09/17  Conditions/risks identified: Keep up the great work!  Next appointment: Please follow up in one year for your Medicare Annual Wellness visit.    Preventive Care 52 Years and Older, Male Preventive care refers to lifestyle choices and visits with your health care provider that can promote health and wellness. What does preventive care include?  A yearly physical exam. This is also called an annual well check.  Dental exams once or twice a year.  Routine eye exams. Ask your health care provider how often you should have your eyes checked.  Personal lifestyle choices, including:  Daily care of your teeth and gums.  Regular physical activity.  Eating a healthy diet.  Avoiding tobacco and drug use.  Limiting alcohol use.  Practicing safe sex.  Taking low doses of aspirin every day.  Taking vitamin and mineral supplements as recommended by your health care provider. What happens during an annual well check? The services and screenings done by your health care provider during your annual well check will depend on your age, overall health, lifestyle risk factors, and family history of disease. Counseling  Your health care provider may ask you questions about your:  Alcohol use.  Tobacco use.  Drug use.  Emotional well-being.  Home and relationship well-being.  Sexual  activity.  Eating habits.  History of falls.  Memory and ability to understand (cognition).  Work and work Statistician. Screening  You may have the following tests or measurements:  Height, weight, and BMI.  Blood pressure.  Lipid and cholesterol levels. These may be checked every 5 years, or more frequently if you are over 7 years old.  Skin check.  Lung cancer screening. You may have this screening every year starting at age 51 if you have a 30-pack-year history of smoking and currently smoke or have quit within the past 15 years.  Fecal occult blood test (FOBT) of the stool. You may have this test every year starting at age 30.  Flexible sigmoidoscopy or colonoscopy. You may have a sigmoidoscopy every 5 years or a colonoscopy every 10 years starting at age 77.  Prostate cancer screening. Recommendations will vary depending on your family history and other risks.  Hepatitis C blood test.  Hepatitis B blood test.  Sexually transmitted disease (STD) testing.  Diabetes screening. This is done by checking your blood sugar (glucose) after you have not eaten for a while (fasting). You may have this done every 1-3 years.  Abdominal aortic aneurysm (AAA) screening. You may need this if you are a current or former smoker.  Osteoporosis. You may be screened starting at age 43 if you are at high risk. Talk with your health care provider about your test results, treatment options, and if necessary, the need for more tests. Vaccines  Your health care provider may recommend certain vaccines, such as:  Influenza vaccine. This is recommended every year.  Tetanus, diphtheria, and acellular pertussis (Tdap, Td) vaccine. You may need a Td booster every 10  years.  Zoster vaccine. You may need this after age 72.  Pneumococcal 13-valent conjugate (PCV13) vaccine. One dose is recommended after age 7.  Pneumococcal polysaccharide (PPSV23) vaccine. One dose is recommended after age 48.  Talk to your health care provider about which screenings and vaccines you need and how often you need them. This information is not intended to replace advice given to you by your health care provider. Make sure you discuss any questions you have with your health care provider. Document Released: 08/22/2015 Document Revised: 04/14/2016 Document Reviewed: 05/27/2015 Elsevier Interactive Patient Education  2017 [place] Prevention in the Home Falls can cause injuries. They can happen to people of all ages. There are many things you can do to make your home safe and to help prevent falls. What can I do on the outside of my home?  Regularly fix the edges of walkways and driveways and fix any cracks.  Remove anything that might make you trip as you walk through a door, such as a raised step or threshold.  Trim any bushes or trees on the path to your home.  Use bright outdoor lighting.  Clear any walking paths of anything that might make someone trip, such as rocks or tools.  Regularly check to see if handrails are loose or broken. Make sure that both sides of any steps have handrails.  Any raised decks and porches should have guardrails on the edges.  Have any leaves, snow, or ice cleared regularly.  Use sand or salt on walking paths during winter.  Clean up any spills in your garage right away. This includes oil or grease spills. What can I do in the bathroom?  Use night lights.  Install grab bars by the toilet and in the tub and shower. Do not use towel bars as grab bars.  Use non-skid mats or decals in the tub or shower.  If you need to sit down in the shower, use a plastic, non-slip stool.  Keep the floor dry. Clean up any water that spills on the floor as soon as it happens.  Remove soap buildup in the tub or shower regularly.  Attach bath mats securely with double-sided non-slip rug tape.  Do not have throw rugs and other things on the floor that can make you  trip. What can I do in the bedroom?  Use night lights.  Make sure that you have a light by your bed that is easy to reach.  Do not use any sheets or blankets that are too big for your bed. They should not hang down onto the floor.  Have a firm chair that has side arms. You can use this for support while you get dressed.  Do not have throw rugs and other things on the floor that can make you trip. What can I do in the kitchen?  Clean up any spills right away.  Avoid walking on wet floors.  Keep items that you use a lot in easy-to-reach places.  If you need to reach something above you, use a strong step stool that has a grab bar.  Keep electrical cords out of the way.  Do not use floor polish or wax that makes floors slippery. If you must use wax, use non-skid floor wax.  Do not have throw rugs and other things on the floor that can make you trip. What can I do with my stairs?  Do not leave any items on the stairs.  Make sure that  there are handrails on both sides of the stairs and use them. Fix handrails that are broken or loose. Make sure that handrails are as long as the stairways.  Check any carpeting to make sure that it is firmly attached to the stairs. Fix any carpet that is loose or worn.  Avoid having throw rugs at the top or bottom of the stairs. If you do have throw rugs, attach them to the floor with carpet tape.  Make sure that you have a light switch at the top of the stairs and the bottom of the stairs. If you do not have them, ask someone to add them for you. What else can I do to help prevent falls?  Wear shoes that:  Do not have high heels.  Have rubber bottoms.  Are comfortable and fit you well.  Are closed at the toe. Do not wear sandals.  If you use a stepladder:  Make sure that it is fully opened. Do not climb a closed stepladder.  Make sure that both sides of the stepladder are locked into place.  Ask someone to hold it for you, if  possible.  Clearly mark and make sure that you can see:  Any grab bars or handrails.  First and last steps.  Where the edge of each step is.  Use tools that help you move around (mobility aids) if they are needed. These include:  Canes.  Walkers.  Scooters.  Crutches.  Turn on the lights when you go into a dark area. Replace any light bulbs as soon as they burn out.  Set up your furniture so you have a clear path. Avoid moving your furniture around.  If any of your floors are uneven, fix them.  If there are any pets around you, be aware of where they are.  Review your medicines with your doctor. Some medicines can make you feel dizzy. This can increase your chance of falling. Ask your doctor what other things that you can do to help prevent falls. This information is not intended to replace advice given to you by your health care provider. Make sure you discuss any questions you have with your health care provider. Document Released: 05/22/2009 Document Revised: 01/01/2016 Document Reviewed: 08/30/2014 Elsevier Interactive Patient Education  2017 [employer].

## 2019-04-04 NOTE — Progress Notes (Signed)
Subjective:   Jeff Mcmahon is a 73 y.o. male who presents for Medicare Annual/Subsequent preventive examination.  Virtual Visit via Telephone Note  I connected with Jeff Mcmahon on 04/04/19 at 10:40 AM EDT by telephone and verified that I am speaking with the correct person using two identifiers.  Medicare Annual Wellness visit completed telephonically due to Covid-19 pandemic.  Location: Patient: home Provider: office   I discussed the limitations, risks, security and privacy concerns of performing an evaluation and management service by telephone and the availability of in person appointments. The patient expressed understanding and agreed to proceed.  Some vital signs may be absent or patient reported.   Clemetine Marker, LPN    Review of Systems:   Cardiac Risk Factors include: advanced age (>77men, >31 women);dyslipidemia;male gender     Objective:    Vitals: Temp (!) 97.5 F (36.4 C)   Ht 5\' 10"  (1.778 m)   Wt 192 lb 8 oz (87.3 kg)   BMI 27.62 kg/m   Body mass index is 27.62 kg/m.  Advanced Directives 04/04/2019 01/09/2019 02/22/2018 05/30/2017 02/21/2017 09/07/2016 08/31/2016  Does Patient Have a Medical Advance Directive? Yes Yes Yes No Yes No No  Type of Paramedic of [place];Living will [place];Living will [place];Living will - [place];Living will - -  Copy of [place] in Chart? Yes - validated most recent copy scanned in chart (See row information) - No - copy requested - Yes - -  Would patient like information on creating a medical advance directive? - - - - - No - Patient declined -    Tobacco Social History   Tobacco Use  Smoking Status Never Smoker  Smokeless Tobacco Never Used  Tobacco Comment   smoking cessation materials not required     Counseling given: Not Answered Comment: smoking cessation materials not required   Clinical Intake:   Pre-visit preparation completed: Yes  Pain : No/denies pain     BMI - recorded: 27.62 Nutritional Status: BMI 25 -29 Overweight Nutritional Risks: None Diabetes: No  How often do you need to have someone help you when you read instructions, pamphlets, or other written materials from your doctor or pharmacy?: 1 - Never  Interpreter Needed?: No  Information entered by :: Clemetine Marker LPN  Past Medical History:  Diagnosis Date  . Arthritis   . Dysrhythmia 2007   H/O IRREGULAR HEART BEAT-SAW CARDIOLOGIST AND SAID IT WAS NOTHING TO BE CONCERNED WITH  . GERD (gastroesophageal reflux disease)    H/O  . Hyperlipidemia   . Pre-diabetes    Past Surgical History:  Procedure Laterality Date  . COLONOSCOPY    . ESOPHAGOGASTRODUODENOSCOPY    . EXCISION MASS NECK N/A 09/07/2016   Procedure: EXCISION MASS NECK;  Surgeon: Florene Glen, MD;  Location: [facility] ORS;  Service: General;  Laterality: N/A;  . EXCISION OF BACK LESION N/A 09/07/2016   Procedure: EXCISION OF BACK LESION;  Surgeon: Florene Glen, MD;  Location: [facility] ORS;  Service: General;  Laterality: N/A;  . MOUTH SURGERY     dental procedure   Family History  Problem Relation Age of Onset  . Lymphoma Mother 19  . Hypertension Mother   . Cancer Father        lung  . Alcohol abuse Father   . Mental illness Father   . Heart disease Brother    Social History   Socioeconomic History  .  Marital status: Married    Spouse name: Not on file  . Number of children: 2  . Years of education: Not on file  . Highest education level: Professional school degree (e.g., MD, DDS, DVM, JD)  Occupational History  . Occupation: Retired  Scientific laboratory technician  . Financial resource strain: Not hard at all  . Food insecurity    Worry: Never true    Inability: Never true  . Transportation needs    Medical: No    Non-medical: No  Tobacco Use  . Smoking status: Never Smoker  . Smokeless tobacco: Never Used  . Tobacco comment: smoking  cessation materials not required  Substance and Sexual Activity  . Alcohol use: Yes    Comment: social  . Drug use: No  . Sexual activity: Not Currently  Lifestyle  . Physical activity    Days per week: 5 days    Minutes per session: 40 min  . Stress: Not at all  Relationships  . Social Herbalist on phone: Patient refused    Gets together: Patient refused    Attends religious service: Patient refused    Active member of club or organization: Patient refused    Attends meetings of clubs or organizations: Patient refused    Relationship status: Married  Other Topics Concern  . Not on file  Social History Narrative  . Not on file    Outpatient Encounter Medications as of 04/04/2019  Medication Sig  . acetaminophen (TYLENOL) 325 MG tablet Take 650 mg by mouth every 6 (six) hours as needed.  Marland Kitchen atorvastatin (LIPITOR) 10 MG tablet Take 1 tablet (10 mg total) by mouth daily.  . carboxymethylcellulose (REFRESH PLUS) 0.5 % SOLN Place 1 drop into both eyes 3 (three) times daily as needed (dry eyes).   . Glucosamine-Chondroitin 1500-1200 MG/30ML LIQD Take by mouth.  . MULTIPLE VITAMIN PO Take 1 tablet by mouth daily.  . Omega-3 Fatty Acids (FISH OIL) 1000 MG CAPS Take 1 capsule (1,000 mg total) by mouth daily.  . sodium chloride (OCEAN) 0.65 % SOLN nasal spray Place 1 spray into both nostrils as needed for congestion.  . tadalafil (ADCIRCA/CIALIS) 20 MG tablet Take 1 tablet (20 mg total) by mouth daily as needed for erectile dysfunction. (Patient not taking: Reported on 04/04/2019)   No facility-administered encounter medications on file as of 04/04/2019.     Activities of Daily Living In your present state of health, do you have any difficulty performing the following activities: 04/04/2019  Hearing? N  Comment declines hearing aids  Vision? N  Difficulty concentrating or making decisions? N  Walking or climbing stairs? N  Dressing or bathing? N  Doing errands, shopping? N   Preparing Food and eating ? N  Using the Toilet? N  In the past six months, have you accidently leaked urine? N  Do you have problems with loss of bowel control? N  Managing your Medications? N  Managing your Finances? N  Housekeeping or managing your Housekeeping? N  Some recent data might be hidden    Patient Care Team: Juline Patch, MD as PCP - General (Family Medicine) Dasher, Rayvon Char, MD as Consulting Physician (Dermatology)   Assessment:   This is a routine wellness examination for Jeff Mcmahon.  Exercise Activities and Dietary recommendations Current Exercise Habits: Home exercise routine, Type of exercise: walking, Time (Minutes): 40, Frequency (Times/Week): 5, Weekly Exercise (Minutes/Week): 200, Intensity: Moderate, Exercise limited by: None identified  Goals    .  DIET - INCREASE WATER INTAKE     Recommend to drink at least 6-8 8oz glasses of water per day.       Fall Risk Fall Risk  04/04/2019 07/25/2018 02/22/2018 02/21/2017 01/12/2017  Falls in the past year? 0 0 No No No  Number falls in past yr: 0 - - - -  Injury with Fall? 0 - - - -  Risk for fall due to : - - Impaired vision - -  Risk for fall due to: Comment - - wears eyeglasses - -  Follow up Falls prevention discussed Falls evaluation completed - - -   FALL RISK PREVENTION PERTAINING TO THE HOME:  Any stairs in or around the home? Yes  If so, do they handrails? Yes   Home free of loose throw rugs in walkways, pet beds, electrical cords, etc? Yes  Adequate lighting in your home to reduce risk of falls? Yes   ASSISTIVE DEVICES UTILIZED TO PREVENT FALLS:  Life alert? No  Use of a cane, walker or w/c? No  Grab bars in the bathroom? No  Shower chair or bench in shower? No  Elevated toilet seat or a handicapped toilet? Yes   DME ORDERS:  DME order needed?  No   TIMED UP AND GO:  Was the test performed? No . Telephonic visit.   Education: Fall risk prevention has been discussed.  Intervention(s)  required? No    Depression Screen PHQ 2/9 Scores 04/04/2019 07/25/2018 02/22/2018 02/21/2017  PHQ - 2 Score 0 0 0 0  PHQ- 9 Score - 0 0 -    Cognitive Function     6CIT Screen 04/04/2019 02/22/2018 02/21/2017  What Year? 0 points 0 points 0 points  What month? 0 points 0 points 0 points  What time? 0 points 0 points 0 points  Count back from 20 0 points 0 points 0 points  Months in reverse 0 points 0 points 0 points  Repeat phrase 0 points 0 points 0 points  Total Score 0 0 0    Immunization History  Administered Date(s) Administered  . Influenza, High Dose Seasonal PF 04/10/2017  . Influenza-Unspecified 04/10/2015, 04/09/2017  . Pneumococcal Conjugate-13 09/04/2011  . Pneumococcal Polysaccharide-23 09/04/2015  . Tdap 07/23/2018  . Zoster 09/04/2015  . Zoster Recombinat (Shingrix) 02/06/2017, 04/09/2017    Qualifies for Shingles Vaccine? Yes  Shingrix series complete.   Tdap: Up to date  Flu Vaccine: Up to date  Pneumococcal Vaccine: Up to date   Screening Tests Health Maintenance  Topic Date Due  . INFLUENZA VACCINE  03/10/2019  . COLONOSCOPY  05/07/2020  . TETANUS/TDAP  07/23/2028  . Hepatitis C Screening  Completed  . PNA vac Low Risk Adult  Completed   Cancer Screenings:  Colorectal Screening: Completed 05/08/15. Repeat every 5 years;   Lung Cancer Screening: (Low Dose CT Chest recommended if Age 1-80 years, 30 pack-year currently smoking OR have quit w/in 15years.) does not qualify.    Additional Screening:  Hepatitis C Screening: does qualify; Completed 12/30/15  Vision Screening: Recommended annual ophthalmology exams for early detection of glaucoma and other disorders of the eye. Is the patient up to date with their annual eye exam?  No  - postponed due to Covid Who is the provider or what is the name of the office in which the pt attends annual eye exams? Dr. Mallie Mussel  Dental Screening: Recommended annual dental exams for proper oral hygiene   Community Resource Referral:  CRR required this visit?  Yes       Plan:    I have personally reviewed and addressed the Medicare Annual Wellness questionnaire and have noted the following in the patient's chart:  A. Medical and social history B. Use of alcohol, tobacco or illicit drugs  C. Current medications and supplements D. Functional ability and status E.  Nutritional status F.  Physical activity G. Advance directives H. List of other physicians I.  Hospitalizations, surgeries, and ER visits in previous 12 months J.  [place] such as hearing and vision if needed, cognitive and depression L. Referrals and appointments   In addition, I have reviewed and discussed with patient certain preventive protocols, quality metrics, and best practice recommendations. A written personalized care plan for preventive services as well as general preventive health recommendations were provided to patient.   Signed,  Clemetine Marker, LPN Nurse Health Advisor   Nurse Notes: pt doing well and appreciative of visit today

## 2019-04-26 ENCOUNTER — Other Ambulatory Visit: Payer: Self-pay | Admitting: Family Medicine

## 2019-05-16 ENCOUNTER — Other Ambulatory Visit: Payer: Self-pay

## 2019-05-16 ENCOUNTER — Ambulatory Visit (INDEPENDENT_AMBULATORY_CARE_PROVIDER_SITE_OTHER): Payer: Medicare Other | Admitting: Family Medicine

## 2019-05-16 ENCOUNTER — Encounter: Payer: Self-pay | Admitting: Family Medicine

## 2019-05-16 VITALS — BP 132/78 | HR 78 | Resp 16 | Ht 70.0 in | Wt 189.0 lb

## 2019-05-16 DIAGNOSIS — E785 Hyperlipidemia, unspecified: Secondary | ICD-10-CM | POA: Diagnosis not present

## 2019-05-16 DIAGNOSIS — R7303 Prediabetes: Secondary | ICD-10-CM

## 2019-05-16 DIAGNOSIS — R351 Nocturia: Secondary | ICD-10-CM

## 2019-05-16 MED ORDER — ATORVASTATIN CALCIUM 10 MG PO TABS: 10.0000 mg | ORAL_TABLET | Freq: Every day | ORAL | 1 refills | Status: DC

## 2019-05-16 NOTE — Progress Notes (Signed)
Date:  05/16/2019   Name:  Jeff Mcmahon   DOB:  Jan 05, 1946   MRN:  [id number]   Chief Complaint: Hyperlipidemia and Erectile Dysfunction (no longer saking rx)  Hyperlipidemia This is a chronic problem. The current episode started more than 1 year ago. The problem is controlled. Recent lipid tests were reviewed and are normal. He has no history of chronic renal disease, diabetes, hypothyroidism, liver disease, obesity or nephrotic syndrome. Pertinent negatives include no chest pain, leg pain, myalgias or shortness of breath. Current antihyperlipidemic treatment includes statins. The current treatment provides significant improvement of lipids. There are no compliance problems.  Risk factors: prediabetes.  Diabetes He presents for his follow-up diabetic visit. Diabetes type: prediabetes. His disease course has been stable. There are no hypoglycemic associated symptoms. Pertinent negatives for hypoglycemia include no dizziness, headaches or nervousness/anxiousness. Pertinent negatives for diabetes include no chest pain and no polydipsia. There are no hypoglycemic complications. Symptoms are stable. There are no diabetic complications. Risk factors for coronary artery disease include dyslipidemia. Current diabetic treatment includes diet. He is compliant with treatment some of the time.    Review of Systems  Constitutional: Negative for chills and fever.  HENT: Negative for drooling, ear discharge, ear pain and sore throat.   Respiratory: Negative for cough, shortness of breath and wheezing.   Cardiovascular: Negative for chest pain, palpitations and leg swelling.  Gastrointestinal: Negative for abdominal pain, blood in stool, constipation, diarrhea and nausea.  Endocrine: Negative for polydipsia.  Genitourinary: Negative for dysuria, frequency, hematuria and urgency.  Musculoskeletal: Negative for back pain, myalgias and neck pain.  Skin: Negative for rash.  Allergic/Immunologic: Negative  for environmental allergies.  Neurological: Negative for dizziness and headaches.  Hematological: Does not bruise/bleed easily.  Psychiatric/Behavioral: Negative for suicidal ideas. The patient is not nervous/anxious.     Patient Active Problem List   Diagnosis Date Noted  . Osteoarthritis 10/02/2015  . Prediabetes 09/09/2015  . Hx of colonic polyps 09/05/2015  . Hyperlipidemia 09/04/2015  . GERD (gastroesophageal reflux disease) 09/04/2015  . Dry eyes 09/04/2015    No Known Allergies  Past Surgical History:  Procedure Laterality Date  . COLONOSCOPY    . ESOPHAGOGASTRODUODENOSCOPY    . EXCISION MASS NECK N/A 09/07/2016   Procedure: EXCISION MASS NECK;  Surgeon: Florene Glen, MD;  Location: [facility];  Service: General;  Laterality: N/A;  . EXCISION OF BACK LESION N/A 09/07/2016   Procedure: EXCISION OF BACK LESION;  Surgeon: Florene Glen, MD;  Location: [facility] OR[facility];  Service: General;  Laterality: N/A;  . MOUTH SURGERY     dental procedure    Social History   Tobacco Use  . Smoking status: Never Smoker  . Smokeless tobacco: Never Used  . Tobacco comment: smoking cessation materials not required  Substance Use Topics  . Alcohol use: Yes    Comment: social  . Drug use: No     Medication list has been reviewed and updated.  Current Meds  Medication Sig  . acetaminophen (TYLENOL) 325 MG tablet Take 650 mg by mouth every 6 (six) hours as needed.  Marland Kitchen atorvastatin (LIPITOR) 10 MG tablet TAKE 1 TABLET BY MOUTH EVERY DAY  . carboxymethylcellulose (REFRESH PLUS) 0.5 % SOLN Place 1 drop into both eyes 3 (three) times daily as needed (dry eyes).   . Glucosamine-Chondroitin 1500-1200 MG/30ML LIQD Take by mouth.  . MULTIPLE VITAMIN PO Take 1 tablet by mouth daily. Also taking Airborne Vit C  . Omega-3 Fatty  Acids (FISH OIL) 1000 MG CAPS Take 1 capsule (1,000 mg total) by mouth daily.  . sodium chloride (OCEAN) 0.65 % SOLN nasal spray Place 1 spray into both nostrils as needed  for congestion.    PHQ 2/9 Scores 04/04/2019 07/25/2018 02/22/2018 02/21/2017  PHQ - 2 Score 0 0 0 0  PHQ- 9 Score - 0 0 -    BP Readings from Last 3 Encounters:  05/16/19 132/78  01/09/19 (!) 144/73  07/25/18 120/80    Physical Exam Vitals signs and nursing note reviewed.  HENT:     Head: Normocephalic.     Right Ear: External ear normal.     Left Ear: External ear normal.     Nose: Nose normal.  Eyes:     General: No scleral icterus.       Right eye: No discharge.        Left eye: No discharge.     Conjunctiva/sclera: Conjunctivae normal.     Pupils: Pupils are equal, round, and reactive to light.  Neck:     Musculoskeletal: Normal range of motion and neck supple.     Thyroid: No thyromegaly.     Vascular: No JVD.     Trachea: No tracheal deviation.  Cardiovascular:     Rate and Rhythm: Normal rate and regular rhythm.     Pulses:          Dorsalis pedis pulses are 2+ on the right side and 2+ on the left side.       Posterior tibial pulses are 2+ on the right side and 2+ on the left side.     Heart sounds: Normal heart sounds. No murmur. No friction rub. No gallop.   Pulmonary:     Effort: No respiratory distress.     Breath sounds: Normal breath sounds. No wheezing or rales.  Abdominal:     General: Bowel sounds are normal.     Palpations: Abdomen is soft. There is no mass.     Tenderness: There is no abdominal tenderness. There is no guarding or rebound.  Musculoskeletal: Normal range of motion.        General: No tenderness.  Feet:     Right foot:     Protective Sensation: 10 sites tested. 10 sites sensed.     Skin integrity: Dry skin present.     Toenail Condition: Right toenails are normal.     Left foot:     Protective Sensation: 10 sites tested. 10 sites sensed.     Skin integrity: Dry skin present.     Toenail Condition: Left toenails are normal.  Lymphadenopathy:     Cervical: No cervical adenopathy.  Skin:    General: Skin is warm.     Findings: No  rash.  Neurological:     Mental Status: He is alert and oriented to person, place, and time.     Cranial Nerves: No cranial nerve deficit.     Deep Tendon Reflexes: Reflexes are normal and symmetric.     Wt Readings from Last 3 Encounters:  05/16/19 189 lb (85.7 kg)  04/04/19 192 lb 8 oz (87.3 kg)  01/09/19 200 lb (90.7 kg)    BP 132/78   Pulse 78   Resp 16   Ht 5\' 10"  (1.778 m)   Wt 189 lb (85.7 kg)   BMI 27.12 kg/m   Assessment and Plan:  1. Hyperlipidemia, unspecified hyperlipidemia type Chronic.  Excellent control.  Check lipid panel and renal function panel.  Will recheck patient in 6 months - Lipid panel - Renal Function Panel  2. Prediabetes Patient had 1 reading of 5.7 elevated A1c in 2017.  Patient has been undergoing a weight loss with a diet plan that is been successful.  We will check an A1c. - Hemoglobin A1c  3. Nocturia Patient states "I always get up at night" but no other signs or symptoms of prostate concern but will check a PSA for baseline. - PSA

## 2019-05-17 LAB — RENAL FUNCTION PANEL
Albumin: 4.6 g/dL (ref 3.7–4.7)
BUN/Creatinine Ratio: 12 (ref 10–24)
BUN: 11 mg/dL (ref 8–27)
CO2: 27 mmol/L (ref 20–29)
Calcium: 9.6 mg/dL (ref 8.6–10.2)
Chloride: 99 mmol/L (ref 96–106)
Creatinine, Ser: 0.91 mg/dL (ref 0.76–1.27)
GFR calc Af Amer: 98 mL/min/{1.73_m2} (ref >59)
GFR calc non Af Amer: 84 mL/min/{1.73_m2} (ref >59)
Glucose: 82 mg/dL (ref 65–99)
Phosphorus: 2.8 mg/dL (ref 2.8–4.1)
Potassium: 4.7 mmol/L (ref 3.5–5.2)
Sodium: 138 mmol/L (ref 134–144)

## 2019-05-17 LAB — LIPID PANEL
Chol/HDL Ratio: 2.7 ratio (ref 0.0–5.0)
Cholesterol, Total: 107 mg/dL (ref 100–199)
HDL: 39 mg/dL — ABNORMAL LOW (ref >39)
LDL Chol Calc (NIH): 54 mg/dL (ref 0–99)
Triglycerides: 66 mg/dL (ref 0–149)
VLDL Cholesterol Cal: 14 mg/dL (ref 5–40)

## 2019-05-17 LAB — HEMOGLOBIN A1C
Est. average glucose Bld gHb Est-mCnc: 108 mg/dL
Hgb A1c MFr Bld: 5.4 % (ref 4.8–5.6)

## 2019-05-17 LAB — PSA: Prostate Specific Ag, Serum: 0.6 ng/mL (ref 0.0–4.0)

## 2019-07-27 ENCOUNTER — Ambulatory Visit: Payer: Medicare Other | Admitting: Family Medicine

## 2019-11-19 ENCOUNTER — Encounter: Payer: Self-pay | Admitting: Family Medicine

## 2019-11-19 ENCOUNTER — Ambulatory Visit (INDEPENDENT_AMBULATORY_CARE_PROVIDER_SITE_OTHER): Payer: Medicare Other | Admitting: Family Medicine

## 2019-11-19 ENCOUNTER — Other Ambulatory Visit: Payer: Self-pay

## 2019-11-19 VITALS — BP 120/80 | HR 80 | Ht 70.0 in | Wt 175.0 lb

## 2019-11-19 DIAGNOSIS — Z1211 Encounter for screening for malignant neoplasm of colon: Secondary | ICD-10-CM | POA: Diagnosis not present

## 2019-11-19 DIAGNOSIS — E7800 Pure hypercholesterolemia, unspecified: Secondary | ICD-10-CM

## 2019-11-19 DIAGNOSIS — H04123 Dry eye syndrome of bilateral lacrimal glands: Secondary | ICD-10-CM | POA: Diagnosis not present

## 2019-11-19 NOTE — Progress Notes (Signed)
Date:  11/19/2019   Name:  Jeff Mcmahon   DOB:  Sep 04, 1945   MRN:  [id number]   Chief Complaint: Hyperlipidemia and ref colonoscopy ([place])  Hyperlipidemia This is a chronic problem. The current episode started more than 1 year ago. The problem is controlled. He has no history of chronic renal disease, diabetes, hypothyroidism, liver disease, obesity or nephrotic syndrome. There are no known factors aggravating his hyperlipidemia. Pertinent negatives include no chest pain, focal sensory loss, focal weakness, leg pain, myalgias or shortness of breath. Current antihyperlipidemic treatment includes statins. The current treatment provides moderate improvement of lipids. There are no compliance problems.     Lab Results  Component Value Date   CREATININE 0.91 05/16/2019   BUN 11 05/16/2019   NA 138 05/16/2019   K 4.7 05/16/2019   CL 99 05/16/2019   CO2 27 05/16/2019   Lab Results  Component Value Date   CHOL 107 05/16/2019   HDL 39 (L) 05/16/2019   LDLCALC 54 05/16/2019   TRIG 66 05/16/2019   CHOLHDL 2.7 05/16/2019   Lab Results  Component Value Date   TSH 2.430 07/22/2017   Lab Results  Component Value Date   HGBA1C 5.4 05/16/2019   Lab Results  Component Value Date   WBC 5.3 07/22/2017   HGB 16.0 07/22/2017   HCT 47.7 07/22/2017   MCV 96 07/22/2017   PLT 236 07/22/2017   Lab Results  Component Value Date   ALT 22 07/25/2018   AST 20 07/25/2018   ALKPHOS 103 07/25/2018   BILITOT 0.6 07/25/2018     Review of Systems  Constitutional: Negative for chills and fever.  HENT: Negative for drooling, ear discharge, ear pain, postnasal drip, rhinorrhea and sore throat.   Respiratory: Negative for cough, shortness of breath and wheezing.   Cardiovascular: Negative for chest pain, palpitations and leg swelling.  Gastrointestinal: Negative for abdominal pain, blood in stool, constipation, diarrhea and nausea.  Endocrine: Negative for polydipsia.  Genitourinary:  Negative for dysuria, frequency, hematuria and urgency.  Musculoskeletal: Negative for back pain, myalgias and neck pain.  Skin: Negative for rash.  Allergic/Immunologic: Negative for environmental allergies.  Neurological: Negative for dizziness, focal weakness and headaches.  Hematological: Does not bruise/bleed easily.  Psychiatric/Behavioral: Negative for suicidal ideas. The patient is not nervous/anxious.     Patient Active Problem List   Diagnosis Date Noted  . Osteoarthritis 10/02/2015  . Prediabetes 09/09/2015  . Hx of colonic polyps 09/05/2015  . Hyperlipidemia 09/04/2015  . GERD (gastroesophageal reflux disease) 09/04/2015  . Dry eyes 09/04/2015    No Known Allergies  Past Surgical History:  Procedure Laterality Date  . COLONOSCOPY    . ESOPHAGOGASTRODUODENOSCOPY    . EXCISION MASS NECK N/A 09/07/2016   Procedure: EXCISION MASS NECK;  Surgeon: Florene Glen, MD;  Location: [facility];  Service: General;  Laterality: N/A;  . EXCISION OF BACK LESION N/A 09/07/2016   Procedure: EXCISION OF BACK LESION;  Surgeon: Florene Glen, MD;  Location: [facility];  Service: General;  Laterality: N/A;  . MOUTH SURGERY     dental procedure    Social History   Tobacco Use  . Smoking status: Never Smoker  . Smokeless tobacco: Never Used  . Tobacco comment: smoking cessation materials not required  Substance Use Topics  . Alcohol use: Yes    Comment: social  . Drug use: No     Medication list has been reviewed and updated.  Current Meds  Medication  Sig  . acetaminophen (TYLENOL) 325 MG tablet Take 650 mg by mouth every 6 (six) hours as needed.  Marland Kitchen atorvastatin (LIPITOR) 10 MG tablet Take 1 tablet (10 mg total) by mouth daily.  . carboxymethylcellulose (REFRESH PLUS) 0.5 % SOLN Place 1 drop into both eyes 3 (three) times daily as needed (dry eyes).   . Glucosamine-Chondroitin 1500-1200 MG/30ML LIQD Take by mouth.  . MULTIPLE VITAMIN PO Take 1 tablet by mouth daily. Also  taking Airborne Vit C  . Omega-3 Fatty Acids (FISH OIL) 1000 MG CAPS Take 1 capsule (1,000 mg total) by mouth daily.  . sodium chloride (OCEAN) 0.65 % SOLN nasal spray Place 1 spray into both nostrils as needed for congestion.    PHQ 2/9 Scores 11/19/2019 04/04/2019 07/25/2018 02/22/2018  PHQ - 2 Score 0 0 0 0  PHQ- 9 Score 0 - 0 0    BP Readings from Last 3 Encounters:  11/19/19 120/80  05/16/19 132/78  01/09/19 (!) 144/73    Physical Exam Vitals and nursing note reviewed.  Constitutional:      Appearance: Normal appearance.  HENT:     Head: Normocephalic.     Right Ear: Tympanic membrane, ear canal and external ear normal.     Left Ear: Tympanic membrane, ear canal and external ear normal.     Nose: Nose normal.  Eyes:     General: No scleral icterus.       Right eye: No discharge.        Left eye: No discharge.     Conjunctiva/sclera: Conjunctivae normal.     Pupils: Pupils are equal, round, and reactive to light.  Neck:     Thyroid: No thyromegaly.     Vascular: No JVD.     Trachea: No tracheal deviation.  Cardiovascular:     Rate and Rhythm: Normal rate and regular rhythm.     Heart sounds: Normal heart sounds. No murmur. No friction rub. No gallop.   Pulmonary:     Effort: No respiratory distress.     Breath sounds: Normal breath sounds. No wheezing, rhonchi or rales.  Abdominal:     General: Bowel sounds are normal.     Palpations: Abdomen is soft. There is no mass.     Tenderness: There is no abdominal tenderness. There is no guarding or rebound.  Musculoskeletal:        General: No tenderness. Normal range of motion.     Cervical back: Normal range of motion and neck supple.  Lymphadenopathy:     Cervical: No cervical adenopathy.  Skin:    General: Skin is warm.     Findings: No rash.  Neurological:     Mental Status: He is alert and oriented to person, place, and time.     Cranial Nerves: No cranial nerve deficit.     Deep Tendon Reflexes: Reflexes are  normal and symmetric.     Wt Readings from Last 3 Encounters:  11/19/19 175 lb (79.4 kg)  05/16/19 189 lb (85.7 kg)  04/04/19 192 lb 8 oz (87.3 kg)    BP 120/80   Pulse 80   Ht 5\' 10"  (1.778 m)   Wt 175 lb (79.4 kg)   BMI 25.11 kg/m   Assessment and Plan:  1. Colon cancer screening Discussed and patient agrees and we will refer to gastroenterology for 5-year follow-up colonoscopy. - Ambulatory referral to Gastroenterology  2. Pure hypercholesterolemia Chronic.  Controlled.  Patient has excellent range on statin agent.  However is so good we are going to discontinue atorvastatin and I have discussed total cholesterol versus LDL and LDL has stayed relatively okay and if the ratio is acceptable we will continue up in the meantime patient has been given a Mediterranean diet uses guidelines.  3. Dry eyes Patient is currently taking omega-3 for dry eyes and this may be contributing to the control of his hyperlipidemia.

## 2019-11-19 NOTE — Patient Instructions (Signed)

## 2019-11-22 ENCOUNTER — Other Ambulatory Visit: Payer: Self-pay

## 2019-12-01 ENCOUNTER — Other Ambulatory Visit: Payer: Self-pay | Admitting: Family Medicine

## 2019-12-31 ENCOUNTER — Other Ambulatory Visit: Payer: Self-pay

## 2019-12-31 DIAGNOSIS — E7800 Pure hypercholesterolemia, unspecified: Secondary | ICD-10-CM

## 2020-01-01 LAB — LIPID PANEL
Chol/HDL Ratio: 3.9 ratio (ref 0.0–5.0)
Cholesterol, Total: 172 mg/dL (ref 100–199)
HDL: 44 mg/dL (ref >39)
LDL Chol Calc (NIH): 113 mg/dL — ABNORMAL HIGH (ref 0–99)
Triglycerides: 79 mg/dL (ref 0–149)
VLDL Cholesterol Cal: 15 mg/dL (ref 5–40)

## 2020-02-04 ENCOUNTER — Ambulatory Visit (INDEPENDENT_AMBULATORY_CARE_PROVIDER_SITE_OTHER): Payer: Self-pay | Admitting: Gastroenterology

## 2020-02-04 ENCOUNTER — Other Ambulatory Visit: Payer: Self-pay

## 2020-02-04 DIAGNOSIS — Z1211 Encounter for screening for malignant neoplasm of colon: Secondary | ICD-10-CM

## 2020-02-04 DIAGNOSIS — Z86010 Personal history of colonic polyps: Secondary | ICD-10-CM

## 2020-02-04 NOTE — Progress Notes (Signed)
Gastroenterology Pre-Procedure Review  Request Date: Monday 05/26/20 Requesting Physician: Dr. Marius Ditch  PATIENT REVIEW QUESTIONS: The patient responded to the following health history questions as indicated:    1. Are you having any GI issues? no 2. Do you have a personal history of Polyps? yes (Colon Polyp noted on 2016 colonoscopy report) 3. Do you have a family history of Colon Cancer or Polyps? no 4. Diabetes Mellitus? no 5. Joint replacements in the past 12 months?no 6. Major health problems in the past 3 months?no 7. Any artificial heart valves, MVP, or defibrillator?no    MEDICATIONS & ALLERGIES:    Patient reports the following regarding taking any anticoagulation/antiplatelet therapy:   Plavix, Coumadin, Eliquis, Xarelto, Lovenox, Pradaxa, Brilinta, or Effient? no Aspirin? no  Patient confirms/reports the following medications:  Current Outpatient Medications  Medication Sig Dispense Refill  . acetaminophen (TYLENOL) 325 MG tablet Take 650 mg by mouth every 6 (six) hours as needed.    . carboxymethylcellulose (REFRESH PLUS) 0.5 % SOLN Place 1 drop into both eyes 3 (three) times daily as needed (dry eyes).     . Glucosamine-Chondroitin 1500-1200 MG/30ML LIQD Take by mouth.    . MULTIPLE VITAMIN PO Take 1 tablet by mouth daily. Also taking Airborne Vit C    . Omega-3 Fatty Acids (FISH OIL) 1000 MG CAPS Take 1 capsule (1,000 mg total) by mouth daily. 90 capsule 3  . sodium chloride (OCEAN) 0.65 % SOLN nasal spray Place 1 spray into both nostrils as needed for congestion. (Patient not taking: Reported on 02/04/2020)     No current facility-administered medications for this visit.    Patient confirms/reports the following allergies:  No Known Allergies  No orders of the defined types were placed in this encounter.   AUTHORIZATION INFORMATION Primary Insurance: 1D#: Group #:  Secondary Insurance: 1D#: Group #:  SCHEDULE INFORMATION: Date: Monday  05/26/20 Time: Location:[facility]

## 2020-02-05 ENCOUNTER — Telehealth: Payer: Self-pay

## 2020-02-05 NOTE — Telephone Encounter (Signed)
Informed patient to stop the Metamucil 3 days before colonoscopy. Patient verbalized understanding

## 2020-03-10 ENCOUNTER — Telehealth: Payer: Self-pay

## 2020-03-10 NOTE — Telephone Encounter (Signed)
Patient contacted the office to request to rescheduled his 10/18 colonoscopy with Dr. Marius Ditch to either 10/25 or 10/27.  Date has been changed to 06/02/20 with Dr. Marius Ditch at [facility].  Pt has been advised of COVID date Thursday 05/29/20.  Almyra Free in endoscopy has been notified of date change.  Referral updated.  New instructions sent.  Thanks,  [place], [place]

## 2020-03-31 DIAGNOSIS — X32XXXA Exposure to sunlight, initial encounter: Secondary | ICD-10-CM | POA: Diagnosis not present

## 2020-03-31 DIAGNOSIS — L821 Other seborrheic keratosis: Secondary | ICD-10-CM | POA: Diagnosis not present

## 2020-03-31 DIAGNOSIS — D2271 Melanocytic nevi of right lower limb, including hip: Secondary | ICD-10-CM | POA: Diagnosis not present

## 2020-03-31 DIAGNOSIS — D2262 Melanocytic nevi of left upper limb, including shoulder: Secondary | ICD-10-CM | POA: Diagnosis not present

## 2020-03-31 DIAGNOSIS — D2272 Melanocytic nevi of left lower limb, including hip: Secondary | ICD-10-CM | POA: Diagnosis not present

## 2020-03-31 DIAGNOSIS — D225 Melanocytic nevi of trunk: Secondary | ICD-10-CM | POA: Diagnosis not present

## 2020-03-31 DIAGNOSIS — L570 Actinic keratosis: Secondary | ICD-10-CM | POA: Diagnosis not present

## 2020-03-31 DIAGNOSIS — D2261 Melanocytic nevi of right upper limb, including shoulder: Secondary | ICD-10-CM | POA: Diagnosis not present

## 2020-04-03 DIAGNOSIS — Z20822 Contact with and (suspected) exposure to covid-19: Secondary | ICD-10-CM | POA: Diagnosis not present

## 2020-04-07 ENCOUNTER — Other Ambulatory Visit: Payer: Self-pay

## 2020-04-07 ENCOUNTER — Ambulatory Visit (INDEPENDENT_AMBULATORY_CARE_PROVIDER_SITE_OTHER): Payer: Medicare Other

## 2020-04-07 VITALS — BP 114/66 | HR 74 | Temp 98.2°F | Resp 16 | Ht 70.0 in | Wt 177.0 lb

## 2020-04-07 DIAGNOSIS — Z0000 Encounter for general adult medical examination without abnormal findings: Secondary | ICD-10-CM

## 2020-04-07 NOTE — Progress Notes (Signed)
Subjective:   Jeff Mcmahon is a 74 y.o. male who presents for Medicare Annual/Subsequent preventive examination.  Review of Systems     Cardiac Risk Factors include: advanced age (>20men, >63 women);male gender;dyslipidemia     Objective:    Today's Vitals   04/07/20 1042  BP: 114/66  Pulse: 74  Resp: 16  Temp: 98.2 F (36.8 C)  TempSrc: Oral  SpO2: 96%  Weight: 177 lb (80.3 kg)  Height: 5\' 10"  (1.778 m)   Body mass index is 25.4 kg/m.  Advanced Directives 04/07/2020 04/04/2019 01/09/2019 02/22/2018 05/30/2017 02/21/2017 09/07/2016  Does Patient Have a Medical Advance Directive? Yes Yes Yes Yes No Yes No  Type of Paramedic of [place];Living will [place];Living will [place];Living will [place];Living will - [place];Living will -  Copy of [place] in Chart? Yes - validated most recent copy scanned in chart (See row information) Yes - validated most recent copy scanned in chart (See row information) - No - copy requested - Yes -  Would patient like information on creating a medical advance directive? - - - - - - No - Patient declined    Current Medications (verified) Outpatient Encounter Medications as of 04/07/2020  Medication Sig  . acetaminophen (TYLENOL) 325 MG tablet Take 650 mg by mouth every 6 (six) hours as needed.  . carboxymethylcellulose (REFRESH PLUS) 0.5 % SOLN Place 1 drop into both eyes 3 (three) times daily as needed (dry eyes).   . Glucosamine-Chondroitin 1500-1200 MG/30ML LIQD Take by mouth.  . MULTIPLE VITAMIN PO Take 1 tablet by mouth daily. Also taking Airborne Vit C  . Multiple Vitamins-Minerals (AIRBORNE PO) Take by mouth.  . Omega-3 Fatty Acids (FISH OIL) 1000 MG CAPS Take 1 capsule (1,000 mg total) by mouth daily.  . sodium chloride (OCEAN) 0.65 % SOLN nasal spray Place 1 spray into both nostrils as needed for congestion.    No  facility-administered encounter medications on file as of 04/07/2020.    Allergies (verified) Patient has no known allergies.   History: Past Medical History:  Diagnosis Date  . Arthritis   . Dysrhythmia 2007   H/O IRREGULAR HEART BEAT-SAW CARDIOLOGIST AND SAID IT WAS NOTHING TO BE CONCERNED WITH  . GERD (gastroesophageal reflux disease)    H/O  . Hyperlipidemia   . Pre-diabetes    Past Surgical History:  Procedure Laterality Date  . COLONOSCOPY    . ESOPHAGOGASTRODUODENOSCOPY    . EXCISION MASS NECK N/A 09/07/2016   Procedure: EXCISION MASS NECK;  Surgeon: Florene Glen, MD;  Location: [facility] ORS;  Service: General;  Laterality: N/A;  . EXCISION OF BACK LESION N/A 09/07/2016   Procedure: EXCISION OF BACK LESION;  Surgeon: Florene Glen, MD;  Location: [facility] ORS;  Service: General;  Laterality: N/A;  . MOUTH SURGERY     dental procedure   Family History  Problem Relation Age of Onset  . Lymphoma Mother 19  . Hypertension Mother   . Cancer Father        lung  . Alcohol abuse Father   . Mental illness Father   . Heart disease Brother    Social History   Socioeconomic History  . Marital status: Married    Spouse name: Not on file  . Number of children: 2  . Years of education: Not on file  . Highest education level: Professional school degree (e.g., MD, DDS, DVM, JD)  Occupational History  . Occupation: Retired  Tobacco Use  . Smoking status: Never Smoker  . Smokeless tobacco: Never Used  . Tobacco comment: smoking cessation materials not required  Vaping Use  . Vaping Use: Never used  Substance and Sexual Activity  . Alcohol use: Yes    Comment: social  . Drug use: No  . Sexual activity: Not Currently  Other Topics Concern  . Not on file  Social History Narrative  . Not on file   Social Determinants of Health   Financial Resource Strain: Low Risk   . Difficulty of Paying Living Expenses: Not hard at all  Food Insecurity: No Food Insecurity  . Worried  About Charity fundraiser in the Last Year: Never true  . Ran Out of Food in the Last Year: Never true  Transportation Needs: No Transportation Needs  . Lack of Transportation (Medical): No  . Lack of Transportation (Non-Medical): No  Physical Activity: Sufficiently Active  . Days of Exercise per Week: 5 days  . Minutes of Exercise per Session: 40 min  Stress: No Stress Concern Present  . Feeling of Stress : Not at all  Social Connections: Moderately Integrated  . Frequency of Communication with Friends and Family: More than three times a week  . Frequency of Social Gatherings with Friends and Family: Three times a week  . Attends Religious Services: More than 4 times per year  . Active Member of Clubs or Organizations: No  . Attends Archivist Meetings: Never  . Marital Status: Married    Tobacco Counseling Counseling given: Not Answered Comment: smoking cessation materials not required   Clinical Intake:  Pre-visit preparation completed: Yes  Pain : No/denies pain     BMI - recorded: 25.4 Nutritional Status: BMI 25 -29 Overweight Nutritional Risks: None Diabetes: No  How often do you need to have someone help you when you read instructions, pamphlets, or other written materials from your doctor or pharmacy?: 1 - Never    Interpreter Needed?: No  Information entered by :: Clemetine Marker LPN   Activities of Daily Living In your present state of health, do you have any difficulty performing the following activities: 04/07/2020  Hearing? N  Comment declines hearing aids  Vision? N  Difficulty concentrating or making decisions? N  Walking or climbing stairs? N  Dressing or bathing? N  Doing errands, shopping? N  Preparing Food and eating ? N  Using the Toilet? N  In the past six months, have you accidently leaked urine? N  Do you have problems with loss of bowel control? N  Managing your Medications? N  Managing your Finances? N  Housekeeping or  managing your Housekeeping? N  Some recent data might be hidden    Patient Care Team: Juline Patch, MD as PCP - General (Family Medicine) Dasher, Rayvon Char, MD as Consulting Physician (Dermatology)  Indicate any recent Medical Services you may have received from other than [facility] providers in the past year (date may be approximate).     Assessment:   This is a routine wellness examination for [place].  Hearing/Vision screen  Hearing Screening   125Hz  250Hz  500Hz  1000Hz  2000Hz  3000Hz  4000Hz  6000Hz  8000Hz   Right ear:           Left ear:           Comments: Pt denies hearing difficulty  Vision Screening Comments: Annual vision screenings with [facility] Dr. Mallie Mussel  Dietary issues and exercise activities  discussed: Current Exercise Habits: Home exercise routine, Type of exercise: walking;Other - see comments (golf), Time (Minutes): 40, Frequency (Times/Week): 5, Weekly Exercise (Minutes/Week): 200, Intensity: Moderate, Exercise limited by: None identified  Goals    . DIET - INCREASE WATER INTAKE     Recommend to drink at least 6-8 8oz glasses of water per day.      Depression Screen PHQ 2/9 Scores 04/07/2020 11/19/2019 04/04/2019 07/25/2018 02/22/2018 02/21/2017 01/12/2017  PHQ - 2 Score 0 0 0 0 0 0 0  PHQ- 9 Score - 0 - 0 0 - -    Fall Risk Fall Risk  04/07/2020 11/19/2019 04/04/2019 07/25/2018 02/22/2018  Falls in the past year? 0 0 0 0 No  Number falls in past yr: 0 - 0 - -  Injury with Fall? 0 - 0 - -  Risk for fall due to : No Fall Risks - - - Impaired vision  Risk for fall due to: Comment - - - - wears eyeglasses  Follow up Falls prevention discussed Falls evaluation completed Falls prevention discussed Falls evaluation completed -    Any stairs in or around the home? Yes  If so, are there any without handrails? No  Home free of loose throw rugs in walkways, pet beds, electrical cords, etc? Yes  Adequate lighting in your home to reduce risk of falls? Yes   ASSISTIVE  DEVICES UTILIZED TO PREVENT FALLS:  Life alert? No  Use of a cane, walker or w/c? No  Grab bars in the bathroom? No  Shower chair or bench in shower? No  Elevated toilet seat or a handicapped toilet? Yes   TIMED UP AND GO:  Was the test performed? Yes .  Length of time to ambulate 10 feet: 4 sec.   Gait steady and fast without use of assistive device  Cognitive Function:     6CIT Screen 04/07/2020 04/04/2019 02/22/2018 02/21/2017  What Year? 0 points 0 points 0 points 0 points  What month? 0 points 0 points 0 points 0 points  What time? 0 points 0 points 0 points 0 points  Count back from 20 0 points 0 points 0 points 0 points  Months in reverse 0 points 0 points 0 points 0 points  Repeat phrase 0 points 0 points 0 points 0 points  Total Score 0 0 0 0    Immunizations Immunization History  Administered Date(s) Administered  . Influenza, High Dose Seasonal PF 04/10/2017, 04/15/2019  . Influenza-Unspecified 04/10/2015, 04/09/2017, 04/09/2018  . PFIZER SARS-COV-2 Vaccination 08/29/2019, 09/26/2019  . Pneumococcal Conjugate-13 09/04/2011  . Pneumococcal Polysaccharide-23 09/04/2015  . Tdap 07/23/2018  . Zoster 09/04/2015  . Zoster Recombinat (Shingrix) 02/06/2017, 04/09/2017    TDAP status: Up to date   Flu Vaccine status: Up to date   Pneumococcal vaccine status: Up to date   Covid-19 vaccine status: Completed vaccines  Qualifies for Shingles Vaccine? Yes   Zostavax completed Yes   Shingrix Completed?: Yes  Screening Tests Health Maintenance  Topic Date Due  . INFLUENZA VACCINE  03/09/2020  . COLONOSCOPY  05/07/2020  . TETANUS/TDAP  07/23/2028  . COVID-19 Vaccine  Completed  . Hepatitis C Screening  Completed  . PNA vac Low Risk Adult  Completed    Health Maintenance  Health Maintenance Due  Topic Date Due  . INFLUENZA VACCINE  03/09/2020    Colorectal cancer screening: Completed 05/08/15. Repeat every 5 years . Scheduled for 06/02/20.  Lung Cancer  Screening: (Low Dose CT Chest recommended if  Age 62-80 years, 30 pack-year currently smoking OR have quit w/in 15years.) does not qualify.    Additional Screening:  Hepatitis C Screening: does qualify; Completed 12/30/15  Vision Screening: Recommended annual ophthalmology exams for early detection of glaucoma and other disorders of the eye. Is the patient up to date with their annual eye exam?  Yes  Who is the provider or what is the name of the office in which the patient attends annual eye exams? Dr. Mallie Mussel  Dental Screening: Recommended annual dental exams for proper oral hygiene  Community Resource Referral / Chronic Care Management: CRR required this visit?  No   CCM required this visit?  No      Plan:     I have personally reviewed and noted the following in the patient's chart:   . Medical and social history . Use of alcohol, tobacco or illicit drugs  . Current medications and supplements . Functional ability and status . Nutritional status . Physical activity . Advanced directives . List of other physicians . Hospitalizations, surgeries, and ER visits in previous 12 months . Vitals . Screenings to include cognitive, depression, and falls . Referrals and appointments  In addition, I have reviewed and discussed with patient certain preventive protocols, quality metrics, and best practice recommendations. A written personalized care plan for preventive services as well as general preventive health recommendations were provided to patient.     Clemetine Marker, LPN   1/61/0960   Nurse Notes: pt doing well and appreciative of visit today

## 2020-04-07 NOTE — Patient Instructions (Signed)
[name] , Thank you for taking time to come for your Medicare Wellness Visit. I appreciate your ongoing commitment to your health goals. Please review the following plan we discussed and let me know if I can assist you in the future.   Screening recommendations/referrals: Colonoscopy: done 05/08/15. Scheduled for 06/02/20 Recommended yearly ophthalmology/optometry visit for glaucoma screening and checkup Recommended yearly dental visit for hygiene and checkup  Vaccinations: Influenza vaccine: done 04/29/19 Pneumococcal vaccine: done 09/04/15 Tdap vaccine: done 07/23/18 Shingles vaccine: done 02/06/17 & 04/09/17   Covid-19: done 08/29/19 & 09/26/19  Conditions/risks identified: Keep up the great work!  Next appointment: Follow up in one year for your annual wellness visit.   Preventive Care 19 Years and Older, Male Preventive care refers to lifestyle choices and visits with your health care provider that can promote health and wellness. What does preventive care include?  A yearly physical exam. This is also called an annual well check.  Dental exams once or twice a year.  Routine eye exams. Ask your health care provider how often you should have your eyes checked.  Personal lifestyle choices, including:  Daily care of your teeth and gums.  Regular physical activity.  Eating a healthy diet.  Avoiding tobacco and drug use.  Limiting alcohol use.  Practicing safe sex.  Taking low doses of aspirin every day.  Taking vitamin and mineral supplements as recommended by your health care provider. What happens during an annual well check? The services and screenings done by your health care provider during your annual well check will depend on your age, overall health, lifestyle risk factors, and family history of disease. Counseling  Your health care provider may ask you questions about your:  Alcohol use.  Tobacco use.  Drug use.  Emotional well-being.  Home and  relationship well-being.  Sexual activity.  Eating habits.  History of falls.  Memory and ability to understand (cognition).  Work and work Statistician. Screening  You may have the following tests or measurements:  Height, weight, and BMI.  Blood pressure.  Lipid and cholesterol levels. These may be checked every 5 years, or more frequently if you are over 28 years old.  Skin check.  Lung cancer screening. You may have this screening every year starting at age 59 if you have a 30-pack-year history of smoking and currently smoke or have quit within the past 15 years.  Fecal occult blood test (FOBT) of the stool. You may have this test every year starting at age 24.  Flexible sigmoidoscopy or colonoscopy. You may have a sigmoidoscopy every 5 years or a colonoscopy every 10 years starting at age 47.  Prostate cancer screening. Recommendations will vary depending on your family history and other risks.  Hepatitis C blood test.  Hepatitis B blood test.  Sexually transmitted disease (STD) testing.  Diabetes screening. This is done by checking your blood sugar (glucose) after you have not eaten for a while (fasting). You may have this done every 1-3 years.  Abdominal aortic aneurysm (AAA) screening. You may need this if you are a current or former smoker.  Osteoporosis. You may be screened starting at age 21 if you are at high risk. Talk with your health care provider about your test results, treatment options, and if necessary, the need for more tests. Vaccines  Your health care provider may recommend certain vaccines, such as:  Influenza vaccine. This is recommended every year.  Tetanus, diphtheria, and acellular pertussis (Tdap, Td) vaccine. You may  need a Td booster every 10 years.  Zoster vaccine. You may need this after age 60.  Pneumococcal 13-valent conjugate (PCV13) vaccine. One dose is recommended after age 31.  Pneumococcal polysaccharide (PPSV23) vaccine. One  dose is recommended after age 33. Talk to your health care provider about which screenings and vaccines you need and how often you need them. This information is not intended to replace advice given to you by your health care provider. Make sure you discuss any questions you have with your health care provider. Document Released: 08/22/2015 Document Revised: 04/14/2016 Document Reviewed: 05/27/2015 Elsevier Interactive Patient Education  2017 [place] Prevention in the Home Falls can cause injuries. They can happen to people of all ages. There are many things you can do to make your home safe and to help prevent falls. What can I do on the outside of my home?  Regularly fix the edges of walkways and driveways and fix any cracks.  Remove anything that might make you trip as you walk through a door, such as a raised step or threshold.  Trim any bushes or trees on the path to your home.  Use bright outdoor lighting.  Clear any walking paths of anything that might make someone trip, such as rocks or tools.  Regularly check to see if handrails are loose or broken. Make sure that both sides of any steps have handrails.  Any raised decks and porches should have guardrails on the edges.  Have any leaves, snow, or ice cleared regularly.  Use sand or salt on walking paths during winter.  Clean up any spills in your garage right away. This includes oil or grease spills. What can I do in the bathroom?  Use night lights.  Install grab bars by the toilet and in the tub and shower. Do not use towel bars as grab bars.  Use non-skid mats or decals in the tub or shower.  If you need to sit down in the shower, use a plastic, non-slip stool.  Keep the floor dry. Clean up any water that spills on the floor as soon as it happens.  Remove soap buildup in the tub or shower regularly.  Attach bath mats securely with double-sided non-slip rug tape.  Do not have throw rugs and other  things on the floor that can make you trip. What can I do in the bedroom?  Use night lights.  Make sure that you have a light by your bed that is easy to reach.  Do not use any sheets or blankets that are too big for your bed. They should not hang down onto the floor.  Have a firm chair that has side arms. You can use this for support while you get dressed.  Do not have throw rugs and other things on the floor that can make you trip. What can I do in the kitchen?  Clean up any spills right away.  Avoid walking on wet floors.  Keep items that you use a lot in easy-to-reach places.  If you need to reach something above you, use a strong step stool that has a grab bar.  Keep electrical cords out of the way.  Do not use floor polish or wax that makes floors slippery. If you must use wax, use non-skid floor wax.  Do not have throw rugs and other things on the floor that can make you trip. What can I do with my stairs?  Do not leave any items on  the stairs.  Make sure that there are handrails on both sides of the stairs and use them. Fix handrails that are broken or loose. Make sure that handrails are as long as the stairways.  Check any carpeting to make sure that it is firmly attached to the stairs. Fix any carpet that is loose or worn.  Avoid having throw rugs at the top or bottom of the stairs. If you do have throw rugs, attach them to the floor with carpet tape.  Make sure that you have a light switch at the top of the stairs and the bottom of the stairs. If you do not have them, ask someone to add them for you. What else can I do to help prevent falls?  Wear shoes that:  Do not have high heels.  Have rubber bottoms.  Are comfortable and fit you well.  Are closed at the toe. Do not wear sandals.  If you use a stepladder:  Make sure that it is fully opened. Do not climb a closed stepladder.  Make sure that both sides of the stepladder are locked into place.  Ask  someone to hold it for you, if possible.  Clearly mark and make sure that you can see:  Any grab bars or handrails.  First and last steps.  Where the edge of each step is.  Use tools that help you move around (mobility aids) if they are needed. These include:  Canes.  Walkers.  Scooters.  Crutches.  Turn on the lights when you go into a dark area. Replace any light bulbs as soon as they burn out.  Set up your furniture so you have a clear path. Avoid moving your furniture around.  If any of your floors are uneven, fix them.  If there are any pets around you, be aware of where they are.  Review your medicines with your doctor. Some medicines can make you feel dizzy. This can increase your chance of falling. Ask your doctor what other things that you can do to help prevent falls. This information is not intended to replace advice given to you by your health care provider. Make sure you discuss any questions you have with your health care provider. Document Released: 05/22/2009 Document Revised: 01/01/2016 Document Reviewed: 08/30/2014 Elsevier Interactive Patient Education  2017 [employer].

## 2020-04-17 DIAGNOSIS — S46011A Strain of muscle(s) and tendon(s) of the rotator cuff of right shoulder, initial encounter: Secondary | ICD-10-CM | POA: Diagnosis not present

## 2020-05-02 DIAGNOSIS — Z23 Encounter for immunization: Secondary | ICD-10-CM | POA: Diagnosis not present

## 2020-05-22 ENCOUNTER — Telehealth: Payer: Self-pay

## 2020-05-22 NOTE — Telephone Encounter (Signed)
Returned patients call. Patient wanted to know if he needed an appointment for the covid test. Explained to him that he doesn't need an appointment. Pt verbalized understanding.

## 2020-05-29 ENCOUNTER — Other Ambulatory Visit
Admission: RE | Admit: 2020-05-29 | Discharge: 2020-05-29 | Disposition: A | Discharge status: Home / Self Care | Payer: Medicare Other | Source: Ambulatory Visit | Attending: Gastroenterology | Admitting: Gastroenterology

## 2020-05-29 ENCOUNTER — Other Ambulatory Visit: Payer: Self-pay

## 2020-05-29 DIAGNOSIS — Z20822 Contact with and (suspected) exposure to covid-19: Secondary | ICD-10-CM | POA: Insufficient documentation

## 2020-05-29 DIAGNOSIS — Z01812 Encounter for preprocedural laboratory examination: Secondary | ICD-10-CM | POA: Insufficient documentation

## 2020-05-29 LAB — SARS CORONAVIRUS 2 (TAT 6-24 HRS): SARS Coronavirus 2: NEGATIVE

## 2020-05-30 ENCOUNTER — Encounter: Payer: Self-pay | Admitting: Gastroenterology

## 2020-06-02 ENCOUNTER — Ambulatory Visit: Payer: Medicare Other | Admitting: Registered Nurse

## 2020-06-02 ENCOUNTER — Other Ambulatory Visit: Payer: Self-pay

## 2020-06-02 ENCOUNTER — Encounter
Admission: RE | Disposition: A | Discharge status: Home / Self Care | Payer: Self-pay | Source: Home / Self Care | Attending: Gastroenterology

## 2020-06-02 ENCOUNTER — Encounter: Payer: Self-pay | Admitting: Gastroenterology

## 2020-06-02 ENCOUNTER — Ambulatory Visit
Admission: RE | Admit: 2020-06-02 | Discharge: 2020-06-02 | Disposition: A | Discharge status: Home / Self Care | Payer: Medicare Other | Attending: Gastroenterology | Admitting: Gastroenterology

## 2020-06-02 DIAGNOSIS — D120 Benign neoplasm of cecum: Secondary | ICD-10-CM | POA: Diagnosis not present

## 2020-06-02 DIAGNOSIS — D122 Benign neoplasm of ascending colon: Secondary | ICD-10-CM | POA: Insufficient documentation

## 2020-06-02 DIAGNOSIS — Z1211 Encounter for screening for malignant neoplasm of colon: Secondary | ICD-10-CM | POA: Diagnosis not present

## 2020-06-02 DIAGNOSIS — D123 Benign neoplasm of transverse colon: Secondary | ICD-10-CM | POA: Insufficient documentation

## 2020-06-02 DIAGNOSIS — Z79899 Other long term (current) drug therapy: Secondary | ICD-10-CM | POA: Diagnosis not present

## 2020-06-02 DIAGNOSIS — Z860100 Personal history of colon polyps, unspecified: Secondary | ICD-10-CM

## 2020-06-02 DIAGNOSIS — K635 Polyp of colon: Secondary | ICD-10-CM | POA: Diagnosis not present

## 2020-06-02 HISTORY — PX: COLONOSCOPY WITH PROPOFOL: SHX5780

## 2020-06-02 SURGERY — COLONOSCOPY WITH PROPOFOL
Anesthesia: General

## 2020-06-02 MED ORDER — SODIUM CHLORIDE 0.9 % IV SOLN
INTRAVENOUS | Status: DC
  Administered 2020-06-02: 20 mL/h via INTRAVENOUS

## 2020-06-02 MED ORDER — PROPOFOL 500 MG/50ML IV EMUL
INTRAVENOUS | Status: DC | PRN
  Administered 2020-06-02: 140 ug/kg/min via INTRAVENOUS

## 2020-06-02 MED ORDER — LIDOCAINE HCL (CARDIAC) PF 100 MG/5ML IV SOSY
PREFILLED_SYRINGE | INTRAVENOUS | Status: DC | PRN
  Administered 2020-06-02: 60 mg via INTRAVENOUS

## 2020-06-02 MED ORDER — PROPOFOL 10 MG/ML IV BOLUS
INTRAVENOUS | Status: DC | PRN
  Administered 2020-06-02: 70 mg via INTRAVENOUS
  Administered 2020-06-02: 30 mg via INTRAVENOUS

## 2020-06-02 NOTE — Anesthesia Postprocedure Evaluation (Signed)
Anesthesia Post Note  Patient: Jeff Mcmahon  Procedure(s) Performed: COLONOSCOPY WITH PROPOFOL (N/A )  Patient location during evaluation: Endoscopy Anesthesia Type: General Level of consciousness: awake and alert Pain management: pain level controlled Vital Signs Assessment: post-procedure vital signs reviewed and stable Respiratory status: spontaneous breathing, nonlabored ventilation, respiratory function stable and patient connected to nasal cannula oxygen Cardiovascular status: blood pressure returned to baseline and stable Postop Assessment: no apparent nausea or vomiting Anesthetic complications: no   No complications documented.   Last Vitals:  Vitals:   06/02/20 0854 06/02/20 0904  BP: 109/71 113/67  Pulse: 63 (!) 53  Resp: (!) 21 (!) 9  Temp:    SpO2: 98% 98%    Last Pain:  Vitals:   06/02/20 0904  TempSrc:   PainSc: 0-No pain                 Martha Clan

## 2020-06-02 NOTE — Anesthesia Preprocedure Evaluation (Signed)
Anesthesia Evaluation  Patient identified by MRN, date of birth, ID band Patient awake    Reviewed: Allergy & Precautions, H&P , NPO status , Patient's Chart, lab work & pertinent test results, reviewed documented beta blocker date and time   History of Anesthesia Complications Negative for: history of anesthetic complications  Airway Mallampati: III  TM Distance: >3 FB Neck ROM: full    Dental no notable dental hx. (+) Implants   Pulmonary neg pulmonary ROS, neg shortness of breath, neg sleep apnea, neg COPD, neg recent URI,           Cardiovascular Exercise Tolerance: Good (-) hypertension(-) angina(-) CAD, (-) Past MI, (-) Cardiac Stents and (-) CABG + dysrhythmias (-) Valvular Problems/Murmurs     Neuro/Psych negative neurological ROS  negative psych ROS   GI/Hepatic Neg liver ROS, GERD  Controlled,  Endo/Other  negative endocrine ROS  Renal/GU negative Renal ROS  negative genitourinary   Musculoskeletal   Abdominal   Peds  Hematology negative hematology ROS (+)   Anesthesia Other Findings Past Medical History: No date: Arthritis 2007: Dysrhythmia     Comment: H/O IRREGULAR HEART BEAT-SAW CARDIOLOGIST AND               SAID IT WAS NOTHING TO BE CONCERNED WITH No date: GERD (gastroesophageal reflux disease)     Comment: H/O No date: Hyperlipidemia No date: Pre-diabetes   Reproductive/Obstetrics negative OB ROS                             Anesthesia Physical  Anesthesia Plan  ASA: II  Anesthesia Plan: General   Post-op Pain Management:    Induction: Intravenous  PONV Risk Score and Plan: 2 and Propofol infusion and TIVA  Airway Management Planned: Natural Airway and Nasal Cannula  Additional Equipment:   Intra-op Plan:   Post-operative Plan:   Informed Consent: I have reviewed the patients History and Physical, chart, labs and discussed the procedure including the  risks, benefits and alternatives for the proposed anesthesia with the patient or authorized representative who has indicated his/her understanding and acceptance.     Dental Advisory Given  Plan Discussed with: Anesthesiologist, CRNA and Surgeon  Anesthesia Plan Comments:         Anesthesia Quick Evaluation

## 2020-06-02 NOTE — H&P (Signed)
Cephas Darby, MD [address]  [place]  [place], [place] [address]  Main: [phone]  Fax: [phone] Pager: [phone]  Primary Care Physician:  Juline Patch, MD Primary Gastroenterologist:  Dr. Cephas Darby  Pre-Procedure History & Physical: HPI:  Jeff Mcmahon is a 74 y.o. male is here for an colonoscopy.   Past Medical History:  Diagnosis Date  . Arthritis   . Dysrhythmia 2007   H/O IRREGULAR HEART BEAT-SAW CARDIOLOGIST AND SAID IT WAS NOTHING TO BE CONCERNED WITH  . GERD (gastroesophageal reflux disease)    H/O  . Hyperlipidemia   . Pre-diabetes     Past Surgical History:  Procedure Laterality Date  . COLONOSCOPY    . ESOPHAGOGASTRODUODENOSCOPY    . EXCISION MASS NECK N/A 09/07/2016   Procedure: EXCISION MASS NECK;  Surgeon: Florene Glen, MD;  Location: [facility] ORS;  Service: General;  Laterality: N/A;  . EXCISION OF BACK LESION N/A 09/07/2016   Procedure: EXCISION OF BACK LESION;  Surgeon: Florene Glen, MD;  Location: [facility] ORS;  Service: General;  Laterality: N/A;  . MOUTH SURGERY     dental procedure    Prior to Admission medications   Medication Sig Start Date End Date Taking? Authorizing Provider  acetaminophen (TYLENOL) 325 MG tablet Take 650 mg by mouth every 6 (six) hours as needed.   Yes [provider]  carboxymethylcellulose (REFRESH PLUS) 0.5 % SOLN Place 1 drop into both eyes 3 (three) times daily as needed (dry eyes).    Yes [provider]  Glucosamine-Chondroitin 1500-1200 MG/30ML LIQD Take by mouth.   Yes [provider]  MULTIPLE VITAMIN PO Take 1 tablet by mouth daily. Also taking Airborne Vit C   Yes [provider]  Multiple Vitamins-Minerals (AIRBORNE PO) Take by mouth.   Yes [provider]  Omega-3 Fatty Acids (FISH OIL) 1000 MG CAPS Take 1 capsule (1,000 mg total) by mouth daily. 07/25/18  Yes Juline Patch, MD  sodium chloride (OCEAN) 0.65 % SOLN nasal spray Place 1 spray  into both nostrils as needed for congestion.    Yes [provider]    Allergies as of 02/04/2020  . (No Known Allergies)    Family History  Problem Relation Age of Onset  . Lymphoma Mother 29  . Hypertension Mother   . Cancer Father        lung  . Alcohol abuse Father   . Mental illness Father   . Heart disease Brother     Social History   Socioeconomic History  . Marital status: Married    Spouse name: Not on file  . Number of children: 2  . Years of education: Not on file  . Highest education level: Professional school degree (e.g., MD, DDS, DVM, JD)  Occupational History  . Occupation: Retired  Tobacco Use  . Smoking status: Never Smoker  . Smokeless tobacco: Never Used  . Tobacco comment: smoking cessation materials not required  Vaping Use  . Vaping Use: Never used  Substance and Sexual Activity  . Alcohol use: Yes    Comment: social  . Drug use: No  . Sexual activity: Not Currently  Other Topics Concern  . Not on file  Social History Narrative  . Not on file   Social Determinants of Health   Financial Resource Strain: Low Risk   . Difficulty of Paying Living Expenses: Not hard at all  Food Insecurity: No Food Insecurity  . Worried About Running  Out of Food in the Last Year: Never true  . Ran Out of Food in the Last Year: Never true  Transportation Needs: No Transportation Needs  . Lack of Transportation (Medical): No  . Lack of Transportation (Non-Medical): No  Physical Activity: Sufficiently Active  . Days of Exercise per Week: 5 days  . Minutes of Exercise per Session: 40 min  Stress: No Stress Concern Present  . Feeling of Stress : Not at all  Social Connections: Moderately Integrated  . Frequency of Communication with Friends and Family: More than three times a week  . Frequency of Social Gatherings with Friends and Family: Three times a week  . Attends Religious Services: More than 4 times per year  . Active Member of Clubs or  Organizations: No  . Attends Archivist Meetings: Never  . Marital Status: Married  Human resources officer Violence: Not At Risk  . Fear of Current or Ex-Partner: No  . Emotionally Abused: No  . Physically Abused: No  . Sexually Abused: No    Review of Systems: See HPI, otherwise negative ROS  Physical Exam: BP 121/78   Pulse 72   Temp 98.1 F (36.7 C) (Temporal)   Resp 16   Ht 5\' 10"  (1.778 m)   Wt 76.7 kg   SpO2 98%   BMI 24.25 kg/m  General:   Alert,  pleasant and cooperative in NAD Head:  Normocephalic and atraumatic. Neck:  Supple; no masses or thyromegaly. Lungs:  Clear throughout to auscultation.    Heart:  Regular rate and rhythm. Abdomen:  Soft, nontender and nondistended. Normal bowel sounds, without guarding, and without rebound.   Neurologic:  Alert and  oriented x4;  grossly normal neurologically.  Impression/Plan: Jeff Mcmahon is here for an colonoscopy to be performed for personal h/o tubular adenoma colon  Risks, benefits, limitations, and alternatives regarding  colonoscopy have been reviewed with the patient.  Questions have been answered.  All parties agreeable.   Sherri Sear, MD  06/02/2020, 8:02 AM

## 2020-06-02 NOTE — Transfer of Care (Signed)
Immediate Anesthesia Transfer of Care Note  Patient: Jeff Mcmahon  Procedure(s) Performed: COLONOSCOPY WITH PROPOFOL (N/A )  Patient Location: PACU  Anesthesia Type:General  Level of Consciousness: awake, alert  and oriented  Airway & Oxygen Therapy: Patient Spontanous Breathing  Post-op Assessment: Report given to RN and Post -op Vital signs reviewed and stable  Post vital signs: Reviewed and stable  Last Vitals:  Vitals Value Taken Time  BP 96/62 06/02/20 0834  Temp 36.4 C 06/02/20 0834  Pulse 64 06/02/20 0834  Resp 12 06/02/20 0834  SpO2 97 % 06/02/20 0834  Vitals shown include unvalidated device data.  Last Pain:  Vitals:   06/02/20 0834  TempSrc:   PainSc: Asleep         Complications: No complications documented.

## 2020-06-02 NOTE — Op Note (Signed)
[facility] Gastroenterology Patient Name: Jeff Mcmahon Procedure Date: 06/02/2020 8:03 AM MRN: [id number] Account #: [id number] Date of Birth: 10/19/1945 Admit Type: Outpatient Age: 74 Room: [facility] ENDO ROOM 1 Gender: Male Note Status: Finalized Procedure:             Colonoscopy Indications:           Surveillance: Personal history of adenomatous polyps                         on last colonoscopy 5 years ago Providers:             Lin Landsman MD, MD Referring MD:          Juline Patch, MD (Referring MD) Medicines:             General Anesthesia Complications:         No immediate complications. Estimated blood loss: None. Procedure:             Pre-Anesthesia Assessment:                        - Prior to the procedure, a History and Physical was                         performed, and patient medications and allergies were                         reviewed. The patient is competent. The risks and                         benefits of the procedure and the sedation options and                         risks were discussed with the patient. All questions                         were answered and informed consent was obtained.                         Patient identification and proposed procedure were                         verified by the physician, the nurse, the                         anesthesiologist, the anesthetist and the technician                         in the pre-procedure area in the procedure room in the                         endoscopy suite. Mental Status Examination: alert and                         oriented. Airway Examination: normal oropharyngeal                         airway and neck mobility. Respiratory Examination:  clear to auscultation. CV Examination: normal.                         Prophylactic Antibiotics: The patient does not require                         prophylactic antibiotics. Prior Anticoagulants:  The                         patient has taken no previous anticoagulant or                         antiplatelet agents. ASA Grade Assessment: II - A                         patient with mild systemic disease. After reviewing                         the risks and benefits, the patient was deemed in                         satisfactory condition to undergo the procedure. The                         anesthesia plan was to use general anesthesia.                         Immediately prior to administration of medications,                         the patient was re-assessed for adequacy to receive                         sedatives. The heart rate, respiratory rate, oxygen                         saturations, blood pressure, adequacy of pulmonary                         ventilation, and response to care were monitored                         throughout the procedure. The physical status of the                         patient was re-assessed after the procedure.                        After obtaining informed consent, the colonoscope was                         passed under direct vision. Throughout the procedure,                         the patient's blood pressure, pulse, and oxygen                         saturations were monitored continuously. The  Colonoscope was introduced through the anus and                         advanced to the the cecum, identified by appendiceal                         orifice and ileocecal valve. The colonoscopy was                         performed without difficulty. The patient tolerated                         the procedure well. The quality of the bowel                         preparation was evaluated using the BBPS [facility] Bowel                         Preparation Scale) with scores of: Right Colon = 3,                         Transverse Colon = 3 and Left Colon = 3 (entire mucosa                         seen well with no residual  staining, small fragments                         of stool or opaque liquid). The total BBPS score                         equals 9. Findings:      The perianal and digital rectal examinations were normal. Pertinent       negatives include normal sphincter tone and no palpable rectal lesions.      A diminutive polyp was found in the cecum. The polyp was sessile. The       polyp was removed with a cold biopsy forceps. Resection and retrieval       were complete.      Two sessile polyps were found in the transverse colon and ascending       colon. The polyps were 3 to 4 mm in size. These polyps were removed with       a cold snare. Resection and retrieval were complete.      The retroflexed view of the distal rectum and anal verge was normal and       showed no anal or rectal abnormalities. Impression:            - One diminutive polyp in the cecum, removed with a                         cold biopsy forceps. Resected and retrieved.                        - Two 3 to 4 mm polyps in the transverse colon and in                         the ascending colon, removed with a cold snare.  Resected and retrieved.                        - The distal rectum and anal verge are normal on                         retroflexion view. Recommendation:        - Discharge patient to home (with escort).                        - Resume previous diet today.                        - Continue present medications.                        - Await pathology results.                        - Repeat colonoscopy in 5 years for surveillance. Procedure Code(s):     --- Professional ---                        [id number], Colonoscopy, flexible; with removal of                         tumor(s), polyp(s), or other lesion(s) by snare                         technique                        45380, 47, Colonoscopy, flexible; with biopsy, single                         or multiple Diagnosis Code(s):     ---  Professional ---                        K63.5, Polyp of colon                        Z86.010, Personal history of colonic polyps CPT copyright 2019 American Medical Association. All rights reserved. The codes documented in this report are preliminary and upon coder review may  be revised to meet current compliance requirements. Dr. Ulyess Mort Lin Landsman MD, MD 06/02/2020 8:33:00 AM This report has been signed electronically. Number of Addenda: 0 Note Initiated On: 06/02/2020 8:03 AM Scope Withdrawal Time: 0 hours 12 minutes 49 seconds  Total Procedure Duration: 0 hours 15 minutes 53 seconds  Estimated Blood Loss:  Estimated blood loss: none.      [facility]

## 2020-06-03 LAB — SURGICAL PATHOLOGY

## 2020-06-04 ENCOUNTER — Encounter: Payer: Self-pay | Admitting: Gastroenterology

## 2020-07-01 ENCOUNTER — Encounter: Payer: Self-pay | Admitting: Family Medicine

## 2020-07-01 ENCOUNTER — Other Ambulatory Visit: Payer: Self-pay

## 2020-07-01 ENCOUNTER — Ambulatory Visit (INDEPENDENT_AMBULATORY_CARE_PROVIDER_SITE_OTHER): Payer: Medicare Other | Admitting: Family Medicine

## 2020-07-01 VITALS — BP 120/70 | HR 72 | Ht 70.0 in | Wt 177.0 lb

## 2020-07-01 DIAGNOSIS — E7800 Pure hypercholesterolemia, unspecified: Secondary | ICD-10-CM | POA: Diagnosis not present

## 2020-07-01 DIAGNOSIS — R7303 Prediabetes: Secondary | ICD-10-CM | POA: Diagnosis not present

## 2020-07-01 DIAGNOSIS — R1314 Dysphagia, pharyngoesophageal phase: Secondary | ICD-10-CM | POA: Diagnosis not present

## 2020-07-01 NOTE — Progress Notes (Addendum)
Date:  07/01/2020   Name:  Jeff Mcmahon   DOB:  Apr 20, 1946   MRN:  [id number]   Chief Complaint: Hyperlipidemia  Hyperlipidemia This is a chronic problem. The current episode started more than 1 year ago. The problem is controlled. He has no history of chronic renal disease, diabetes, hypothyroidism, liver disease, obesity or nephrotic syndrome. There are no known factors aggravating his hyperlipidemia. Pertinent negatives include no chest pain, focal sensory loss, focal weakness, leg pain, myalgias or shortness of breath. Treatments tried: diet/omega 3. There are no compliance problems.   GI Problem Primary symptoms do not include fever, weight loss, fatigue, abdominal pain, nausea, vomiting, diarrhea, melena, hematemesis, jaundice, hematochezia, dysuria, myalgias, arthralgias or rash. The onset was gradual. The problem has been gradually worsening.  The illness is also significant for dysphagia. The illness does not include chills, anorexia, odynophagia, bloating, constipation, tenesmus, back pain or itching. Associated medical issues do not include inflammatory bowel disease, GERD, gallstones, liver disease, alcohol abuse, PUD, gastric bypass, bowel resection, irritable bowel syndrome, hemorrhoids or diverticulitis. Associated medical issues comments: previous endoscopy 2011 for stricture/[place].  Diabetes He presents for his follow-up diabetic visit. Diabetes type: prediabetes. His disease course has been stable. Pertinent negatives for hypoglycemia include no dizziness, headaches or nervousness/anxiousness. Pertinent negatives for diabetes include no blurred vision, no chest pain, no fatigue, no foot ulcerations, no polydipsia, no polyphagia, no visual change, no weakness and no weight loss. There are no hypoglycemic complications. There are no diabetic complications. There are no known risk factors for coronary artery disease. Current diabetic treatment includes diet.    Lab  Results  Component Value Date   CREATININE 0.91 05/16/2019   BUN 11 05/16/2019   NA 138 05/16/2019   K 4.7 05/16/2019   CL 99 05/16/2019   CO2 27 05/16/2019   Lab Results  Component Value Date   CHOL 172 12/31/2019   HDL 44 12/31/2019   LDLCALC 113 (H) 12/31/2019   TRIG 79 12/31/2019   CHOLHDL 3.9 12/31/2019   Lab Results  Component Value Date   TSH 2.430 07/22/2017   Lab Results  Component Value Date   HGBA1C 5.4 05/16/2019   Lab Results  Component Value Date   WBC 5.3 07/22/2017   HGB 16.0 07/22/2017   HCT 47.7 07/22/2017   MCV 96 07/22/2017   PLT 236 07/22/2017   Lab Results  Component Value Date   ALT 22 07/25/2018   AST 20 07/25/2018   ALKPHOS 103 07/25/2018   BILITOT 0.6 07/25/2018     Review of Systems  Constitutional: Negative for chills, fatigue, fever and weight loss.  HENT: Negative for drooling, ear discharge, ear pain and sore throat.   Eyes: Negative for blurred vision.  Respiratory: Negative for cough, shortness of breath and wheezing.   Cardiovascular: Negative for chest pain, palpitations and leg swelling.  Gastrointestinal: Positive for dysphagia. Negative for abdominal pain, anorexia, bloating, blood in stool, constipation, diarrhea, hematemesis, hematochezia, jaundice, melena, nausea and vomiting.  Endocrine: Negative for polydipsia and polyphagia.  Genitourinary: Negative for dysuria, frequency, hematuria and urgency.  Musculoskeletal: Negative for arthralgias, back pain, myalgias and neck pain.  Skin: Negative for itching and rash.  Allergic/Immunologic: Negative for environmental allergies.  Neurological: Negative for dizziness, focal weakness, weakness and headaches.  Hematological: Does not bruise/bleed easily.  Psychiatric/Behavioral: Negative for suicidal ideas. The patient is not nervous/anxious.     Patient Active Problem List   Diagnosis Date Noted  . Osteoarthritis 10/02/2015  .  Prediabetes 09/09/2015  . Personal history of  colonic polyps 09/05/2015  . Hyperlipidemia 09/04/2015  . GERD (gastroesophageal reflux disease) 09/04/2015  . Dry eyes 09/04/2015    No Known Allergies  Past Surgical History:  Procedure Laterality Date  . COLONOSCOPY    . COLONOSCOPY WITH PROPOFOL N/A 06/02/2020   Procedure: COLONOSCOPY WITH PROPOFOL;  Surgeon: Lin Landsman, MD;  Location: [facility] ENDOSCOPY;  Service: Gastroenterology;  Laterality: N/A;  . ESOPHAGOGASTRODUODENOSCOPY    . EXCISION MASS NECK N/A 09/07/2016   Procedure: EXCISION MASS NECK;  Surgeon: Florene Glen, MD;  Location: [facility] ORS;  Service: General;  Laterality: N/A;  . EXCISION OF BACK LESION N/A 09/07/2016   Procedure: EXCISION OF BACK LESION;  Surgeon: Florene Glen, MD;  Location: [facility] ORS;  Service: General;  Laterality: N/A;  . MOUTH SURGERY     dental procedure    Social History   Tobacco Use  . Smoking status: Never Smoker  . Smokeless tobacco: Never Used  . Tobacco comment: smoking cessation materials not required  Vaping Use  . Vaping Use: Never used  Substance Use Topics  . Alcohol use: Yes    Comment: social  . Drug use: No     Medication list has been reviewed and updated.  Current Meds  Medication Sig  . carboxymethylcellulose (REFRESH PLUS) 0.5 % SOLN Place 1 drop into both eyes 3 (three) times daily as needed (dry eyes).   . Glucosamine-Chondroitin 1500-1200 MG/30ML LIQD Take by mouth.  . MULTIPLE VITAMIN PO Take 1 tablet by mouth daily. Also taking Airborne Vit C  . Multiple Vitamins-Minerals (AIRBORNE PO) Take by mouth.  . Omega-3 Fatty Acids (FISH OIL) 1000 MG CAPS Take 1 capsule (1,000 mg total) by mouth daily.  . sodium chloride (OCEAN) 0.65 % SOLN nasal spray Place 1 spray into both nostrils as needed for congestion.     PHQ 2/9 Scores 07/01/2020 04/07/2020 11/19/2019 04/04/2019  PHQ - 2 Score 0 0 0 0  PHQ- 9 Score 0 - 0 -    GAD 7 : Generalized Anxiety Score 07/01/2020 11/19/2019  Nervous, Anxious, on Edge 0 0    Control/stop worrying 0 0  Worry too much - different things 0 0  Trouble relaxing 0 0  Restless 0 0  Easily annoyed or irritable 0 0  Afraid - awful might happen 0 0  Total GAD 7 Score 0 0    BP Readings from Last 3 Encounters:  07/01/20 120/70  06/02/20 113/67  04/07/20 114/66    Physical Exam Vitals and nursing note reviewed.  HENT:     Head: Normocephalic.     Right Ear: Tympanic membrane, ear canal and external ear normal.     Left Ear: Tympanic membrane, ear canal and external ear normal.     Nose: Nose normal. No congestion or rhinorrhea.  Eyes:     General: No scleral icterus.       Right eye: No discharge.        Left eye: No discharge.     Conjunctiva/sclera: Conjunctivae normal.     Pupils: Pupils are equal, round, and reactive to light.  Neck:     Thyroid: No thyroid mass, thyromegaly or thyroid tenderness.     Vascular: Normal carotid pulses. No carotid bruit, hepatojugular reflux or JVD.     Trachea: No tracheal deviation.  Cardiovascular:     Rate and Rhythm: Normal rate and regular rhythm.     Heart sounds: Normal heart sounds.  No murmur heard.  No friction rub. No gallop.   Pulmonary:     Effort: No respiratory distress.     Breath sounds: Normal breath sounds. No wheezing, rhonchi or rales.  Abdominal:     General: Bowel sounds are normal.     Palpations: Abdomen is soft. There is no hepatomegaly, splenomegaly or mass.     Tenderness: There is no abdominal tenderness. There is no guarding or rebound.  Musculoskeletal:        General: No tenderness. Normal range of motion.     Cervical back: Full passive range of motion without pain, normal range of motion and neck supple.  Lymphadenopathy:     Head:     Right side of head: No submental or submandibular adenopathy.     Left side of head: No submandibular adenopathy.     Cervical: No cervical adenopathy.     Right cervical: No superficial, deep or posterior cervical adenopathy.    Left cervical:  No superficial, deep or posterior cervical adenopathy.  Skin:    General: Skin is warm.     Findings: No rash.  Neurological:     Mental Status: He is alert and oriented to person, place, and time.     Cranial Nerves: No cranial nerve deficit.     Deep Tendon Reflexes: Reflexes are normal and symmetric.     Wt Readings from Last 3 Encounters:  07/01/20 177 lb (80.3 kg)  06/02/20 169 lb (76.7 kg)  04/07/20 177 lb (80.3 kg)    BP 120/70   Pulse 72   Ht 5\' 10"  (1.778 m)   Wt 177 lb (80.3 kg)   BMI 25.40 kg/m   Assessment and Plan: 1. Pure hypercholesterolemia Chronic.  Controlled.  Stable.  Patient used to be on a statin/atorvastatin but had some issues with ED.  This was discontinued because patient had some weight loss by design and was undergoing a Mediterranean diet.  Patient returns today for lipid panel to see if this is sufficient without medication for control - Lipid Panel With LDL/HDL Ratio  2. Pharyngoesophageal dysphagia New onset.  Previous problem with dysphagia requiring endoscopy and dilation of stricture in 2011 in [place].  Presently asymptomatic but food bolus can pass at this time with some difficulty.  Referral to Dr. Marius Ditch for evaluation.  Recently did his colonoscopy.  In the meantime patient's been instructed to be careful and the size of bites there as well. - Ambulatory referral to Gastroenterology  3. Prediabetes Patient has a history of prediabetes that was noted by Dr. Vicente Masson in the past.  The last several A1c's have been in the 5.4 range.  We will recheck A1c as that this is been over a year since last evaluated. - Hemoglobin A1c  I spent 30 minutes caring for patient today reviewing previous labs and previous encounters and care everywhere/gastroenterology.  Testing procedures were ordered today.

## 2020-07-02 LAB — LIPID PANEL WITH LDL/HDL RATIO
Cholesterol, Total: 201 mg/dL — ABNORMAL HIGH (ref 100–199)
HDL: 49 mg/dL (ref >39)
LDL Chol Calc (NIH): 139 mg/dL — ABNORMAL HIGH (ref 0–99)
LDL/HDL Ratio: 2.8 ratio (ref 0.0–3.6)
Triglycerides: 71 mg/dL (ref 0–149)
VLDL Cholesterol Cal: 13 mg/dL (ref 5–40)

## 2020-07-02 LAB — HEMOGLOBIN A1C
Est. average glucose Bld gHb Est-mCnc: 111 mg/dL
Hgb A1c MFr Bld: 5.5 % (ref 4.8–5.6)

## 2020-08-20 ENCOUNTER — Encounter: Payer: Self-pay | Admitting: Gastroenterology

## 2020-08-20 ENCOUNTER — Other Ambulatory Visit: Payer: Self-pay

## 2020-08-20 ENCOUNTER — Ambulatory Visit (INDEPENDENT_AMBULATORY_CARE_PROVIDER_SITE_OTHER): Payer: Medicare Other | Admitting: Gastroenterology

## 2020-08-20 VITALS — BP 150/88 | HR 85 | Temp 97.6°F | Ht 70.0 in | Wt 184.2 lb

## 2020-08-20 DIAGNOSIS — R1310 Dysphagia, unspecified: Secondary | ICD-10-CM

## 2020-08-20 DIAGNOSIS — Z86010 Personal history of colonic polyps: Secondary | ICD-10-CM | POA: Diagnosis not present

## 2020-08-20 DIAGNOSIS — R1319 Other dysphagia: Secondary | ICD-10-CM

## 2020-08-20 NOTE — Progress Notes (Signed)
Cephas Darby, MD [address]  [place]  [place], [place] [address]  Main: [phone]  Fax: [phone]    Gastroenterology Consultation  Referring Provider:     Juline Patch, MD Primary Care Physician:  Juline Patch, MD Primary Gastroenterologist:  Dr. Cephas Darby Reason for Consultation: Dysphagia        HPI:   Jeff Mcmahon is a 75 y.o. male referred by Dr. Juline Patch, MD  for consultation & management of dysphagia.  Patient reports approximately 6 months history of intermittent dysphagia to solids including kale, meat, peanut butter.  He reports food getting stuck and sometimes associated with regurgitation.  He does report intermittent nocturnal heartburn and regurgitation as well.  He was taking ranitidine for several years until 3 years ago.  His PCP took him off the medication.  He had upper endoscopy few years ago at outside hospital for difficulty swallowing and was empirically dilated with savory to 60 [place].  He has a copy of the report which did not reveal any esophageal structural lesions.  Gastric biopsies were also normal  NSAIDs: None  Antiplts/Anticoagulants/Anti thrombotics: None  GI Procedures: Colonoscopy 06/02/2020  - One diminutive polyp in the cecum, removed with a cold biopsy forceps. Resected and retrieved. - Two 3 to 4 mm polyps in the transverse colon and in the ascending colon, removed with a cold snare. Resected and retrieved. - The distal rectum and anal verge are normal on retroflexion view. DIAGNOSIS:  A. COLON POLYP, CECUM; COLD BIOPSY:  - TUBULAR ADENOMA.  - NEGATIVE FOR HIGH-GRADE DYSPLASIA AND MALIGNANCY.   B. COLON POLYP, ASCENDING; COLD SNARE:  - TUBULAR ADENOMA.  - NEGATIVE FOR HIGH-GRADE DYSPLASIA AND MALIGNANCY.   C. COLON POLYP, TRANSVERSE; COLD SNARE:  - TUBULAR ADENOMA.  - NEGATIVE FOR HIGH-GRADE DYSPLASIA AND MALIGNANCY.   Past Medical History:  Diagnosis Date  . Arthritis   . Dysrhythmia 2007    H/O IRREGULAR HEART BEAT-SAW CARDIOLOGIST AND SAID IT WAS NOTHING TO BE CONCERNED WITH  . GERD (gastroesophageal reflux disease)    H/O  . Hyperlipidemia   . Pre-diabetes     Past Surgical History:  Procedure Laterality Date  . COLONOSCOPY    . COLONOSCOPY WITH PROPOFOL N/A 06/02/2020   Procedure: COLONOSCOPY WITH PROPOFOL;  Surgeon: Lin Landsman, MD;  Location[facility] ENDOSCOPY;  Service: Gastroenterology;  Laterality: N/A;  . ESOPHAGOGASTRODUODENOSCOPY    . EXCISION MASS NECK N/A 09/07/2016   Procedure: EXCISION MASS NECK;  Surgeon: Florene Glen, MD;  Location: [facility] ORS;  Service: General;  Laterality: N/A;  . EXCISION OF BACK LESION N/A 09/07/2016   Procedure: EXCISION OF BACK LESION;  Surgeon: Florene Glen, MD;  Location: [facility] ORS;  Service: General;  Laterality: N/A;  . MOUTH SURGERY     dental procedure    Current Outpatient Medications:  .  carboxymethylcellulose (REFRESH PLUS) 0.5 % SOLN, Place 1 drop into both eyes 3 (three) times daily as needed (dry eyes). , Disp: , Rfl:  .  Glucosamine-Chondroitin 1500-1200 MG/30ML LIQD, Take by mouth., Disp: , Rfl:  .  MULTIPLE VITAMIN PO, Take 1 tablet by mouth daily. Also taking Airborne Vit C, Disp: , Rfl:  .  Multiple Vitamins-Minerals (AIRBORNE PO), Take by mouth., Disp: , Rfl:  .  Omega-3 Fatty Acids (FISH OIL) 1000 MG CAPS, Take 1 capsule (1,000 mg total) by mouth daily., Disp: 90 capsule, Rfl: 3 .  sodium chloride (OCEAN) 0.65 % SOLN nasal  spray, Place 1 spray into both nostrils as needed for congestion. , Disp: , Rfl:    Family History  Problem Relation Age of Onset  . Lymphoma Mother 46  . Hypertension Mother   . Cancer Father        lung  . Alcohol abuse Father   . Mental illness Father   . Heart disease Brother      Social History   Tobacco Use  . Smoking status: Never Smoker  . Smokeless tobacco: Never Used  . Tobacco comment: smoking cessation materials not required  Vaping Use  . Vaping Use:  Never used  Substance Use Topics  . Alcohol use: Yes    Comment: social  . Drug use: No    Allergies as of 08/20/2020  . (No Known Allergies)    Review of Systems:    All systems reviewed and negative except where noted in HPI.   Physical Exam:  BP (!) 150/88 (BP Location: Left Arm, Patient Position: Sitting, Cuff Size: Normal)   Pulse 85   Temp 97.6 F (36.4 C) (Oral)   Ht 5\' 10"  (1.778 m)   Wt 184 lb 4 oz (83.6 kg)   BMI 26.44 kg/m  No LMP for male patient.  General:   Alert,  Well-developed, well-nourished, pleasant and cooperative in NAD Head:  Normocephalic and atraumatic. Eyes:  Sclera clear, no icterus.   Conjunctiva pink. Ears:  Normal auditory acuity. Nose:  No deformity, discharge, or lesions. Mouth:  No deformity or lesions,oropharynx pink & moist. Neck:  Supple; no masses or thyromegaly. Lungs:  Respirations even and unlabored.  Clear throughout to auscultation.   No wheezes, crackles, or rhonchi. No acute distress. Heart:  Regular rate and rhythm; no murmurs, clicks, rubs, or gallops. Abdomen:  Normal bowel sounds. Soft, non-tender and non-distended without masses, hepatosplenomegaly or hernias noted.  No guarding or rebound tenderness.   Rectal: Not performed Msk:  Symmetrical without gross deformities. Good, equal movement & strength bilaterally. Pulses:  Normal pulses noted. Extremities:  No clubbing or edema.  No cyanosis. Neurologic:  Alert and oriented x3;  grossly normal neurologically. Skin:  Intact without significant lesions or rashes. No jaundice. Psych:  Alert and cooperative. Normal mood and affect.  Imaging Studies: None  Assessment and Plan:   Jeff Mcmahon is a 75 y.o. male with no significant past medical history, history of dysphagia empirically dilated, tubular adenomas of colon  Dysphagia to solids Recommend EGD with esophageal biopsies and dilation if needed If EGD is unremarkable, may need esophageal manometry  Chronic  intermittent heartburn and regurgitation Trial of Pepcid 1-2 times daily Discussed about antireflux lifestyle, information provided  Tubular adenomas of colon Recommend surveillance colonoscopy in 05/2025   Follow up based on EGD results   Cephas Darby, MD

## 2020-08-20 NOTE — Patient Instructions (Signed)
Food Choices for Gastroesophageal Reflux Disease, Adult When you have gastroesophageal reflux disease (GERD), the foods you eat and your eating habits are very important. Choosing the right foods can help ease your discomfort. Think about working with a food expert (dietitian) to help you make good choices. What are tips for following this plan? Reading food labels  Look for foods that are low in saturated fat. Foods that may help with your symptoms include: ? Foods that have less than 5% of daily value (DV) of fat. ? Foods that have 0 grams of trans fat. Cooking  Do not fry your food.  Cook your food by baking, steaming, grilling, or broiling. These are all methods that do not need a lot of fat for cooking.  To add flavor, try to use herbs that are low in spice and acidity. Meal planning  Choose healthy foods that are low in fat, such as: ? Fruits and vegetables. ? Whole grains. ? Low-fat dairy products. ? Lean meats, fish, and poultry.  Eat small meals often instead of eating 3 large meals each day. Eat your meals slowly in a place where you are relaxed. Avoid bending over or lying down until 2-3 hours after eating.  Limit high-fat foods such as fatty meats or fried foods.  Limit your intake of fatty foods, such as oils, butter, and shortening.  Avoid the following as told by your doctor: ? Foods that cause symptoms. These may be different for different people. Keep a food diary to keep track of foods that cause symptoms. ? Alcohol. ? Drinking a lot of liquid with meals. ? Eating meals during the 2-3 hours before bed.   Lifestyle  Stay at a healthy weight. Ask your doctor what weight is healthy for you. If you need to lose weight, work with your doctor to do so safely.  Exercise for at least 30 minutes on 5 or more days each week, or as told by your doctor.  Wear loose-fitting clothes.  Do not smoke or use any products that contain nicotine or tobacco. If you need help  quitting, ask your doctor.  Sleep with the head of your bed higher than your feet. Use a wedge under the mattress or blocks under the bed frame to raise the head of the bed.  Chew sugar-free gum after meals. What foods should eat? Eat a healthy, well-balanced diet of fruits, vegetables, whole grains, low-fat dairy products, lean meats, fish, and poultry. Each person is different. Foods that may cause symptoms in one person may not cause any symptoms in another person. Work with your doctor to find foods that are safe for you. The items listed above may not be a complete list of what you can eat and drink. Contact a food expert for more options.   What foods should I avoid? Limiting some of these foods may help in managing the symptoms of GERD. Everyone is different. Talk with a food expert or your doctor to help you find the exact foods to avoid, if any. Fruits Any fruits prepared with added fat. Any fruits that cause symptoms. For some people, this may include citrus fruits, such as oranges, grapefruit, pineapple, and lemons. Vegetables Deep-fried vegetables. French fries. Any vegetables prepared with added fat. Any vegetables that cause symptoms. For some people, this may include tomatoes and tomato products, chili peppers, onions and garlic, and horseradish. Grains Pastries or quick breads with added fat. Meats and other proteins High-fat meats, such as fatty beef or pork,   hot dogs, ribs, ham, sausage, salami, and bacon. Fried meat or protein, including fried fish and fried chicken. Nuts and nut butters, in large amounts. Dairy Whole milk and chocolate milk. Sour cream. Cream. Ice cream. Cream cheese. Milkshakes. Fats and oils Butter. Margarine. Shortening. Ghee. Beverages Coffee and tea, with or without caffeine. Carbonated beverages. Sodas. Energy drinks. Fruit juice made with acidic fruits, such as orange or grapefruit. Tomato juice. Alcoholic drinks. Sweets and desserts Chocolate and  cocoa. Donuts. Seasonings and condiments Pepper. Peppermint and spearmint. Added salt. Any condiments, herbs, or seasonings that cause symptoms. For some people, this may include curry, hot sauce, or vinegar-based salad dressings. The items listed above may not be a complete list of what you should not eat and drink. Contact a food expert for more options. Questions to ask your doctor Diet and lifestyle changes are often the first steps that are taken to manage symptoms of GERD. If diet and lifestyle changes do not help, talk with your doctor about taking medicines. Where to find more information  International Foundation for Gastrointestinal Disorders: aboutgerd.org Summary  When you have GERD, food and lifestyle choices are very important in easing your symptoms.  Eat small meals often instead of 3 large meals a day. Eat your meals slowly and in a place where you are relaxed.  Avoid bending over or lying down until 2-3 hours after eating.  Limit high-fat foods such as fatty meats or fried foods. This information is not intended to replace advice given to you by your health care provider. Make sure you discuss any questions you have with your health care provider. Document Revised: 02/04/2020 Document Reviewed: 02/04/2020 Elsevier Patient Education  2021 Elsevier Inc.  

## 2020-08-29 ENCOUNTER — Other Ambulatory Visit: Payer: Self-pay

## 2020-08-29 ENCOUNTER — Other Ambulatory Visit
Admission: RE | Admit: 2020-08-29 | Discharge: 2020-08-29 | Disposition: A | Discharge status: Home / Self Care | Payer: Medicare Other | Source: Ambulatory Visit | Attending: Gastroenterology | Admitting: Gastroenterology

## 2020-08-29 DIAGNOSIS — Z01812 Encounter for preprocedural laboratory examination: Secondary | ICD-10-CM | POA: Diagnosis not present

## 2020-08-29 DIAGNOSIS — Z20822 Contact with and (suspected) exposure to covid-19: Secondary | ICD-10-CM | POA: Insufficient documentation

## 2020-08-29 LAB — SARS CORONAVIRUS 2 (TAT 6-24 HRS): SARS Coronavirus 2: NEGATIVE

## 2020-09-01 ENCOUNTER — Other Ambulatory Visit: Payer: Self-pay

## 2020-09-02 ENCOUNTER — Encounter
Admission: RE | Disposition: A | Discharge status: Home / Self Care | Payer: Self-pay | Source: Home / Self Care | Attending: Gastroenterology

## 2020-09-02 ENCOUNTER — Ambulatory Visit: Payer: Medicare Other | Admitting: Anesthesiology

## 2020-09-02 ENCOUNTER — Ambulatory Visit
Admission: RE | Admit: 2020-09-02 | Discharge: 2020-09-02 | Disposition: A | Discharge status: Home / Self Care | Payer: Medicare Other | Attending: Gastroenterology | Admitting: Gastroenterology

## 2020-09-02 ENCOUNTER — Other Ambulatory Visit: Payer: Self-pay

## 2020-09-02 DIAGNOSIS — Z801 Family history of malignant neoplasm of trachea, bronchus and lung: Secondary | ICD-10-CM | POA: Insufficient documentation

## 2020-09-02 DIAGNOSIS — Z8249 Family history of ischemic heart disease and other diseases of the circulatory system: Secondary | ICD-10-CM | POA: Diagnosis not present

## 2020-09-02 DIAGNOSIS — Z79899 Other long term (current) drug therapy: Secondary | ICD-10-CM | POA: Diagnosis not present

## 2020-09-02 DIAGNOSIS — R7303 Prediabetes: Secondary | ICD-10-CM | POA: Insufficient documentation

## 2020-09-02 DIAGNOSIS — K259 Gastric ulcer, unspecified as acute or chronic, without hemorrhage or perforation: Secondary | ICD-10-CM | POA: Diagnosis not present

## 2020-09-02 DIAGNOSIS — R1310 Dysphagia, unspecified: Secondary | ICD-10-CM

## 2020-09-02 DIAGNOSIS — Z807 Family history of other malignant neoplasms of lymphoid, hematopoietic and related tissues: Secondary | ICD-10-CM | POA: Diagnosis not present

## 2020-09-02 DIAGNOSIS — K2100 Gastro-esophageal reflux disease with esophagitis, without bleeding: Secondary | ICD-10-CM | POA: Diagnosis not present

## 2020-09-02 DIAGNOSIS — Z818 Family history of other mental and behavioral disorders: Secondary | ICD-10-CM | POA: Insufficient documentation

## 2020-09-02 DIAGNOSIS — R1314 Dysphagia, pharyngoesophageal phase: Secondary | ICD-10-CM | POA: Insufficient documentation

## 2020-09-02 DIAGNOSIS — K209 Esophagitis, unspecified without bleeding: Secondary | ICD-10-CM | POA: Diagnosis not present

## 2020-09-02 HISTORY — PX: ESOPHAGOGASTRODUODENOSCOPY (EGD) WITH PROPOFOL: SHX5813

## 2020-09-02 SURGERY — ESOPHAGOGASTRODUODENOSCOPY (EGD) WITH PROPOFOL
Anesthesia: General

## 2020-09-02 MED ORDER — PROPOFOL 500 MG/50ML IV EMUL
INTRAVENOUS | Status: DC | PRN
  Administered 2020-09-02: 180 ug/kg/min via INTRAVENOUS

## 2020-09-02 MED ORDER — PROPOFOL 500 MG/50ML IV EMUL
INTRAVENOUS | Status: AC
  Filled 2020-09-02: qty 50

## 2020-09-02 MED ORDER — LIDOCAINE HCL (PF) 2 % IJ SOLN
INTRAMUSCULAR | Status: AC
  Filled 2020-09-02: qty 5

## 2020-09-02 MED ORDER — SODIUM CHLORIDE 0.9 % IV SOLN
INTRAVENOUS | Status: DC
  Administered 2020-09-02: 20 mL/h via INTRAVENOUS

## 2020-09-02 MED ORDER — OMEPRAZOLE 40 MG PO CPDR: 40.0000 mg | DELAYED_RELEASE_CAPSULE | Freq: Two times a day (BID) | ORAL | 2 refills | Status: DC

## 2020-09-02 MED ORDER — PROPOFOL 10 MG/ML IV BOLUS
INTRAVENOUS | Status: DC | PRN
  Administered 2020-09-02: 20 mg via INTRAVENOUS
  Administered 2020-09-02: 10 mg via INTRAVENOUS
  Administered 2020-09-02: 50 mg via INTRAVENOUS
  Administered 2020-09-02: 20 mg via INTRAVENOUS

## 2020-09-02 NOTE — Op Note (Signed)
[facility] Patient Name: Jeff Mcmahon Procedure Date: 09/02/2020 8:47 AM MRN: [id number] Account #: [id number] Date of Birth: Aug 31, 1945 Admit Type: Outpatient Age: 75 Room: [facility] ENDO ROOM 3 Gender: Male Note Status: Finalized Procedure:             Upper GI endoscopy Indications:           Esophageal dysphagia Providers:             Lin Landsman MD, MD Referring MD:          Juline Patch, MD (Referring MD) Medicines:             General Anesthesia Complications:         No immediate complications. Estimated blood loss: None. Procedure:             Pre-Anesthesia Assessment:                        - Prior to the procedure, a History and Physical was                         performed, and patient medications and allergies were                         reviewed. The patient is competent. The risks and                         benefits of the procedure and the sedation options and                         risks were discussed with the patient. All questions                         were answered and informed consent was obtained.                         Patient identification and proposed procedure were                         verified by the physician, the nurse, the                         anesthesiologist, the anesthetist and the technician                         in the pre-procedure area in the procedure room in the                         endoscopy suite. Mental Status Examination: alert and                         oriented. Airway Examination: normal oropharyngeal                         airway and neck mobility. Respiratory Examination:                         clear to auscultation. CV Examination: normal.  Prophylactic Antibiotics: The patient does not require                         prophylactic antibiotics. Prior Anticoagulants: The                         patient has taken no previous anticoagulant or                          antiplatelet agents. ASA Grade Assessment: II - A                         patient with mild systemic disease. After reviewing                         the risks and benefits, the patient was deemed in                         satisfactory condition to undergo the procedure. The                         anesthesia plan was to use general anesthesia.                         Immediately prior to administration of medications,                         the patient was re-assessed for adequacy to receive                         sedatives. The heart rate, respiratory rate, oxygen                         saturations, blood pressure, adequacy of pulmonary                         ventilation, and response to care were monitored                         throughout the procedure. The physical status of the                         patient was re-assessed after the procedure.                        After obtaining informed consent, the endoscope was                         passed under direct vision. Throughout the procedure,                         the patient's blood pressure, pulse, and oxygen                         saturations were monitored continuously. The Endoscope                         was introduced through the mouth, and advanced to the  second part of duodenum. The upper GI endoscopy was                         accomplished without difficulty. The patient tolerated                         the procedure well. Findings:      The duodenal bulb and second portion of the duodenum were normal.      One non-bleeding superficial gastric ulcer with a clean ulcer base       (Forrest Class III) was found in the prepyloric region of the stomach.       The lesion was 3 mm in largest dimension.      The gastric body, incisura and gastric antrum were normal. Biopsies were       taken with a cold forceps for Helicobacter pylori testing.      The cardia and gastric  fundus were normal on retroflexion.      LA Grade B (one or more mucosal breaks greater than 5 mm, not extending       between the tops of two mucosal folds) esophagitis with no bleeding was       found in the lower third of the esophagus. Biopsies were taken with a       cold forceps for histology.      Esophagogastric landmarks were identified: the gastroesophageal junction       was found at 40 cm from the incisors. Impression:            - Normal duodenal bulb and second portion of the                         duodenum.                        - Non-bleeding gastric ulcer with a clean ulcer base                         (Forrest Class III).                        - Normal gastric body, incisura and antrum. Biopsied.                        - LA Grade B reflux esophagitis with no bleeding.                         Biopsied.                        - Esophagogastric landmarks identified. Recommendation:        - Await pathology results.                        - Discharge patient to home (with escort).                        - Resume previous diet today.                        - Use Prilosec (omeprazole) 40 mg PO BID for 3 months.                        -  Continue present medications. Procedure Code(s):     --- Professional ---                        [phone], Esophagogastroduodenoscopy, flexible,                         transoral; with biopsy, single or multiple Diagnosis Code(s):     --- Professional ---                        K25.9, Gastric ulcer, unspecified as acute or chronic,                         without hemorrhage or perforation                        K21.00, Gastro-esophageal reflux disease with                         esophagitis, without bleeding                        R13.14, Dysphagia, pharyngoesophageal phase CPT copyright 2019 American Medical Association. All rights reserved. The codes documented in this report are preliminary and upon coder review may  be revised to meet  current compliance requirements. Dr. Ulyess Mort Lin Landsman MD, MD 09/02/2020 9:22:25 AM This report has been signed electronically. Number of Addenda: 0 Note Initiated On: 09/02/2020 8:47 AM Estimated Blood Loss:  Estimated blood loss: none.      [facility]

## 2020-09-02 NOTE — Anesthesia Preprocedure Evaluation (Signed)
Anesthesia Evaluation  Patient identified by MRN, date of birth, ID band Patient awake    Reviewed: Allergy & Precautions, H&P , NPO status , reviewed documented beta blocker date and time   Airway Mallampati: II  TM Distance: >3 FB Neck ROM: limited    Dental  (+) Implants, Chipped   Pulmonary    Pulmonary exam normal        Cardiovascular Normal cardiovascular exam+ dysrhythmias   NSR on EKG   Neuro/Psych    GI/Hepatic GERD  Controlled,  Endo/Other    Renal/GU      Musculoskeletal   Abdominal   Peds  Hematology   Anesthesia Other Findings Past Medical History: No date: Arthritis 2007: Dysrhythmia     Comment:  H/O IRREGULAR HEART BEAT-SAW CARDIOLOGIST AND SAID IT               WAS NOTHING TO BE CONCERNED WITH No date: GERD (gastroesophageal reflux disease)     Comment:  H/O No date: Hyperlipidemia No date: Pre-diabetes  Past Surgical History: No date: COLONOSCOPY 06/02/2020: COLONOSCOPY WITH PROPOFOL; N/A     Comment:  Procedure: COLONOSCOPY WITH PROPOFOL;  Surgeon: Lin Landsman, MD;  Location: [facility] ENDOSCOPY;  Service:               Gastroenterology;  Laterality: N/A; No date: ESOPHAGOGASTRODUODENOSCOPY 09/07/2016: EXCISION MASS NECK; N/A     Comment:  Procedure: EXCISION MASS NECK;  Surgeon: Florene Glen, MD;  Location: [facility] ORS;  Service: General;                Laterality: N/A; 09/07/2016: EXCISION OF BACK LESION; N/A     Comment:  Procedure: EXCISION OF BACK LESION;  Surgeon: Florene Glen, MD;  Location: [facility] ORS;  Service: General;                Laterality: N/A; No date: MOUTH SURGERY     Comment:  dental procedure  BMI    Body Mass Index: 24.97 kg/m      Reproductive/Obstetrics                             Anesthesia Physical Anesthesia Plan  ASA: II  Anesthesia Plan: General   Post-op Pain Management:     Induction:   PONV Risk Score and Plan: Treatment may vary due to age or medical condition and TIVA  Airway Management Planned:   Additional Equipment:   Intra-op Plan:   Post-operative Plan:   Informed Consent: I have reviewed the patients History and Physical, chart, labs and discussed the procedure including the risks, benefits and alternatives for the proposed anesthesia with the patient or authorized representative who has indicated his/her understanding and acceptance.     Dental Advisory Given  Plan Discussed with: CRNA  Anesthesia Plan Comments:         Anesthesia Quick Evaluation

## 2020-09-02 NOTE — H&P (Signed)
Cephas Darby, MD [address]  [place]  [place], [place] [address]  Main: [phone]  Fax: [phone] Pager: [phone]  Primary Care Physician:  Juline Patch, MD Primary Gastroenterologist:  Dr. Cephas Darby  Pre-Procedure History & Physical: HPI:  Jeff Mcmahon is a 75 y.o. male is here for an endoscopy.   Past Medical History:  Diagnosis Date  . Arthritis   . Dysrhythmia 2007   H/O IRREGULAR HEART BEAT-SAW CARDIOLOGIST AND SAID IT WAS NOTHING TO BE CONCERNED WITH  . GERD (gastroesophageal reflux disease)    H/O  . Hyperlipidemia   . Pre-diabetes     Past Surgical History:  Procedure Laterality Date  . COLONOSCOPY    . COLONOSCOPY WITH PROPOFOL N/A 06/02/2020   Procedure: COLONOSCOPY WITH PROPOFOL;  Surgeon: Lin Landsman, MD;  Location: [facility] ENDOSCOPY;  Service: Gastroenterology;  Laterality: N/A;  . ESOPHAGOGASTRODUODENOSCOPY    . EXCISION MASS NECK N/A 09/07/2016   Procedure: EXCISION MASS NECK;  Surgeon: Florene Glen, MD;  Location: [facility] ORS;  Service: General;  Laterality: N/A;  . EXCISION OF BACK LESION N/A 09/07/2016   Procedure: EXCISION OF BACK LESION;  Surgeon: Florene Glen, MD;  Location: [facility] ORS;  Service: General;  Laterality: N/A;  . MOUTH SURGERY     dental procedure    Prior to Admission medications   Medication Sig Start Date End Date Taking? Authorizing Provider  carboxymethylcellulose (REFRESH PLUS) 0.5 % SOLN Place 1 drop into both eyes 3 (three) times daily as needed (dry eyes).    Yes [provider]  Glucosamine-Chondroitin 1500-1200 MG/30ML LIQD Take by mouth.   Yes [provider]  MULTIPLE VITAMIN PO Take 1 tablet by mouth daily. Also taking Airborne Vit C   Yes [provider]  Multiple Vitamins-Minerals (AIRBORNE PO) Take by mouth.   Yes [provider]  Omega-3 Fatty Acids (FISH OIL) 1000 MG CAPS Take 1 capsule (1,000 mg total) by mouth daily. 07/25/18  Yes Juline Patch, MD  sodium chloride (OCEAN) 0.65 % SOLN nasal spray Place 1 spray into both nostrils as needed for congestion.    Yes [provider]    Allergies as of 08/20/2020  . (No Known Allergies)    Family History  Problem Relation Age of Onset  . Lymphoma Mother 80  . Hypertension Mother   . Cancer Father        lung  . Alcohol abuse Father   . Mental illness Father   . Heart disease Brother     Social History   Socioeconomic History  . Marital status: Married    Spouse name: Not on file  . Number of children: 2  . Years of education: Not on file  . Highest education level: Professional school degree (e.g., MD, DDS, DVM, JD)  Occupational History  . Occupation: Retired  Tobacco Use  . Smoking status: Never Smoker  . Smokeless tobacco: Never Used  . Tobacco comment: smoking cessation materials not required  Vaping Use  . Vaping Use: Never used  Substance and Sexual Activity  . Alcohol use: Yes    Comment: social  . Drug use: No  . Sexual activity: Not Currently  Other Topics Concern  . Not on file  Social History Narrative  . Not on file   Social Determinants of Health   Financial Resource Strain: Low Risk   . Difficulty of Paying Living Expenses: Not hard at all  Food Insecurity: No Food  Insecurity  . Worried About Charity fundraiser in the Last Year: Never true  . Ran Out of Food in the Last Year: Never true  Transportation Needs: No Transportation Needs  . Lack of Transportation (Medical): No  . Lack of Transportation (Non-Medical): No  Physical Activity: Sufficiently Active  . Days of Exercise per Week: 5 days  . Minutes of Exercise per Session: 40 min  Stress: No Stress Concern Present  . Feeling of Stress : Not at all  Social Connections: Moderately Integrated  . Frequency of Communication with Friends and Family: More than three times a week  . Frequency of Social Gatherings with Friends and Family: Three times a week  . Attends Religious  Services: More than 4 times per year  . Active Member of Clubs or Organizations: No  . Attends Archivist Meetings: Never  . Marital Status: Married  Human resources officer Violence: Not At Risk  . Fear of Current or Ex-Partner: No  . Emotionally Abused: No  . Physically Abused: No  . Sexually Abused: No    Review of Systems: See HPI, otherwise negative ROS  Physical Exam: BP 130/68   Pulse 63   Temp (!) 96.2 F (35.7 C) (Temporal)   Resp 20   Ht 5\' 10"  (1.778 m)   Wt 78.9 kg   SpO2 100%   BMI 24.97 kg/m  General:   Alert,  pleasant and cooperative in NAD Head:  Normocephalic and atraumatic. Neck:  Supple; no masses or thyromegaly. Lungs:  Clear throughout to auscultation.    Heart:  Regular rate and rhythm. Abdomen:  Soft, nontender and nondistended. Normal bowel sounds, without guarding, and without rebound.   Neurologic:  Alert and  oriented x4;  grossly normal neurologically.  Impression/Plan: Jeff Mcmahon is here for an endoscopy to be performed for dysphagia  Risks, benefits, limitations, and alternatives regarding  endoscopy have been reviewed with the patient.  Questions have been answered.  All parties agreeable.   Sherri Sear, MD  09/02/2020, 8:49 AM

## 2020-09-02 NOTE — Anesthesia Procedure Notes (Signed)
Date/Time: 09/02/2020 9:05 AM Performed by: Allean Found, CRNA Pre-anesthesia Checklist: Patient identified, Emergency Drugs available, Suction available, Patient being monitored and Timeout performed Patient Re-evaluated:Patient Re-evaluated prior to induction Oxygen Delivery Method: Nasal cannula Placement Confirmation: positive ETCO2

## 2020-09-02 NOTE — Anesthesia Postprocedure Evaluation (Signed)
Anesthesia Post Note  Patient: Jeff Mcmahon  Procedure(s) Performed: ESOPHAGOGASTRODUODENOSCOPY (EGD) WITH PROPOFOL (N/A )  Patient location during evaluation: Endoscopy Anesthesia Type: General Level of consciousness: awake and alert Pain management: pain level controlled Vital Signs Assessment: post-procedure vital signs reviewed and stable Respiratory status: spontaneous breathing, nonlabored ventilation and respiratory function stable Cardiovascular status: blood pressure returned to baseline and stable Postop Assessment: no apparent nausea or vomiting Anesthetic complications: no   No complications documented.   Last Vitals:  Vitals:   09/02/20 0951 09/02/20 1001  BP: 124/78 129/64  Pulse: 60 (!) 56  Resp: 15 10  Temp:    SpO2: 100% 99%    Last Pain:  Vitals:   09/02/20 1001  TempSrc:   PainSc: 0-No pain                 Alphonsus Sias

## 2020-09-02 NOTE — Transfer of Care (Signed)
Immediate Anesthesia Transfer of Care Note  Patient: Jeff Mcmahon  Procedure(s) Performed: ESOPHAGOGASTRODUODENOSCOPY (EGD) WITH PROPOFOL (N/A )  Patient Location: PACU  Anesthesia Type:General  Level of Consciousness: sedated  Airway & Oxygen Therapy: Patient Spontanous Breathing and Patient connected to nasal cannula oxygen  Post-op Assessment: Report given to RN and Post -op Vital signs reviewed and stable  Post vital signs: Reviewed and stable  Last Vitals:  Vitals Value Taken Time  BP 108/65 09/02/20 0932  Temp 35.7 C 09/02/20 0931  Pulse 63 09/02/20 0936  Resp 14 09/02/20 0936  SpO2 99 % 09/02/20 0936  Vitals shown include unvalidated device data.  Last Pain:  Vitals:   09/02/20 0931  TempSrc: Temporal  PainSc: Asleep         Complications: No complications documented.

## 2020-09-03 ENCOUNTER — Encounter: Payer: Self-pay | Admitting: Gastroenterology

## 2020-09-03 LAB — SURGICAL PATHOLOGY

## 2020-10-06 DIAGNOSIS — H5203 Hypermetropia, bilateral: Secondary | ICD-10-CM | POA: Diagnosis not present

## 2020-10-06 DIAGNOSIS — H04122 Dry eye syndrome of left lacrimal gland: Secondary | ICD-10-CM | POA: Diagnosis not present

## 2020-10-06 DIAGNOSIS — H524 Presbyopia: Secondary | ICD-10-CM | POA: Diagnosis not present

## 2020-10-06 DIAGNOSIS — H2513 Age-related nuclear cataract, bilateral: Secondary | ICD-10-CM | POA: Diagnosis not present

## 2020-10-06 DIAGNOSIS — H52223 Regular astigmatism, bilateral: Secondary | ICD-10-CM | POA: Diagnosis not present

## 2020-11-05 DIAGNOSIS — Z23 Encounter for immunization: Secondary | ICD-10-CM | POA: Diagnosis not present

## 2020-11-10 DIAGNOSIS — Z20822 Contact with and (suspected) exposure to covid-19: Secondary | ICD-10-CM | POA: Diagnosis not present

## 2020-11-28 ENCOUNTER — Other Ambulatory Visit: Payer: Self-pay | Admitting: Gastroenterology

## 2020-11-29 ENCOUNTER — Other Ambulatory Visit: Payer: Self-pay | Admitting: Gastroenterology

## 2020-12-17 ENCOUNTER — Ambulatory Visit (INDEPENDENT_AMBULATORY_CARE_PROVIDER_SITE_OTHER): Payer: Medicare Other | Admitting: Family Medicine

## 2020-12-17 ENCOUNTER — Other Ambulatory Visit: Payer: Self-pay

## 2020-12-17 ENCOUNTER — Encounter: Payer: Self-pay | Admitting: Family Medicine

## 2020-12-17 VITALS — BP 120/70 | HR 68 | Ht 70.0 in | Wt 180.0 lb

## 2020-12-17 DIAGNOSIS — J3489 Other specified disorders of nose and nasal sinuses: Secondary | ICD-10-CM

## 2020-12-17 DIAGNOSIS — E7800 Pure hypercholesterolemia, unspecified: Secondary | ICD-10-CM

## 2020-12-17 DIAGNOSIS — Z8719 Personal history of other diseases of the digestive system: Secondary | ICD-10-CM

## 2020-12-17 MED ORDER — FAMOTIDINE 40 MG PO TABS: 40.0000 mg | ORAL_TABLET | Freq: Every day | ORAL | 1 refills | Status: DC

## 2020-12-17 NOTE — Progress Notes (Addendum)
Date:  12/17/2020   Name:  Jeff Mcmahon   DOB:  1945-12-31   MRN:  [id number]   Chief Complaint: Hyperlipidemia (Recheck cholesterol- taking otc omega 3)  Hyperlipidemia This is a chronic problem. The current episode started more than 1 year ago. The problem is controlled. Recent lipid tests were reviewed and are normal. He has no history of chronic renal disease, diabetes, hypothyroidism, liver disease, obesity or nephrotic syndrome. There are no known factors aggravating his hyperlipidemia. Pertinent negatives include no chest pain, myalgias or shortness of breath. Current antihyperlipidemic treatment includes diet change (omega 3/gone mediterranean). The current treatment provides moderate improvement of lipids. There are no compliance problems.  Risk factors for coronary artery disease include dyslipidemia.    Lab Results  Component Value Date   CREATININE 0.91 05/16/2019   BUN 11 05/16/2019   NA 138 05/16/2019   K 4.7 05/16/2019   CL 99 05/16/2019   CO2 27 05/16/2019   Lab Results  Component Value Date   CHOL 201 (H) 07/01/2020   HDL 49 07/01/2020   LDLCALC 139 (H) 07/01/2020   TRIG 71 07/01/2020   CHOLHDL 3.9 12/31/2019   Lab Results  Component Value Date   TSH 2.430 07/22/2017   Lab Results  Component Value Date   HGBA1C 5.5 07/01/2020   Lab Results  Component Value Date   WBC 5.3 07/22/2017   HGB 16.0 07/22/2017   HCT 47.7 07/22/2017   MCV 96 07/22/2017   PLT 236 07/22/2017   Lab Results  Component Value Date   ALT 22 07/25/2018   AST 20 07/25/2018   ALKPHOS 103 07/25/2018   BILITOT 0.6 07/25/2018     Review of Systems  Constitutional: Negative for chills and fever.  HENT: Negative for drooling, ear discharge, ear pain and sore throat.   Respiratory: Negative for cough, shortness of breath and wheezing.   Cardiovascular: Negative for chest pain, palpitations and leg swelling.  Gastrointestinal: Negative for abdominal pain, blood in stool,  constipation, diarrhea and nausea.  Endocrine: Negative for polydipsia.  Genitourinary: Negative for dysuria, frequency, hematuria and urgency.  Musculoskeletal: Negative for back pain, myalgias and neck pain.  Skin: Negative for rash.  Allergic/Immunologic: Negative for environmental allergies.  Neurological: Negative for dizziness and headaches.  Hematological: Does not bruise/bleed easily.  Psychiatric/Behavioral: Negative for suicidal ideas. The patient is not nervous/anxious.     Patient Active Problem List   Diagnosis Date Noted  . Dysphagia   . Osteoarthritis 10/02/2015  . Prediabetes 09/09/2015  . Personal history of colonic polyps 09/05/2015  . Hyperlipidemia 09/04/2015  . GERD (gastroesophageal reflux disease) 09/04/2015  . Dry eyes 09/04/2015    No Known Allergies  Past Surgical History:  Procedure Laterality Date  . COLONOSCOPY    . COLONOSCOPY WITH PROPOFOL N/A 06/02/2020   Procedure: COLONOSCOPY WITH PROPOFOL;  Surgeon: Lin Landsman, MD;  Location: [facility] ENDOSCOPY;  Service: Gastroenterology;  Laterality: N/A;  . ESOPHAGOGASTRODUODENOSCOPY    . ESOPHAGOGASTRODUODENOSCOPY (EGD) WITH PROPOFOL N/A 09/02/2020   Procedure: ESOPHAGOGASTRODUODENOSCOPY (EGD) WITH PROPOFOL;  Surgeon: Lin Landsman, MD;  Location: [facility];  Service: Gastroenterology;  Laterality: N/A;  . EXCISION MASS NECK N/A 09/07/2016   Procedure: EXCISION MASS NECK;  Surgeon: Florene Glen, MD;  Location: [facility] ORS;  Service: General;  Laterality: N/A;  . EXCISION OF BACK LESION N/A 09/07/2016   Procedure: EXCISION OF BACK LESION;  Surgeon: Florene Glen, MD;  Location: [facility] ORS;  Service: General;  Laterality:  N/A;  . MOUTH SURGERY     dental procedure    Social History   Tobacco Use  . Smoking status: Never Smoker  . Smokeless tobacco: Never Used  . Tobacco comment: smoking cessation materials not required  Vaping Use  . Vaping Use: Never used  Substance Use Topics  .  Alcohol use: Yes    Comment: social  . Drug use: No     Medication list has been reviewed and updated.  Current Meds  Medication Sig  . carboxymethylcellulose (REFRESH PLUS) 0.5 % SOLN Place 1 drop into both eyes 3 (three) times daily as needed (dry eyes).   . Glucosamine-Chondroitin 1500-1200 MG/30ML LIQD Take by mouth.  . MULTIPLE VITAMIN PO Take 1 tablet by mouth daily. Also taking Airborne Vit C  . Multiple Vitamins-Minerals (AIRBORNE PO) Take by mouth.  . Omega-3 Fatty Acids (FISH OIL) 1000 MG CAPS Take 1 capsule (1,000 mg total) by mouth daily.  . psyllium (METAMUCIL) 58.6 % powder Take 1 packet by mouth 3 (three) times daily.  . sodium chloride (OCEAN) 0.65 % SOLN nasal spray Place 1 spray into both nostrils as needed for congestion.     PHQ 2/9 Scores 12/17/2020 07/01/2020 04/07/2020 11/19/2019  PHQ - 2 Score 0 0 0 0  PHQ- 9 Score 0 0 - 0    GAD 7 : Generalized Anxiety Score 12/17/2020 07/01/2020 11/19/2019  Nervous, Anxious, on Edge 0 0 0  Control/stop worrying 0 0 0  Worry too much - different things 0 0 0  Trouble relaxing 0 0 0  Restless 0 0 0  Easily annoyed or irritable 0 0 0  Afraid - awful might happen 0 0 0  Total GAD 7 Score 0 0 0    BP Readings from Last 3 Encounters:  12/17/20 120/70  09/02/20 129/64  08/20/20 (!) 150/88    Physical Exam Vitals and nursing note reviewed.  HENT:     Head: Normocephalic.     Right Ear: Tympanic membrane, ear canal and external ear normal. There is no impacted cerumen.     Left Ear: Tympanic membrane, ear canal and external ear normal. There is no impacted cerumen.     Nose: Nose normal. No congestion or rhinorrhea.     Mouth/Throat:     Mouth: Mucous membranes are moist.  Eyes:     General: No scleral icterus.       Right eye: No discharge.        Left eye: No discharge.     Conjunctiva/sclera: Conjunctivae normal.     Pupils: Pupils are equal, round, and reactive to light.  Neck:     Thyroid: No thyromegaly.      Vascular: No JVD.     Trachea: No tracheal deviation.  Cardiovascular:     Rate and Rhythm: Normal rate and regular rhythm.     Heart sounds: Normal heart sounds. No murmur heard. No friction rub. No gallop.   Pulmonary:     Effort: No respiratory distress.     Breath sounds: Normal breath sounds. No wheezing, rhonchi or rales.  Abdominal:     General: Bowel sounds are normal.     Palpations: Abdomen is soft. There is no mass.     Tenderness: There is no abdominal tenderness. There is no right CVA tenderness, left CVA tenderness, guarding or rebound.  Musculoskeletal:        General: No tenderness. Normal range of motion.     Cervical back: Normal range  of motion and neck supple.  Lymphadenopathy:     Cervical: No cervical adenopathy.  Skin:    General: Skin is warm.     Capillary Refill: Capillary refill takes less than 2 seconds.     Findings: No rash.  Neurological:     Mental Status: He is alert and oriented to person, place, and time.     Cranial Nerves: No cranial nerve deficit.     Deep Tendon Reflexes: Reflexes are normal and symmetric.     Wt Readings from Last 3 Encounters:  12/17/20 180 lb (81.6 kg)  09/02/20 174 lb (78.9 kg)  08/20/20 184 lb 4 oz (83.6 kg)    BP 120/70   Pulse 68   Ht 5\' 10"  (1.778 m)   Wt 180 lb (81.6 kg)   BMI 25.83 kg/m   Assessment and Plan:  1. Pure hypercholesterolemia Chronic.  Relatively controlled.  Patient is doing a Mediterranean diet approach.  Patient also takes omega-3 primarily for his eyes but will probably getting some beneficial effect from that as well.  We will check lipid panel for current status of LDL - Lipid Panel With LDL/HDL Ratio  2. History of gastritis Chronic.  Controlled.  Stable.  The patient has completed his dosing of omeprazole twice a day for a month followed by once a day for 2 months thereafter and has not been given instructions thereafter by gastroenterology.  I suspect given the history that we will  probably should have some acid reduction in I have prescribed famotidine 40 mg for 90 days with a refill for the time being and will reassess in 6 months. - famotidine (PEPCID) 40 MG tablet; Take 1 tablet (40 mg total) by mouth daily.  Dispense: 90 tablet; Refill: 1  3. Nasal granuloma New onset.  Persistent.  Stable.  Without epistaxis.  Patient is noted when doing COVID testing there is a "bump "in the nasal passage.  On the superior surface of the nasal passage on the left there is a granulomatous looking area.  We will refer to Dr. Kathyrn Sheriff for evaluation upon his return from vacation in mid June after the 14th. - Ambulatory referral to ENT

## 2020-12-18 LAB — LIPID PANEL WITH LDL/HDL RATIO
Cholesterol, Total: 194 mg/dL (ref 100–199)
HDL: 46 mg/dL (ref >39)
LDL Chol Calc (NIH): 132 mg/dL — ABNORMAL HIGH (ref 0–99)
LDL/HDL Ratio: 2.9 ratio (ref 0.0–3.6)
Triglycerides: 89 mg/dL (ref 0–149)
VLDL Cholesterol Cal: 16 mg/dL (ref 5–40)

## 2021-01-04 ENCOUNTER — Ambulatory Visit
Admission: EM | Admit: 2021-01-04 | Discharge: 2021-01-04 | Disposition: A | Discharge status: Home / Self Care | Payer: Medicare Other | Attending: Sports Medicine | Admitting: Sports Medicine

## 2021-01-04 ENCOUNTER — Encounter: Payer: Self-pay | Admitting: Emergency Medicine

## 2021-01-04 ENCOUNTER — Other Ambulatory Visit: Payer: Self-pay

## 2021-01-04 ENCOUNTER — Ambulatory Visit (INDEPENDENT_AMBULATORY_CARE_PROVIDER_SITE_OTHER): Payer: Medicare Other

## 2021-01-04 DIAGNOSIS — W19XXXA Unspecified fall, initial encounter: Secondary | ICD-10-CM | POA: Diagnosis not present

## 2021-01-04 DIAGNOSIS — R0782 Intercostal pain: Secondary | ICD-10-CM | POA: Diagnosis not present

## 2021-01-04 DIAGNOSIS — R0781 Pleurodynia: Secondary | ICD-10-CM | POA: Diagnosis not present

## 2021-01-04 DIAGNOSIS — R0789 Other chest pain: Secondary | ICD-10-CM | POA: Diagnosis not present

## 2021-01-04 DIAGNOSIS — R079 Chest pain, unspecified: Secondary | ICD-10-CM | POA: Diagnosis not present

## 2021-01-04 NOTE — Discharge Instructions (Signed)
X-ray is within normal limits.  No fractures noted.  EKG performed today is reassuring.  You may have bruised some underlying ribs.  Continue to rest and ice the area.  Continue to take Tylenol as needed for pain.  Consider also very low-dose Aleve or Motrin if needed.  If you have any worsening pain you need to be seen again.  I would make a follow-up appointment with your PCP if you are still having pain in another week.  Go to the ED sooner if your pain is ever associated with radiation of pain down your left arm or to your neck, shortness of breath or difficulty breathing, racing heart, sweats, generalized weakness, etc.

## 2021-01-04 NOTE — ED Provider Notes (Signed)
[facility]    CSN: [id number] Arrival date & time: 01/04/21  0807      History   Chief Complaint Chief Complaint  Patient presents with  . Fall  . Rib Pain    HPI Jeff Mcmahon is a 75 y.o. male presenting for left anterior chest and left lateral rib pain x10 days.  Patient says that he was playing baseball with his grandson and fell straight onto his chest.  He says he has had pain in this area since.  It has not gotten any worse or better.  He says he does take Tylenol which improves the pain for the time being.  Patient says pain is not too bad and rates it about 2-3 out of 10.  Pain is worse whenever he reaches out with his left arm, coughs or takes a deep breath.  Patient denies ever having any bruising or swelling in this area.  He denies any associated fevers, productive cough, shortness of breath or breathing difficulty, wheezing, palpitations, dizziness or feeling faint.  Patient denies any history of cardiopulmonary disease.  He does have history of hyperlipidemia prediabetes.  Patient says he is very healthy and active.  Patient concerned about possible rib fracture.  States that since he is very active he wants to make sure everything is okay before he gets back to his normal routine.  No other concerns.  HPI  Past Medical History:  Diagnosis Date  . Arthritis   . Dysrhythmia 2007   H/O IRREGULAR HEART BEAT-SAW CARDIOLOGIST AND SAID IT WAS NOTHING TO BE CONCERNED WITH  . GERD (gastroesophageal reflux disease)    H/O  . Hyperlipidemia   . Pre-diabetes     Patient Active Problem List   Diagnosis Date Noted  . Dysphagia   . Osteoarthritis 10/02/2015  . Prediabetes 09/09/2015  . Personal history of colonic polyps 09/05/2015  . Hyperlipidemia 09/04/2015  . GERD (gastroesophageal reflux disease) 09/04/2015  . Dry eyes 09/04/2015    Past Surgical History:  Procedure Laterality Date  . COLONOSCOPY    . COLONOSCOPY WITH PROPOFOL N/A 06/02/2020    Procedure: COLONOSCOPY WITH PROPOFOL;  Surgeon: Lin Landsman, MD;  Location: [facility] ENDOSCOPY;  Service: Gastroenterology;  Laterality: N/A;  . ESOPHAGOGASTRODUODENOSCOPY    . ESOPHAGOGASTRODUODENOSCOPY (EGD) WITH PROPOFOL N/A 09/02/2020   Procedure: ESOPHAGOGASTRODUODENOSCOPY (EGD) WITH PROPOFOL;  Surgeon: Lin Landsman, MD;  Location: [place];  Service: Gastroenterology;  Laterality: N/A;  . EXCISION MASS NECK N/A 09/07/2016   Procedure: EXCISION MASS NECK;  Surgeon: Florene Glen, MD;  Location: [facility] ORS;  Service: General;  Laterality: N/A;  . EXCISION OF BACK LESION N/A 09/07/2016   Procedure: EXCISION OF BACK LESION;  Surgeon: Florene Glen, MD;  Location: [facility] ORS;  Service: General;  Laterality: N/A;  . MOUTH SURGERY     dental procedure       Home Medications    Prior to Admission medications   Medication Sig Start Date End Date Taking? Authorizing Provider  carboxymethylcellulose (REFRESH PLUS) 0.5 % SOLN Place 1 drop into both eyes 3 (three) times daily as needed (dry eyes).    Yes [provider]  famotidine (PEPCID) 40 MG tablet Take 1 tablet (40 mg total) by mouth daily. 12/17/20  Yes Juline Patch, MD  Glucosamine-Chondroitin 1500-1200 MG/30ML LIQD Take by mouth.   Yes [provider]  MULTIPLE VITAMIN PO Take 1 tablet by mouth daily. Also taking Airborne Vit C   Yes [provider]  Multiple Vitamins-Minerals (AIRBORNE PO) Take by mouth.   Yes [provider]  Omega-3 Fatty Acids (FISH OIL) 1000 MG CAPS Take 1 capsule (1,000 mg total) by mouth daily. 07/25/18  Yes Juline Patch, MD  psyllium (METAMUCIL) 58.6 % powder Take 1 packet by mouth 3 (three) times daily.   Yes [provider]  omeprazole (PRILOSEC) 40 MG capsule TAKE 1 CAPSULE BY MOUTH 2 TIMES DAILY BEFORE A MEAL. Patient not taking: No sig reported 12/01/20   Lin Landsman, MD  sodium chloride (OCEAN) 0.65 % SOLN nasal spray Place 1 spray  into both nostrils as needed for congestion.     [provider]    Family History Family History  Problem Relation Age of Onset  . Lymphoma Mother 21  . Hypertension Mother   . Cancer Father        lung  . Alcohol abuse Father   . Mental illness Father   . Heart disease Brother     Social History Social History   Tobacco Use  . Smoking status: Never Smoker  . Smokeless tobacco: Never Used  . Tobacco comment: smoking cessation materials not required  Vaping Use  . Vaping Use: Never used  Substance Use Topics  . Alcohol use: Yes    Comment: social  . Drug use: No     Allergies   Patient has no known allergies.   Review of Systems Review of Systems  Constitutional: Negative for fatigue and fever.  Respiratory: Negative for cough, shortness of breath and wheezing.   Cardiovascular: Positive for chest pain. Negative for palpitations.  Gastrointestinal: Negative for abdominal pain, nausea and vomiting.  Musculoskeletal: Negative for joint swelling.  Skin: Negative for color change, rash and wound.  Neurological: Negative for weakness.  Hematological: Does not bruise/bleed easily.     Physical Exam Triage Vital Signs ED Triage Vitals  Enc Vitals Group     BP 01/04/21 0819 135/78     Mcmahon Rate 01/04/21 0819 72     Resp 01/04/21 0819 16     Temp 01/04/21 0819 97.8 F (36.6 C)     Temp Source 01/04/21 0819 Oral     SpO2 01/04/21 0819 99 %     Weight 01/04/21 0816 179 lb 14.3 oz (81.6 kg)     Height 01/04/21 0816 5\' 10"  (1.778 m)     Head Circumference --      Peak Flow --      Pain Score 01/04/21 0816 3     Pain Loc --      Pain Edu? --      Excl. in [place]? --    No data found.  Updated Vital Signs BP 135/78 (BP Location: Left Arm)   Mcmahon 72   Temp 97.8 F (36.6 C) (Oral)   Resp 16   Ht 5\' 10"  (1.778 m)   Wt 179 lb 14.3 oz (81.6 kg)   SpO2 99%   BMI 25.81 kg/m       Physical Exam Vitals and nursing note reviewed.  Constitutional:       General: He is not in acute distress.    Appearance: Normal appearance. He is well-developed. He is not ill-appearing.  HENT:     Head: Normocephalic and atraumatic.  Eyes:     General: No scleral icterus.    Conjunctiva/sclera: Conjunctivae normal.  Cardiovascular:     Rate and Rhythm: Normal rate and regular rhythm.     Heart sounds: Normal heart  sounds.  Pulmonary:     Effort: Pulmonary effort is normal. No respiratory distress.     Breath sounds: Normal breath sounds. No wheezing, rhonchi or rales.  Chest:     Chest wall: Tenderness (TTP Left anterior and lateral 5th and 6th ribs. No swelling or ecchymosis) present.  Musculoskeletal:     Cervical back: Neck supple.  Skin:    General: Skin is warm and dry.  Neurological:     General: No focal deficit present.     Mental Status: He is alert. Mental status is at baseline.     Motor: No weakness.     Gait: Gait normal.  Psychiatric:        Mood and Affect: Mood normal.        Behavior: Behavior normal.        Thought Content: Thought content normal.      UC Treatments / Results  Labs (all labs ordered are listed, but only abnormal results are displayed) Labs Reviewed - No data to display  EKG   Radiology DG Ribs Unilateral W/Chest Left  Result Date: 01/04/2021 CLINICAL DATA:  Left-sided rib pain due to fall 10 days ago EXAM: LEFT RIBS AND CHEST - 3+ VIEW COMPARISON:  None. FINDINGS: No fracture or other bone lesions are seen involving the ribs. There is no evidence of pneumothorax or pleural effusion. Both lungs are clear. Heart size and mediastinal contours are within normal limits. IMPRESSION: Negative. Electronically Signed   By: Monte Fantasia M.D.   On: 01/04/2021 08:45    Procedures ED EKG  Date/Time: 01/04/2021 9:01 AM Performed by: Danton Clap, PA-C Authorized by: Danton Clap, PA-C   ECG reviewed by ED Physician in the absence of a cardiologist: yes   Previous ECG:    Previous ECG:   Unavailable Interpretation:    Interpretation: normal   Rate:    ECG rate:  64   ECG rate assessment: normal   Rhythm:    Rhythm: sinus rhythm   Ectopy:    Ectopy: none   QRS:    QRS axis:  Normal   QRS intervals:  Normal   QRS conduction: normal   ST segments:    ST segments:  Normal T waves:    T waves: normal   Comments:     Normal sinus rhythm, regular rate   (including critical care time)  Medications Ordered in UC Medications - No data to display  Initial Impression / Assessment and Plan / UC Course  I have reviewed the triage vital signs and the nursing notes.  Pertinent labs & imaging results that were available during my care of the patient were reviewed by me and considered in my medical decision making (see chart for details).   75 year old male presenting for left anterior lateral chest/rib pain x10 days, following a fall onto his chest.  He denies any shortness of breath or red flag signs and symptoms.  Concerned about possible fracture.  Vital signs are all normal and stable in clinic and he is overall well-appearing.  Does have tenderness of the fifth and sixth ribs anteriorly and laterally.  Chest and left rib x-ray obtained today.  X-rays are normal.  EKG performed today shows normal sinus rhythm and regular rate.  No ST elevation or depression.  No prior EKG to compare to.  Reviewed all results with patient.  Advised him of possible rib contusion or chest wall strain.  Advised supportive care at this time with RICE  and continuing Tylenol and also adding low-dose Aleve if needed.  Advised him to make a follow-up appoint with his PCP if still having the pain in a week.  Reviewed ED red flag signs and symptoms related to chest pain with patient but his pain does seem consistent with musculoskeletal pain resulting from the fall.   Final Clinical Impressions(s) / UC Diagnoses   Final diagnoses:  Chest wall pain  Fall, initial encounter     Discharge  Instructions     X-ray is within normal limits.  No fractures noted.  EKG performed today is reassuring.  You may have bruised some underlying ribs.  Continue to rest and ice the area.  Continue to take Tylenol as needed for pain.  Consider also very low-dose Aleve or Motrin if needed.  If you have any worsening pain you need to be seen again.  I would make a follow-up appointment with your PCP if you are still having pain in another week.  Go to the ED sooner if your pain is ever associated with radiation of pain down your left arm or to your neck, shortness of breath or difficulty breathing, racing heart, sweats, generalized weakness, etc.    ED Prescriptions    None     PDMP not reviewed this encounter.   Danton Clap, PA-C 01/04/21 [phone]

## 2021-01-04 NOTE — ED Triage Notes (Signed)
Patient states that 10 days ago he fell tripping over a baseball base and landed on the ground on his left side.  Patient c/o left sided rib pain.

## 2021-01-09 DIAGNOSIS — Z20822 Contact with and (suspected) exposure to covid-19: Secondary | ICD-10-CM | POA: Diagnosis not present

## 2021-01-21 DIAGNOSIS — J342 Deviated nasal septum: Secondary | ICD-10-CM | POA: Diagnosis not present

## 2021-01-21 DIAGNOSIS — J3489 Other specified disorders of nose and nasal sinuses: Secondary | ICD-10-CM | POA: Diagnosis not present

## 2021-01-29 DIAGNOSIS — J342 Deviated nasal septum: Secondary | ICD-10-CM | POA: Diagnosis not present

## 2021-02-03 DIAGNOSIS — J342 Deviated nasal septum: Secondary | ICD-10-CM | POA: Diagnosis not present

## 2021-03-31 DIAGNOSIS — L738 Other specified follicular disorders: Secondary | ICD-10-CM | POA: Diagnosis not present

## 2021-03-31 DIAGNOSIS — D225 Melanocytic nevi of trunk: Secondary | ICD-10-CM | POA: Diagnosis not present

## 2021-03-31 DIAGNOSIS — D2272 Melanocytic nevi of left lower limb, including hip: Secondary | ICD-10-CM | POA: Diagnosis not present

## 2021-03-31 DIAGNOSIS — L570 Actinic keratosis: Secondary | ICD-10-CM | POA: Diagnosis not present

## 2021-03-31 DIAGNOSIS — D2271 Melanocytic nevi of right lower limb, including hip: Secondary | ICD-10-CM | POA: Diagnosis not present

## 2021-03-31 DIAGNOSIS — D2262 Melanocytic nevi of left upper limb, including shoulder: Secondary | ICD-10-CM | POA: Diagnosis not present

## 2021-03-31 DIAGNOSIS — X32XXXA Exposure to sunlight, initial encounter: Secondary | ICD-10-CM | POA: Diagnosis not present

## 2021-03-31 DIAGNOSIS — L821 Other seborrheic keratosis: Secondary | ICD-10-CM | POA: Diagnosis not present

## 2021-03-31 DIAGNOSIS — D2261 Melanocytic nevi of right upper limb, including shoulder: Secondary | ICD-10-CM | POA: Diagnosis not present

## 2021-04-06 DIAGNOSIS — H43813 Vitreous degeneration, bilateral: Secondary | ICD-10-CM | POA: Diagnosis not present

## 2021-04-06 DIAGNOSIS — H43811 Vitreous degeneration, right eye: Secondary | ICD-10-CM | POA: Diagnosis not present

## 2021-04-06 DIAGNOSIS — H2513 Age-related nuclear cataract, bilateral: Secondary | ICD-10-CM | POA: Diagnosis not present

## 2021-04-06 DIAGNOSIS — H52223 Regular astigmatism, bilateral: Secondary | ICD-10-CM | POA: Diagnosis not present

## 2021-04-06 DIAGNOSIS — H5203 Hypermetropia, bilateral: Secondary | ICD-10-CM | POA: Diagnosis not present

## 2021-04-06 DIAGNOSIS — H33311 Horseshoe tear of retina without detachment, right eye: Secondary | ICD-10-CM | POA: Diagnosis not present

## 2021-04-06 DIAGNOSIS — H04122 Dry eye syndrome of left lacrimal gland: Secondary | ICD-10-CM | POA: Diagnosis not present

## 2021-04-06 DIAGNOSIS — H524 Presbyopia: Secondary | ICD-10-CM | POA: Diagnosis not present

## 2021-04-06 DIAGNOSIS — H35033 Hypertensive retinopathy, bilateral: Secondary | ICD-10-CM | POA: Diagnosis not present

## 2021-04-06 HISTORY — PX: RETINAL LASER PROCEDURE: SHX2339

## 2021-04-08 ENCOUNTER — Ambulatory Visit (INDEPENDENT_AMBULATORY_CARE_PROVIDER_SITE_OTHER): Payer: Medicare Other

## 2021-04-08 ENCOUNTER — Other Ambulatory Visit: Payer: Self-pay

## 2021-04-08 VITALS — BP 110/68 | HR 68 | Temp 97.7°F | Resp 16 | Ht 70.0 in | Wt 185.4 lb

## 2021-04-08 DIAGNOSIS — Z0000 Encounter for general adult medical examination without abnormal findings: Secondary | ICD-10-CM | POA: Diagnosis not present

## 2021-04-08 NOTE — Progress Notes (Signed)
Subjective:   Jeff Mcmahon is a 75 y.o. male who presents for Medicare Annual/Subsequent preventive examination.  Review of Systems     Cardiac Risk Factors include: advanced age (>40mn, >>5women);dyslipidemia;male gender     Objective:    Today's Vitals   04/08/21 1024 04/08/21 1025  BP: 110/68   Pulse: 68   Resp: 16   Temp: 97.7 F (36.5 C)   TempSrc: Oral   SpO2: 96%   Weight: 185 lb 6.4 oz (84.1 kg)   Height: '5\' 10"'$  (1.778 m)   PainSc:  1    Body mass index is 26.6 kg/m.  Advanced Directives 04/08/2021 01/04/2021 09/02/2020 06/02/2020 04/07/2020 04/04/2019 01/09/2019  Does Patient Have a Medical Advance Directive? Yes No Yes Yes Yes Yes Yes  Type of AParamedicof ARaft IslandLiving will - Living will - [place] will [place] will [place] will  Copy of [place] Chart? Yes - validated most recent copy scanned in chart (See row information) - - - Yes - validated most recent copy scanned in chart (See row information) Yes - validated most recent copy scanned in chart (See row information) -  Would patient like information on creating a medical advance directive? - - - - - - -    Current Medications (verified) Outpatient Encounter Medications as of 04/08/2021  Medication Sig   famotidine (PEPCID) 40 MG tablet Take 1 tablet (40 mg total) by mouth daily.   Misc Natural Products (GLUCOSAMINE CHONDROITIN ADV) TABS Take by mouth.   MULTIPLE VITAMIN PO Take 1 tablet by mouth daily. Also taking Airborne Vit C   Multiple Vitamins-Minerals (AIRBORNE PO) Take by mouth.   Omega-3 Fatty Acids (FISH OIL) 1000 MG CAPS Take 1 capsule (1,000 mg total) by mouth daily.   Polyethyl Glycol-Propyl Glycol (SYSTANE) 0.4-0.3 % SOLN Apply to eye.   psyllium (METAMUCIL) 58.6 % powder Take 1 packet by mouth 3 (three) times daily.   sodium chloride (OCEAN) 0.65 % SOLN nasal spray  Place 1 spray into both nostrils as needed for congestion.    triamcinolone (NASACORT ALLERGY 24HR) 55 MCG/ACT AERO nasal inhaler Place 2 sprays into the nose daily. Dr. JKathyrn Sheriff  [DISCONTINUED] carboxymethylcellulose (REFRESH PLUS) 0.5 % SOLN Place 1 drop into both eyes 3 (three) times daily as needed (dry eyes).    [DISCONTINUED] Glucosamine-Chondroitin 1500-1200 MG/30ML LIQD Take by mouth.   [DISCONTINUED] omeprazole (PRILOSEC) 40 MG capsule TAKE 1 CAPSULE BY MOUTH 2 TIMES DAILY BEFORE A MEAL. (Patient not taking: No sig reported)   No facility-administered encounter medications on file as of 04/08/2021.    Allergies (verified) Patient has no known allergies.   History: Past Medical History:  Diagnosis Date   Arthritis    Dysrhythmia 2007   H/O IRREGULAR HEART BEAT-SAW CARDIOLOGIST AND SAID IT WAS NOTHING TO BE CONCERNED WITH   GERD (gastroesophageal reflux disease)    H/O   Hyperlipidemia    Pre-diabetes    Past Surgical History:  Procedure Laterality Date   COLONOSCOPY     COLONOSCOPY WITH PROPOFOL N/A 06/02/2020   Procedure: COLONOSCOPY WITH PROPOFOL;  Surgeon: VLin Landsman MD;  Location: [facility] ENDOSCOPY;  Service: Gastroenterology;  Laterality: N/A;   ESOPHAGOGASTRODUODENOSCOPY     ESOPHAGOGASTRODUODENOSCOPY (EGD) WITH PROPOFOL N/A 09/02/2020   Procedure: ESOPHAGOGASTRODUODENOSCOPY (EGD) WITH PROPOFOL;  Surgeon: VLin Landsman MD;  [facility]  Service: Gastroenterology;  Laterality: N/A;   EXCISION MASS  NECK N/A 09/07/2016   Procedure: EXCISION MASS NECK;  Surgeon: Florene Glen, MD;  Location: [facility] ORS;  Service: General;  Laterality: N/A;   EXCISION OF BACK LESION N/A 09/07/2016   Procedure: EXCISION OF BACK LESION;  Surgeon: Florene Glen, MD;  Location: [facility] OR[facility];  Service: General;  Laterality: N/A;   MOUTH SURGERY     dental procedure   RETINAL LASER PROCEDURE Right 04/06/2021   [facility] in [place]   Family  History  Problem Relation Age of Onset   Lymphoma Mother 3   Hypertension Mother    Cancer Father        lung   Alcohol abuse Father    Mental illness Father    Heart disease Brother    Social History   Socioeconomic History   Marital status: Married    Spouse name: Not on file   Number of children: 2   Years of education: Not on file   Highest education level: Professional school degree (e.g., MD, DDS, DVM, JD)  Occupational History   Occupation: Retired  Tobacco Use   Smoking status: Never   Smokeless tobacco: Never   Tobacco comments:    smoking cessation materials not required  Vaping Use   Vaping Use: Never used  Substance and Sexual Activity   Alcohol use: Yes    Comment: social   Drug use: No   Sexual activity: Not Currently  Other Topics Concern   Not on file  Social History Narrative   Not on file   Social Determinants of Health   Financial Resource Strain: Low Risk    Difficulty of Paying Living Expenses: Not hard at all  Food Insecurity: No Food Insecurity   Worried About Charity fundraiser in the Last Year: Never true   [place] in the Last Year: Never true  Transportation Needs: No Transportation Needs   Lack of Transportation (Medical): No   Lack of Transportation (Non-Medical): No  Physical Activity: Sufficiently Active   Days of Exercise per Week: 6 days   Minutes of Exercise per Session: 30 min  Stress: No Stress Concern Present   Feeling of Stress : Not at all  Social Connections: Moderately Integrated   Frequency of Communication with Friends and Family: More than three times a week   Frequency of Social Gatherings with Friends and Family: Three times a week   Attends Religious Services: More than 4 times per year   Active Member of Clubs or Organizations: No   Attends Archivist Meetings: Never   Marital Status: Married    Tobacco Counseling Counseling given: Not Answered Tobacco comments: smoking cessation  materials not required   Clinical Intake:  Pre-visit preparation completed: Yes  Pain : 0-10 Pain Score: 1  Pain Type: Acute pain (post op retinal surgery) Pain Location: Eye Pain Orientation: Right Pain Descriptors / Indicators: Aching Pain Onset: In the past 7 days Pain Frequency: Intermittent     BMI - recorded: 26.6 Nutritional Status: BMI 25 -29 Overweight Nutritional Risks: None Diabetes: No  How often do you need to have someone help you when you read instructions, pamphlets, or other written materials from your doctor or pharmacy?: 1 - Never    Interpreter Needed?: No  Information entered by :: Clemetine Marker LPN   Activities of Daily Living In your present state of health, do you have any difficulty performing the following activities: 04/08/2021  Hearing? N  Vision? N  Difficulty  concentrating or making decisions? N  Walking or climbing stairs? N  Dressing or bathing? N  Doing errands, shopping? N  Preparing Food and eating ? N  Using the Toilet? N  In the past six months, have you accidently leaked urine? N  Do you have problems with loss of bowel control? N  Managing your Medications? N  Managing your Finances? N  Housekeeping or managing your Housekeeping? N  Some recent data might be hidden    Patient Care Team: Juline Patch, MD as PCP - General (Family Medicine) Dasher, Rayvon Char, MD as Consulting Physician (Dermatology) Margaretha Sheffield, MD (Otolaryngology) Lin Landsman, MD as Consulting Physician (Gastroenterology)  Indicate any recent Medical Services you may have received from other than [facility] providers in the past year (date may be approximate).     Assessment:   This is a routine wellness examination for [place].  Hearing/Vision screen Hearing Screening - Comments:: Pt denies hearing difficulty Vision Screening - Comments:: Annual vision screenings with [facility] Dr. Mallie Mussel  Dietary issues and exercise activities  discussed: Current Exercise Habits: Home exercise routine, Type of exercise: walking, Time (Minutes): 30, Frequency (Times/Week): 6, Weekly Exercise (Minutes/Week): 180, Intensity: Moderate, Exercise limited by: None identified   Goals Addressed             This Visit's Progress    DIET - INCREASE WATER INTAKE   On track    Recommend to drink at least 6-8 8oz glasses of water per day.       Depression Screen PHQ 2/9 Scores 04/08/2021 12/17/2020 07/01/2020 04/07/2020 11/19/2019 04/04/2019 07/25/2018  PHQ - 2 Score 0 0 0 0 0 0 0  PHQ- 9 Score - 0 0 - 0 - 0    Fall Risk Fall Risk  04/08/2021 04/07/2020 11/19/2019 04/04/2019 07/25/2018  Falls in the past year? 1 0 0 0 0  Number falls in past yr: 0 0 - 0 -  Injury with Fall? 1 0 - 0 -  Risk for fall due to : No Fall Risks No Fall Risks - - -  Risk for fall due to: Comment - - - - -  Follow up Falls prevention discussed Falls prevention discussed Falls evaluation completed Falls prevention discussed Falls evaluation completed    [place]:  Any stairs in or around the home? Yes  If so, are there any without handrails? No  Home free of loose throw rugs in walkways, pet beds, electrical cords, etc? Yes  Adequate lighting in your home to reduce risk of falls? Yes   ASSISTIVE DEVICES UTILIZED TO PREVENT FALLS:  Life alert? No  Use of a cane, walker or w/c? No  Grab bars in the bathroom? No  Shower chair or bench in shower? No  Elevated toilet seat or a handicapped toilet? Yes   TIMED UP AND GO:  Was the test performed? Yes .  Length of time to ambulate 10 feet: 5 sec.   Gait steady and fast without use of assistive device  Cognitive Function: Normal cognitive status assessed by direct observation by this Nurse Health Advisor. No abnormalities found.       6CIT Screen 04/07/2020 04/04/2019 02/22/2018 02/21/2017  What Year? 0 points 0 points 0 points 0 points  What month? 0 points 0 points 0 points  0 points  What time? 0 points 0 points 0 points 0 points  Count back from 20 0 points 0 points 0 points 0  points  Months in reverse 0 points 0 points 0 points 0 points  Repeat phrase 0 points 0 points 0 points 0 points  Total Score 0 0 0 0    Immunizations Immunization History  Administered Date(s) Administered   Influenza, High Dose Seasonal PF 04/10/2017, 04/15/2019   Influenza-Unspecified 04/09/2017, 04/09/2018, 04/22/2020   PFIZER(Purple Top)SARS-COV-2 Vaccination 08/29/2019, 09/26/2019, 05/09/2020, 11/05/2020   Pneumococcal Conjugate-13 09/04/2011   Pneumococcal Polysaccharide-23 09/04/2015   Tdap 07/23/2018   Zoster Recombinat (Shingrix) 02/01/2017, 04/10/2017   Zoster, Live 09/04/2015    TDAP status: Up to date  Flu Vaccine status: Due, Education has been provided regarding the importance of this vaccine. Advised may receive this vaccine at local pharmacy or Health Dept. Aware to provide a copy of the vaccination record if obtained from local pharmacy or Health Dept. Verbalized acceptance and understanding.  Pneumococcal vaccine status: Up to date  Covid-19 vaccine status: Completed vaccines  Qualifies for Shingles Vaccine? Yes   Zostavax completed No   Shingrix Completed?: Yes  Screening Tests Health Maintenance  Topic Date Due   INFLUENZA VACCINE  03/09/2021   COLONOSCOPY (Pts 45-22yr Insurance coverage will need to be confirmed)  06/02/2025   TETANUS/TDAP  07/23/2028   COVID-19 Vaccine  Completed   Hepatitis C Screening  Completed   PNA vac Low Risk Adult  Completed   Zoster Vaccines- Shingrix  Completed   HPV VACCINES  Aged Out    Health Maintenance  Health Maintenance Due  Topic Date Due   INFLUENZA VACCINE  03/09/2021    Colorectal cancer screening: Type of screening: Colonoscopy. Completed 06/02/20. Repeat every 5 years  Lung Cancer Screening: (Low Dose CT Chest recommended if Age 74-80years, 30 pack-year currently smoking OR have quit w/in  15years.) does not qualify.   Additional Screening:  Hepatitis C Screening: does qualify; Completed 12/30/15  Vision Screening: Recommended annual ophthalmology exams for early detection of glaucoma and other disorders of the eye. Is the patient up to date with their annual eye exam?  Yes  Who is the provider or what is the name of the office in which the patient attends annual eye exams? Dr. BMallie Mussel   Dental Screening: Recommended annual dental exams for proper oral hygiene  Community Resource Referral / Chronic Care Management: CRR required this visit?  No   CCM required this visit?  No      Plan:     I have personally reviewed and noted the following in the patient's chart:   Medical and social history Use of alcohol, tobacco or illicit drugs  Current medications and supplements including opioid prescriptions. Patient is not currently taking opioid prescriptions. Functional ability and status Nutritional status Physical activity Advanced directives List of other physicians Hospitalizations, surgeries, and ER visits in previous 12 months Vitals Screenings to include cognitive, depression, and falls Referrals and appointments  In addition, I have reviewed and discussed with patient certain preventive protocols, quality metrics, and best practice recommendations. A written personalized care plan for preventive services as well as general preventive health recommendations were provided to patient.     KClemetine Marker LPN   [id number]  Nurse Notes: none

## 2021-04-08 NOTE — Patient Instructions (Signed)
Jeff Mcmahon , Thank you for taking time to come for your Medicare Wellness Visit. I appreciate your ongoing commitment to your health goals. Please review the following plan we discussed and let me know if I can assist you in the future.   Screening recommendations/referrals: Colonoscopy: done 06/02/20. Repeat 05/2025 Recommended yearly ophthalmology/optometry visit for glaucoma screening and checkup Recommended yearly dental visit for hygiene and checkup  Vaccinations: Influenza vaccine: due Pneumococcal vaccine: done 09/04/15 Tdap vaccine: done 07/23/18 Shingles vaccine: done 02/01/17 & 04/10/17   Covid-19: done 08/29/19, 09/26/19, 05/09/20 & 11/05/20  Conditions/risks identified: Keep up the great work!  Next appointment: Follow up in one year for your annual wellness visit.   Preventive Care 74 Years and Older, Male Preventive care refers to lifestyle choices and visits with your health care provider that can promote health and wellness. What does preventive care include? A yearly physical exam. This is also called an annual well check. Dental exams once or twice a year. Routine eye exams. Ask your health care provider how often you should have your eyes checked. Personal lifestyle choices, including: Daily care of your teeth and gums. Regular physical activity. Eating a healthy diet. Avoiding tobacco and drug use. Limiting alcohol use. Practicing safe sex. Taking low doses of aspirin every day. Taking vitamin and mineral supplements as recommended by your health care provider. What happens during an annual well check? The services and screenings done by your health care provider during your annual well check will depend on your age, overall health, lifestyle risk factors, and family history of disease. Counseling  Your health care provider may ask you questions about your: Alcohol use. Tobacco use. Drug use. Emotional well-being. Home and relationship well-being. Sexual  activity. Eating habits. History of falls. Memory and ability to understand (cognition). Work and work Statistician. Screening  You may have the following tests or measurements: Height, weight, and BMI. Blood pressure. Lipid and cholesterol levels. These may be checked every 5 years, or more frequently if you are over 74 years old. Skin check. Lung cancer screening. You may have this screening every year starting at age 74 if you have a 30-pack-year history of smoking and currently smoke or have quit within the past 15 years. Fecal occult blood test (FOBT) of the stool. You may have this test every year starting at age 74. Flexible sigmoidoscopy or colonoscopy. You may have a sigmoidoscopy every 5 years or a colonoscopy every 10 years starting at age 74. Prostate cancer screening. Recommendations will vary depending on your family history and other risks. Hepatitis C blood test. Hepatitis B blood test. Sexually transmitted disease (STD) testing. Diabetes screening. This is done by checking your blood sugar (glucose) after you have not eaten for a while (fasting). You may have this done every 1-3 years. Abdominal aortic aneurysm (AAA) screening. You may need this if you are a current or former smoker. Osteoporosis. You may be screened starting at age 74 if you are at high risk. Talk with your health care provider about your test results, treatment options, and if necessary, the need for more tests. Vaccines  Your health care provider may recommend certain vaccines, such as: Influenza vaccine. This is recommended every year. Tetanus, diphtheria, and acellular pertussis (Tdap, Td) vaccine. You may need a Td booster every 10 years. Zoster vaccine. You may need this after age 74. Pneumococcal 13-valent conjugate (PCV13) vaccine. One dose is recommended after age 74. Pneumococcal polysaccharide (PPSV23) vaccine. One dose is recommended after age  65. Talk to your health care provider about which  screenings and vaccines you need and how often you need them. This information is not intended to replace advice given to you by your health care provider. Make sure you discuss any questions you have with your health care provider. Document Released: 08/22/2015 Document Revised: 04/14/2016 Document Reviewed: 05/27/2015 Elsevier Interactive Patient Education  2017 [place] Prevention in the Home Falls can cause injuries. They can happen to people of all ages. There are many things you can do to make your home safe and to help prevent falls. What can I do on the outside of my home? Regularly fix the edges of walkways and driveways and fix any cracks. Remove anything that might make you trip as you walk through a door, such as a raised step or threshold. Trim any bushes or trees on the path to your home. Use bright outdoor lighting. Clear any walking paths of anything that might make someone trip, such as rocks or tools. Regularly check to see if handrails are loose or broken. Make sure that both sides of any steps have handrails. Any raised decks and porches should have guardrails on the edges. Have any leaves, snow, or ice cleared regularly. Use sand or salt on walking paths during winter. Clean up any spills in your garage right away. This includes oil or grease spills. What can I do in the bathroom? Use night lights. Install grab bars by the toilet and in the tub and shower. Do not use towel bars as grab bars. Use non-skid mats or decals in the tub or shower. If you need to sit down in the shower, use a plastic, non-slip stool. Keep the floor dry. Clean up any water that spills on the floor as soon as it happens. Remove soap buildup in the tub or shower regularly. Attach bath mats securely with double-sided non-slip rug tape. Do not have throw rugs and other things on the floor that can make you trip. What can I do in the bedroom? Use night lights. Make sure that you have a  light by your bed that is easy to reach. Do not use any sheets or blankets that are too big for your bed. They should not hang down onto the floor. Have a firm chair that has side arms. You can use this for support while you get dressed. Do not have throw rugs and other things on the floor that can make you trip. What can I do in the kitchen? Clean up any spills right away. Avoid walking on wet floors. Keep items that you use a lot in easy-to-reach places. If you need to reach something above you, use a strong step stool that has a grab bar. Keep electrical cords out of the way. Do not use floor polish or wax that makes floors slippery. If you must use wax, use non-skid floor wax. Do not have throw rugs and other things on the floor that can make you trip. What can I do with my stairs? Do not leave any items on the stairs. Make sure that there are handrails on both sides of the stairs and use them. Fix handrails that are broken or loose. Make sure that handrails are as long as the stairways. Check any carpeting to make sure that it is firmly attached to the stairs. Fix any carpet that is loose or worn. Avoid having throw rugs at the top or bottom of the stairs. If you do have  throw rugs, attach them to the floor with carpet tape. Make sure that you have a light switch at the top of the stairs and the bottom of the stairs. If you do not have them, ask someone to add them for you. What else can I do to help prevent falls? Wear shoes that: Do not have high heels. Have rubber bottoms. Are comfortable and fit you well. Are closed at the toe. Do not wear sandals. If you use a stepladder: Make sure that it is fully opened. Do not climb a closed stepladder. Make sure that both sides of the stepladder are locked into place. Ask someone to hold it for you, if possible. Clearly mark and make sure that you can see: Any grab bars or handrails. First and last steps. Where the edge of each step  is. Use tools that help you move around (mobility aids) if they are needed. These include: Canes. Walkers. Scooters. Crutches. Turn on the lights when you go into a dark area. Replace any light bulbs as soon as they burn out. Set up your furniture so you have a clear path. Avoid moving your furniture around. If any of your floors are uneven, fix them. If there are any pets around you, be aware of where they are. Review your medicines with your doctor. Some medicines can make you feel dizzy. This can increase your chance of falling. Ask your doctor what other things that you can do to help prevent falls. This information is not intended to replace advice given to you by your health care provider. Make sure you discuss any questions you have with your health care provider. Document Released: 05/22/2009 Document Revised: 01/01/2016 Document Reviewed: 08/30/2014 Elsevier Interactive Patient Education  2017 [employer].

## 2021-04-15 DIAGNOSIS — Z23 Encounter for immunization: Secondary | ICD-10-CM | POA: Diagnosis not present

## 2021-04-20 DIAGNOSIS — H31091 Other chorioretinal scars, right eye: Secondary | ICD-10-CM | POA: Diagnosis not present

## 2021-04-20 DIAGNOSIS — H2511 Age-related nuclear cataract, right eye: Secondary | ICD-10-CM | POA: Diagnosis not present

## 2021-04-20 DIAGNOSIS — H35031 Hypertensive retinopathy, right eye: Secondary | ICD-10-CM | POA: Diagnosis not present

## 2021-04-20 DIAGNOSIS — H43813 Vitreous degeneration, bilateral: Secondary | ICD-10-CM | POA: Diagnosis not present

## 2021-05-29 ENCOUNTER — Other Ambulatory Visit: Payer: Self-pay

## 2021-05-29 ENCOUNTER — Ambulatory Visit (INDEPENDENT_AMBULATORY_CARE_PROVIDER_SITE_OTHER): Payer: Medicare Other | Admitting: Family Medicine

## 2021-05-29 VITALS — BP 138/80 | HR 68 | Ht 70.0 in | Wt 184.0 lb

## 2021-05-29 DIAGNOSIS — J0100 Acute maxillary sinusitis, unspecified: Secondary | ICD-10-CM | POA: Diagnosis not present

## 2021-05-29 DIAGNOSIS — J029 Acute pharyngitis, unspecified: Secondary | ICD-10-CM

## 2021-05-29 LAB — POCT RAPID STREP A (OFFICE): Rapid Strep A Screen: NEGATIVE

## 2021-05-29 MED ORDER — AZITHROMYCIN 250 MG PO TABS: ORAL_TABLET | ORAL | 0 refills | Status: AC

## 2021-05-29 NOTE — Progress Notes (Signed)
itis   Date:  05/29/2021   Name:  Jeff Mcmahon   DOB:  1945-09-25   MRN:  [id number]   Chief Complaint: Sore Throat  Sore Throat  This is a new problem. The current episode started in the past 7 days. The problem has been gradually worsening. Neither side of throat is experiencing more pain than the other. There has been no fever. The pain is at a severity of 5/10 (swallowing). The pain is moderate. Associated symptoms include congestion, coughing and trouble swallowing. Pertinent negatives include no abdominal pain, diarrhea, drooling, ear discharge, ear pain, headaches, hoarse voice, plugged ear sensation, neck pain, shortness of breath, swollen glands or vomiting. Associated symptoms comments: Hurts to swallow. Treatments tried: mucinex DM and Ricola. The treatment provided mild (controlling cough) relief.   Lab Results  Component Value Date   CREATININE 0.91 05/16/2019   BUN 11 05/16/2019   NA 138 05/16/2019   K 4.7 05/16/2019   CL 99 05/16/2019   CO2 27 05/16/2019   Lab Results  Component Value Date   CHOL 194 12/17/2020   HDL 46 12/17/2020   LDLCALC 132 (H) 12/17/2020   TRIG 89 12/17/2020   CHOLHDL 3.9 12/31/2019   Lab Results  Component Value Date   TSH 2.430 07/22/2017   Lab Results  Component Value Date   HGBA1C 5.5 07/01/2020   Lab Results  Component Value Date   WBC 5.3 07/22/2017   HGB 16.0 07/22/2017   HCT 47.7 07/22/2017   MCV 96 07/22/2017   PLT 236 07/22/2017   Lab Results  Component Value Date   ALT 22 07/25/2018   AST 20 07/25/2018   ALKPHOS 103 07/25/2018   BILITOT 0.6 07/25/2018     Review of Systems  HENT:  Positive for congestion and trouble swallowing. Negative for drooling, ear discharge, ear pain and hoarse voice.   Respiratory:  Positive for cough. Negative for shortness of breath and wheezing.   Cardiovascular:  Negative for chest pain, palpitations and leg swelling.  Gastrointestinal:  Negative for abdominal pain, diarrhea, nausea  and vomiting.  Musculoskeletal:  Negative for neck pain.  Neurological:  Negative for headaches.   Patient Active Problem List   Diagnosis Date Noted   Dysphagia    Osteoarthritis 10/02/2015   Prediabetes 09/09/2015   Personal history of colonic polyps 09/05/2015   Hyperlipidemia 09/04/2015   GERD (gastroesophageal reflux disease) 09/04/2015   Dry eyes 09/04/2015    No Known Allergies  Past Surgical History:  Procedure Laterality Date   COLONOSCOPY     COLONOSCOPY WITH PROPOFOL N/A 06/02/2020   Procedure: COLONOSCOPY WITH PROPOFOL;  Surgeon: Lin Landsman, MD;  Location: [facility] ENDOSCOPY;  Service: Gastroenterology;  Laterality: N/A;   ESOPHAGOGASTRODUODENOSCOPY     ESOPHAGOGASTRODUODENOSCOPY (EGD) WITH PROPOFOL N/A 09/02/2020   Procedure: ESOPHAGOGASTRODUODENOSCOPY (EGD) WITH PROPOFOL;  Surgeon: Lin Landsman, MD;  Location: [place];  Service: Gastroenterology;  Laterality: N/A;   EXCISION MASS NECK N/A 09/07/2016   Procedure: EXCISION MASS NECK;  Surgeon: Florene Glen, MD;  Location: [facility] ORS;  Service: General;  Laterality: N/A;   EXCISION OF BACK LESION N/A 09/07/2016   Procedure: EXCISION OF BACK LESION;  Surgeon: Florene Glen, MD;  Location: [facility] ORS;  Service: General;  Laterality: N/A;   MOUTH SURGERY     dental procedure   RETINAL LASER PROCEDURE Right 04/06/2021   [facility] in [place]    Social History   Tobacco Use   Smoking status: Never  Smokeless tobacco: Never   Tobacco comments:    smoking cessation materials not required  Vaping Use   Vaping Use: Never used  Substance Use Topics   Alcohol use: Yes    Comment: social   Drug use: No     Medication list has been reviewed and updated.  Current Meds  Medication Sig   famotidine (PEPCID) 40 MG tablet Take 1 tablet (40 mg total) by mouth daily.   Misc Natural Products (GLUCOSAMINE CHONDROITIN ADV) TABS Take by mouth.   MULTIPLE VITAMIN PO Take 1 tablet  by mouth daily. Also taking Airborne Vit C   Multiple Vitamins-Minerals (AIRBORNE PO) Take by mouth.   Omega-3 Fatty Acids (FISH OIL) 1000 MG CAPS Take 1 capsule (1,000 mg total) by mouth daily.   Polyethyl Glycol-Propyl Glycol (SYSTANE) 0.4-0.3 % SOLN Apply to eye.   psyllium (METAMUCIL) 58.6 % powder Take 1 packet by mouth 3 (three) times daily.   sodium chloride (OCEAN) 0.65 % SOLN nasal spray Place 1 spray into both nostrils as needed for congestion.    triamcinolone (NASACORT) 55 MCG/ACT AERO nasal inhaler Place 2 sprays into the nose daily. Dr. Kathyrn Sheriff    [facility] 2/9 Scores 04/08/2021 12/17/2020 07/01/2020 04/07/2020  PHQ - 2 Score 0 0 0 0  PHQ- 9 Score - 0 0 -    GAD 7 : Generalized Anxiety Score 12/17/2020 07/01/2020 11/19/2019  Nervous, Anxious, on Edge 0 0 0  Control/stop worrying 0 0 0  Worry too much - different things 0 0 0  Trouble relaxing 0 0 0  Restless 0 0 0  Easily annoyed or irritable 0 0 0  Afraid - awful might happen 0 0 0  Total GAD 7 Score 0 0 0    BP Readings from Last 3 Encounters:  05/29/21 138/80  04/08/21 110/68  01/04/21 135/78    Physical Exam Vitals and nursing note reviewed.  HENT:     Head: Normocephalic.     Right Ear: Tympanic membrane and ear canal normal.     Left Ear: Tympanic membrane and ear canal normal.     Nose: Congestion present. No rhinorrhea.     Mouth/Throat:     Mouth: Mucous membranes are moist. No oral lesions.     Pharynx: Posterior oropharyngeal erythema present. No pharyngeal swelling, oropharyngeal exudate or uvula swelling.  Cardiovascular:     Rate and Rhythm: Normal rate.     Heart sounds:    No friction rub.  Pulmonary:     Breath sounds: No wheezing, rhonchi or rales.  Musculoskeletal:     Cervical back: Normal range of motion.  Lymphadenopathy:     Cervical: No cervical adenopathy.     Right cervical: No superficial, deep or posterior cervical adenopathy.    Left cervical: No superficial, deep or posterior  cervical adenopathy.  Neurological:     Mental Status: He is alert.    Wt Readings from Last 3 Encounters:  05/29/21 184 lb (83.5 kg)  04/08/21 185 lb 6.4 oz (84.1 kg)  01/04/21 179 lb 14.3 oz (81.6 kg)    BP 138/80   Pulse 68   Ht 5\' 10"  (1.778 m)   Wt 184 lb (83.5 kg)   BMI 26.40 kg/m   Assessment and Plan:  1. Acute maxillary sinusitis, recurrence not specified Acute.  Persistent.  Stable.  Patient has been symptomatic for the majority of the week in addition to a sore throat he has sinus congestion with some drainage.  This  is currently being controlled symptomatically with Mucinex DM and we will add azithromycin to 50 mg 2 today followed by 1 a day for 4 days. - azithromycin (ZITHROMAX) 250 MG tablet; Take 2 tablets on day 1, then 1 tablet daily on days 2 through 5  Dispense: 6 tablet; Refill: 0  2. Pharyngitis, unspecified etiology Patient with erythematous pharynx and sore throat and anterior cervical lymphadenopathy and submandibular adenopathy.  Rapid strep test was done and was noted to be negative. - POCT rapid strep A

## 2021-06-15 DIAGNOSIS — H35033 Hypertensive retinopathy, bilateral: Secondary | ICD-10-CM | POA: Diagnosis not present

## 2021-06-15 DIAGNOSIS — H2513 Age-related nuclear cataract, bilateral: Secondary | ICD-10-CM | POA: Diagnosis not present

## 2021-06-15 DIAGNOSIS — H31091 Other chorioretinal scars, right eye: Secondary | ICD-10-CM | POA: Diagnosis not present

## 2021-06-15 DIAGNOSIS — H43813 Vitreous degeneration, bilateral: Secondary | ICD-10-CM | POA: Diagnosis not present

## 2021-06-22 ENCOUNTER — Other Ambulatory Visit: Payer: Self-pay

## 2021-06-22 ENCOUNTER — Ambulatory Visit (INDEPENDENT_AMBULATORY_CARE_PROVIDER_SITE_OTHER): Payer: Medicare Other | Admitting: Family Medicine

## 2021-06-22 ENCOUNTER — Encounter: Payer: Self-pay | Admitting: Family Medicine

## 2021-06-22 VITALS — BP 120/68 | HR 62 | Resp 16 | Ht 70.0 in | Wt 187.6 lb

## 2021-06-22 DIAGNOSIS — E7800 Pure hypercholesterolemia, unspecified: Secondary | ICD-10-CM

## 2021-06-22 DIAGNOSIS — Z8711 Personal history of peptic ulcer disease: Secondary | ICD-10-CM | POA: Diagnosis not present

## 2021-06-22 DIAGNOSIS — K2100 Gastro-esophageal reflux disease with esophagitis, without bleeding: Secondary | ICD-10-CM | POA: Diagnosis not present

## 2021-06-22 DIAGNOSIS — Z8719 Personal history of other diseases of the digestive system: Secondary | ICD-10-CM | POA: Diagnosis not present

## 2021-06-22 MED ORDER — FAMOTIDINE 40 MG PO TABS
40.0000 mg | ORAL_TABLET | Freq: Every day | ORAL | 1 refills | Status: DC
Start: 2021-06-22 — End: 2021-12-21

## 2021-06-22 NOTE — Patient Instructions (Signed)

## 2021-06-22 NOTE — Progress Notes (Signed)
Date:  06/22/2021   Name:  Jeff Mcmahon   DOB:  25-Nov-1945   MRN:  [id number]   Chief Complaint: Follow-up (6 month follow up. No new concerns today.)  Gastroesophageal Reflux He reports no abdominal pain, no chest pain, no choking, no dysphagia, no heartburn, no nausea, no sore throat or no wheezing. history of gastric ulcer. This is a chronic problem. The current episode started more than 1 year ago. The problem has been gradually improving. The symptoms are aggravated by certain foods and lying down. He has tried a histamine-2 antagonist for the symptoms. The treatment provided moderate relief. Past procedures include an EGD.   Lab Results  Component Value Date   CREATININE 0.91 05/16/2019   BUN 11 05/16/2019   NA 138 05/16/2019   K 4.7 05/16/2019   CL 99 05/16/2019   CO2 27 05/16/2019   Lab Results  Component Value Date   CHOL 194 12/17/2020   HDL 46 12/17/2020   LDLCALC 132 (H) 12/17/2020   TRIG 89 12/17/2020   CHOLHDL 3.9 12/31/2019   Lab Results  Component Value Date   TSH 2.430 07/22/2017   Lab Results  Component Value Date   HGBA1C 5.5 07/01/2020   Lab Results  Component Value Date   WBC 5.3 07/22/2017   HGB 16.0 07/22/2017   HCT 47.7 07/22/2017   MCV 96 07/22/2017   PLT 236 07/22/2017   Lab Results  Component Value Date   ALT 22 07/25/2018   AST 20 07/25/2018   ALKPHOS 103 07/25/2018   BILITOT 0.6 07/25/2018     Review of Systems  Constitutional:  Negative for chills and fever.  HENT:  Negative for drooling, ear discharge, ear pain and sore throat.   Respiratory:  Negative for choking, shortness of breath and wheezing.   Cardiovascular:  Negative for chest pain, palpitations and leg swelling.  Gastrointestinal:  Negative for abdominal pain, blood in stool, constipation, diarrhea, dysphagia, heartburn and nausea.  Endocrine: Negative for polydipsia.  Genitourinary:  Negative for dysuria, frequency, hematuria and urgency.  Musculoskeletal:   Negative for back pain, myalgias and neck pain.  Skin:  Negative for rash.  Allergic/Immunologic: Negative for environmental allergies.  Neurological:  Negative for dizziness and headaches.  Hematological:  Does not bruise/bleed easily.  Psychiatric/Behavioral:  Negative for suicidal ideas. The patient is not nervous/anxious.    Patient Active Problem List   Diagnosis Date Noted   Dysphagia    Osteoarthritis 10/02/2015   Prediabetes 09/09/2015   Personal history of colonic polyps 09/05/2015   Hyperlipidemia 09/04/2015   GERD (gastroesophageal reflux disease) 09/04/2015   Dry eyes 09/04/2015    No Known Allergies  Past Surgical History:  Procedure Laterality Date   COLONOSCOPY     COLONOSCOPY WITH PROPOFOL N/A 06/02/2020   Procedure: COLONOSCOPY WITH PROPOFOL;  Surgeon: Lin Landsman, MD;  Location: [facility] ENDOSCOPY;  Service: Gastroenterology;  Laterality: N/A;   ESOPHAGOGASTRODUODENOSCOPY     ESOPHAGOGASTRODUODENOSCOPY (EGD) WITH PROPOFOL N/A 09/02/2020   Procedure: ESOPHAGOGASTRODUODENOSCOPY (EGD) WITH PROPOFOL;  Surgeon: Lin Landsman, MD;  Location: [place];  Service: Gastroenterology;  Laterality: N/A;   EXCISION MASS NECK N/A 09/07/2016   Procedure: EXCISION MASS NECK;  Surgeon: Florene Glen, MD;  Location: [facility] ORS;  Service: General;  Laterality: N/A;   EXCISION OF BACK LESION N/A 09/07/2016   Procedure: EXCISION OF BACK LESION;  Surgeon: Florene Glen, MD;  Location: [facility] ORS;  Service: General;  Laterality: N/A;   MOUTH  SURGERY     dental procedure   RETINAL LASER PROCEDURE Right 04/06/2021   [facility] in [place] History   Tobacco Use   Smoking status: Never   Smokeless tobacco: Never   Tobacco comments:    smoking cessation materials not required  Vaping Use   Vaping Use: Never used  Substance Use Topics   Alcohol use: Yes    Comment: social   Drug use: No     Medication list has been reviewed and  updated.  No outpatient medications have been marked as taking for the 06/22/21 encounter (Office Visit) with Juline Patch, MD.    [facility] 2/9 Scores 06/22/2021 04/08/2021 12/17/2020 07/01/2020  PHQ - 2 Score 0 0 0 0  PHQ- 9 Score 0 - 0 0    GAD 7 : Generalized Anxiety Score 06/22/2021 12/17/2020 07/01/2020 11/19/2019  Nervous, Anxious, on Edge 0 0 0 0  Control/stop worrying 0 0 0 0  Worry too much - different things 0 0 0 0  Trouble relaxing 0 0 0 0  Restless 0 0 0 0  Easily annoyed or irritable 0 0 0 0  Afraid - awful might happen 0 0 0 0  Total GAD 7 Score 0 0 0 0  Anxiety Difficulty Not difficult at all - - -    BP Readings from Last 3 Encounters:  06/22/21 120/68  05/29/21 138/80  04/08/21 110/68    Physical Exam Vitals and nursing note reviewed.  HENT:     Head: Normocephalic.     Right Ear: Tympanic membrane and external ear normal.     Left Ear: Tympanic membrane and external ear normal.     Nose: Nose normal.  Eyes:     General: No scleral icterus.       Right eye: No discharge.        Left eye: No discharge.     Conjunctiva/sclera: Conjunctivae normal.     Pupils: Pupils are equal, round, and reactive to light.  Neck:     Thyroid: No thyromegaly.     Vascular: No JVD.     Trachea: No tracheal deviation.  Cardiovascular:     Rate and Rhythm: Normal rate and regular rhythm.     Heart sounds: Normal heart sounds. No murmur heard.   No friction rub. No gallop.  Pulmonary:     Effort: No respiratory distress.     Breath sounds: Normal breath sounds. No wheezing, rhonchi or rales.  Abdominal:     General: Bowel sounds are normal.     Palpations: Abdomen is soft. There is no mass.     Tenderness: There is no abdominal tenderness. There is no guarding or rebound.  Musculoskeletal:        General: No tenderness. Normal range of motion.     Cervical back: Normal range of motion and neck supple.  Lymphadenopathy:     Cervical: No cervical adenopathy.  Skin:     General: Skin is warm.     Findings: No rash.  Neurological:     Mental Status: He is alert and oriented to person, place, and time.     Cranial Nerves: No cranial nerve deficit.    Wt Readings from Last 3 Encounters:  06/22/21 187 lb 9.6 oz (85.1 kg)  05/29/21 184 lb (83.5 kg)  04/08/21 185 lb 6.4 oz (84.1 kg)    BP 120/68   Pulse 62   Resp 16   Ht 5'  10" (1.778 m)   Wt 187 lb 9.6 oz (85.1 kg)   BMI 26.92 kg/m   Assessment and Plan:  1. History of gastritis History of gastritis with gastric ulcer noted with endoscopy Dr. Marius Ditch.  We will continue with Pepcid 40 mg once a day.  May need to take an acid at night prior to lying down - famotidine (PEPCID) 40 MG tablet; Take 1 tablet (40 mg total) by mouth daily.  Dispense: 90 tablet; Refill: 1  2. Gastroesophageal reflux disease with esophagitis without hemorrhage Chronic.  Controlled.  Stable.  Continue famotidine 40 mg once a day. - famotidine (PEPCID) 40 MG tablet; Take 1 tablet (40 mg total) by mouth daily.  Dispense: 90 tablet; Refill: 1  3. History of gastric ulcer As noted above - famotidine (PEPCID) 40 MG tablet; Take 1 tablet (40 mg total) by mouth daily.  Dispense: 90 tablet; Refill: 1  4. Pure hypercholesterolemia Review of lipid panel notes mild elevation of LDL.  Patient has been given low-cholesterol guidelines and will recheck labs in 6 months.

## 2021-10-11 DIAGNOSIS — Z20822 Contact with and (suspected) exposure to covid-19: Secondary | ICD-10-CM | POA: Diagnosis not present

## 2021-11-03 DIAGNOSIS — H5203 Hypermetropia, bilateral: Secondary | ICD-10-CM | POA: Diagnosis not present

## 2021-11-03 DIAGNOSIS — H43811 Vitreous degeneration, right eye: Secondary | ICD-10-CM | POA: Diagnosis not present

## 2021-11-03 DIAGNOSIS — H04122 Dry eye syndrome of left lacrimal gland: Secondary | ICD-10-CM | POA: Diagnosis not present

## 2021-11-03 DIAGNOSIS — H2513 Age-related nuclear cataract, bilateral: Secondary | ICD-10-CM | POA: Diagnosis not present

## 2021-11-03 DIAGNOSIS — H524 Presbyopia: Secondary | ICD-10-CM | POA: Diagnosis not present

## 2021-11-03 DIAGNOSIS — H52223 Regular astigmatism, bilateral: Secondary | ICD-10-CM | POA: Diagnosis not present

## 2021-11-04 DIAGNOSIS — H2513 Age-related nuclear cataract, bilateral: Secondary | ICD-10-CM | POA: Diagnosis not present

## 2021-11-04 DIAGNOSIS — Z8669 Personal history of other diseases of the nervous system and sense organs: Secondary | ICD-10-CM | POA: Diagnosis not present

## 2021-11-27 IMAGING — CR DG RIBS W/ CHEST 3+V*L*
6 series · 6 of 6 positions shown · non-contrast
Comparison: None.

CLINICAL DATA: Left-sided rib pain due to fall 10 days ago

EXAM:
LEFT RIBS AND CHEST - 3+ VIEW

[chest pa (1 of 2)]
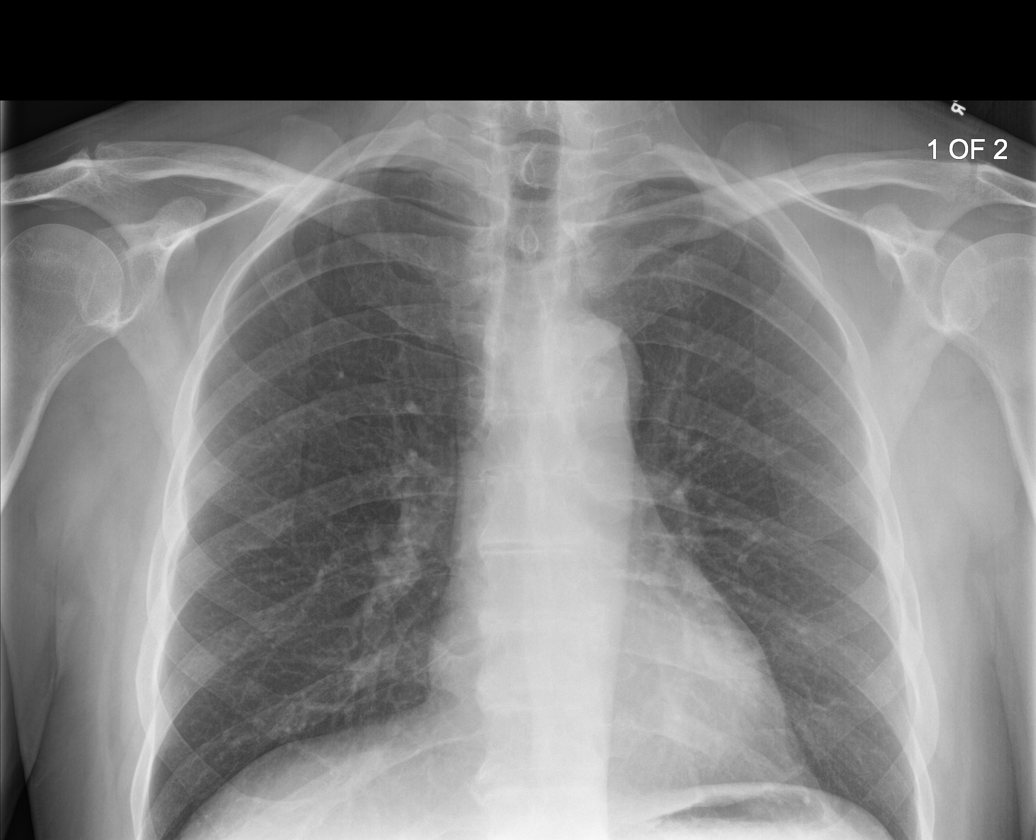

[rib pa (1 of 2)]
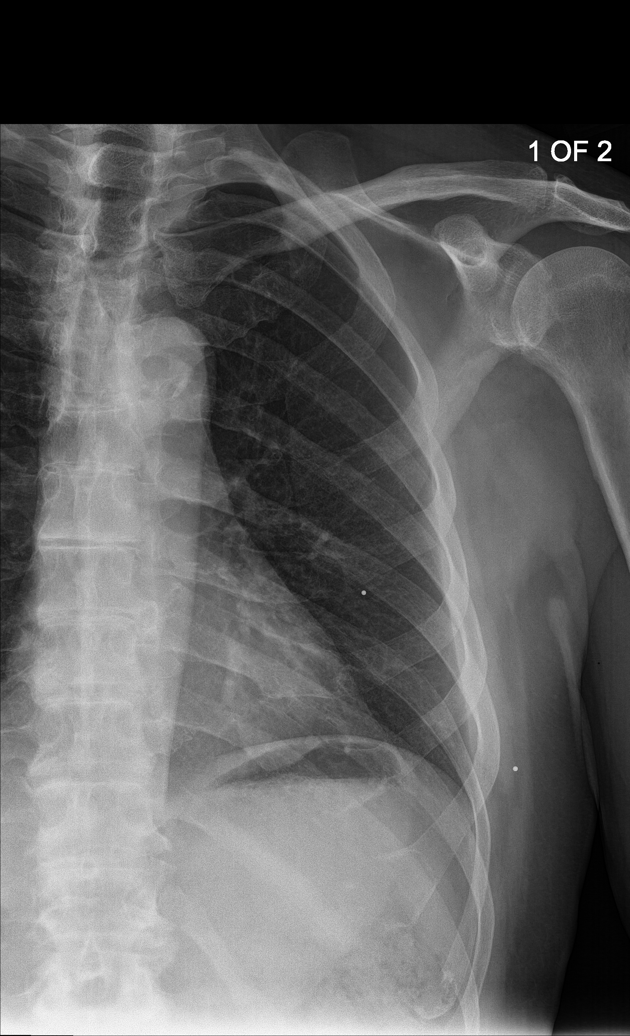

[rib pa (2 of 2)]
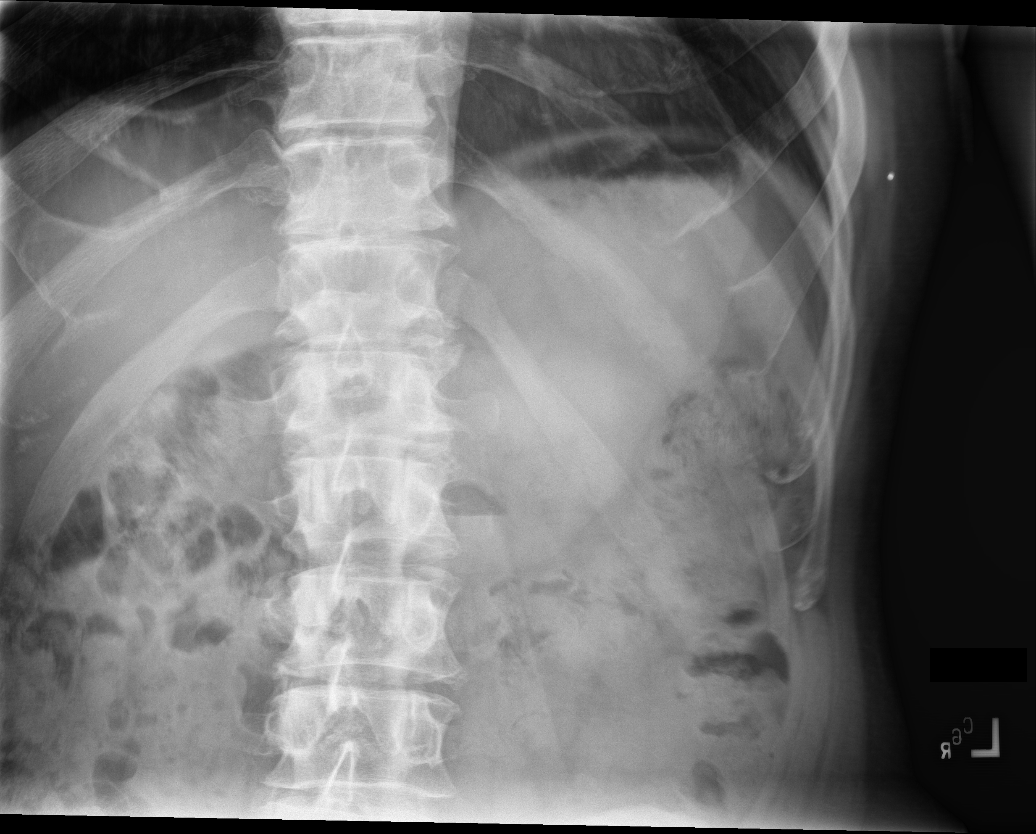

[rib obl (1 of 2)]
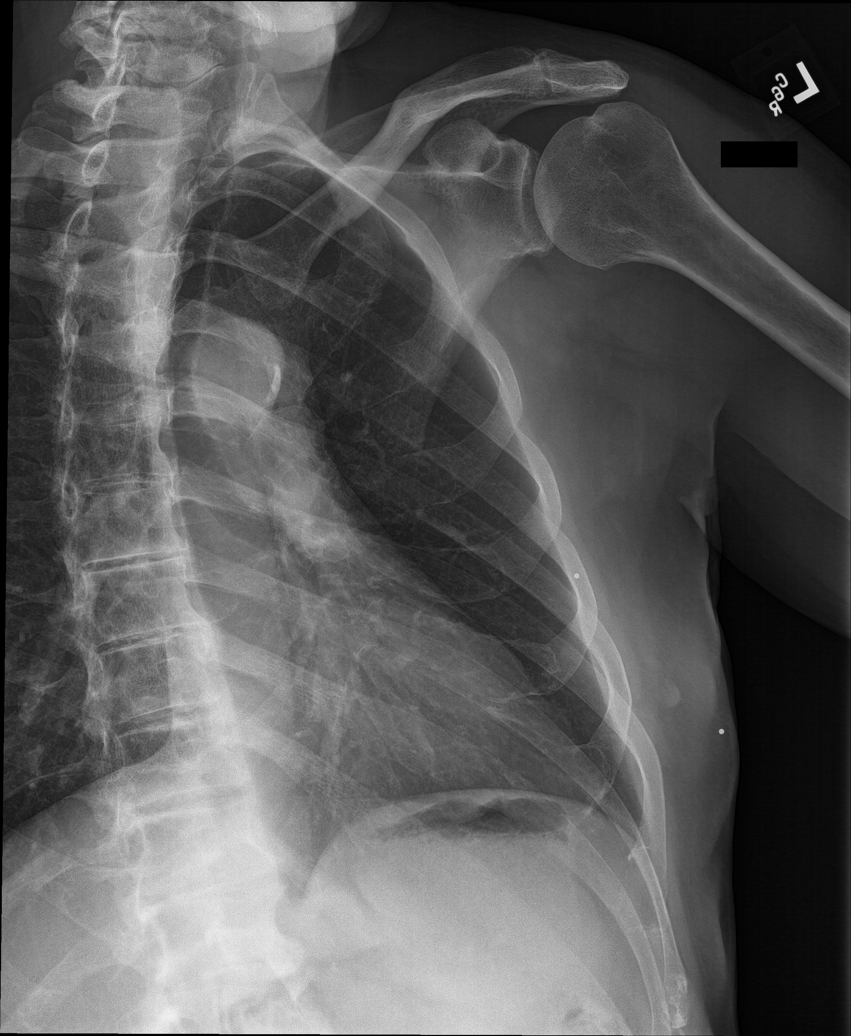

[rib obl (2 of 2)]
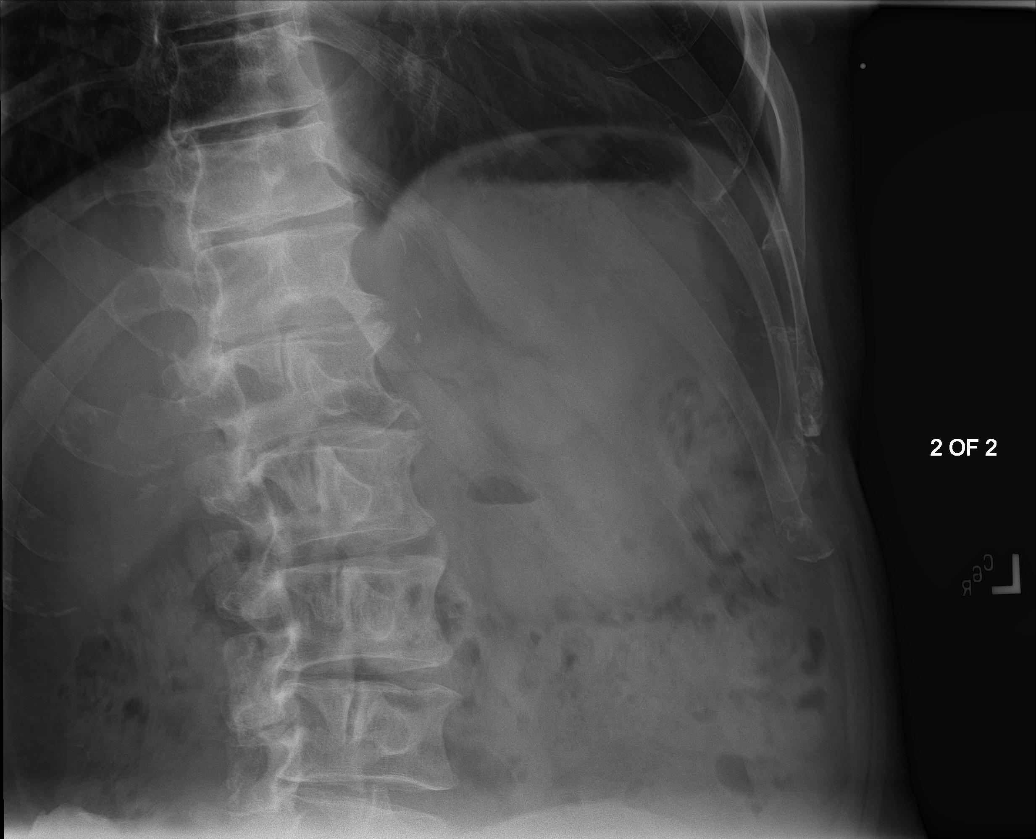

[chest pa (2 of 2)]
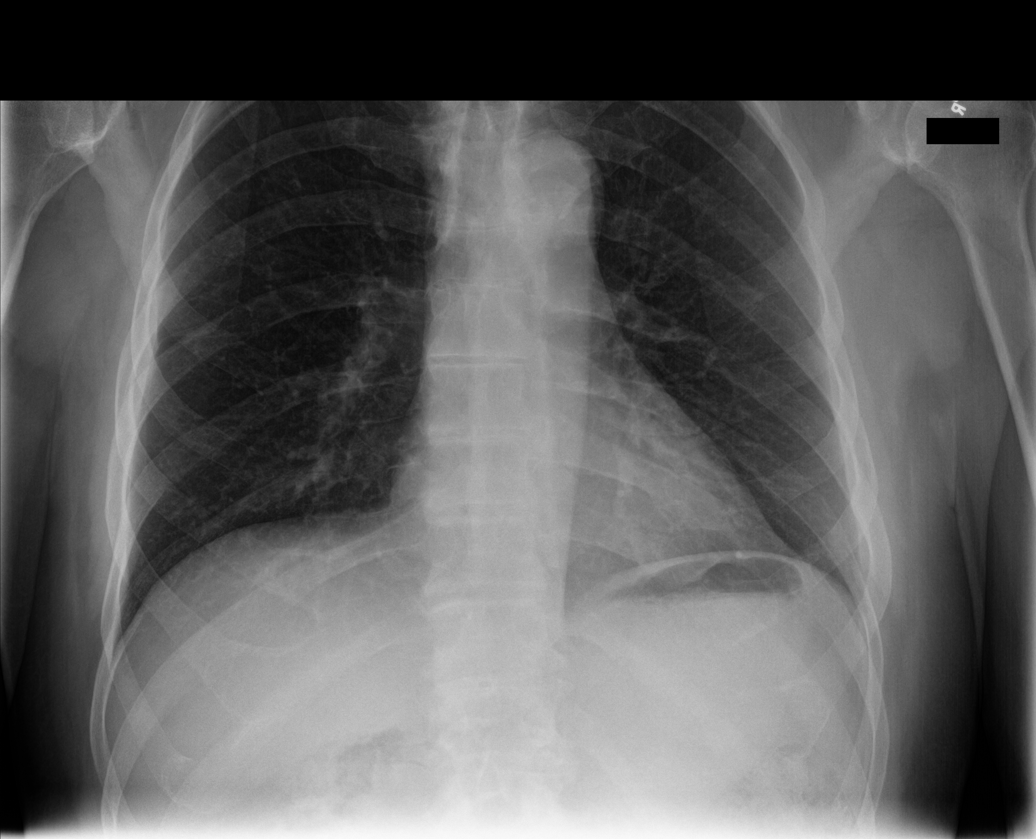

[6 of 6 positions shown; findings below may reference images not displayed]

FINDINGS: No fracture or other bone lesions are seen involving the ribs. There
is no evidence of pneumothorax or pleural effusion. Both lungs are
clear. Heart size and mediastinal contours are within normal limits.
IMPRESSION: Negative.

## 2021-12-07 DIAGNOSIS — Z20822 Contact with and (suspected) exposure to covid-19: Secondary | ICD-10-CM | POA: Diagnosis not present

## 2021-12-14 DIAGNOSIS — H31091 Other chorioretinal scars, right eye: Secondary | ICD-10-CM | POA: Diagnosis not present

## 2021-12-14 DIAGNOSIS — H35033 Hypertensive retinopathy, bilateral: Secondary | ICD-10-CM | POA: Diagnosis not present

## 2021-12-14 DIAGNOSIS — H43813 Vitreous degeneration, bilateral: Secondary | ICD-10-CM | POA: Diagnosis not present

## 2021-12-14 DIAGNOSIS — H2513 Age-related nuclear cataract, bilateral: Secondary | ICD-10-CM | POA: Diagnosis not present

## 2021-12-21 ENCOUNTER — Encounter: Payer: Self-pay | Admitting: Family Medicine

## 2021-12-21 ENCOUNTER — Ambulatory Visit (INDEPENDENT_AMBULATORY_CARE_PROVIDER_SITE_OTHER): Payer: Medicare Other | Admitting: Family Medicine

## 2021-12-21 VITALS — BP 136/82 | HR 68 | Ht 70.0 in | Wt 176.0 lb

## 2021-12-21 DIAGNOSIS — E785 Hyperlipidemia, unspecified: Secondary | ICD-10-CM

## 2021-12-21 DIAGNOSIS — Z8711 Personal history of peptic ulcer disease: Secondary | ICD-10-CM

## 2021-12-21 DIAGNOSIS — H33311 Horseshoe tear of retina without detachment, right eye: Secondary | ICD-10-CM

## 2021-12-21 DIAGNOSIS — R7303 Prediabetes: Secondary | ICD-10-CM

## 2021-12-21 DIAGNOSIS — R413 Other amnesia: Secondary | ICD-10-CM

## 2021-12-21 DIAGNOSIS — K2100 Gastro-esophageal reflux disease with esophagitis, without bleeding: Secondary | ICD-10-CM

## 2021-12-21 HISTORY — DX: Horseshoe tear of retina without detachment, right eye: H33.311

## 2021-12-21 MED ORDER — FAMOTIDINE 40 MG PO TABS: 40.0000 mg | ORAL_TABLET | Freq: Every day | ORAL | 1 refills | Status: DC

## 2021-12-21 NOTE — Progress Notes (Signed)
? ? ?Date:  12/21/2021  ? ?Name:  Jeff Mcmahon   DOB:  1945-11-16   MRN:  [id number] ? ? ?Chief Complaint: Gastroesophageal Reflux ? ?Gastroesophageal Reflux ?He reports no abdominal pain, no belching, no chest pain, no choking, no coughing, no dysphagia, no early satiety, no globus sensation, no heartburn, no hoarse voice, no nausea, no sore throat, no stridor or no wheezing. This is a chronic problem. The current episode started more than 1 year ago. Nothing aggravates the symptoms. He has tried a histamine-2 antagonist for the symptoms. The treatment provided moderate relief.  ?Neurologic Problem ?The patient's primary symptoms include memory loss. The patient's pertinent negatives include no altered mental status or loss of balance. This is a chronic problem. The problem has been gradually improving since onset. Associated symptoms include neck pain. Pertinent negatives include no abdominal pain, back pain, chest pain, dizziness, fever, headaches, nausea, palpitations or shortness of breath.  ? ?Lab Results  ?Component Value Date  ? NA 138 05/16/2019  ? K 4.7 05/16/2019  ? CO2 27 05/16/2019  ? GLUCOSE 82 05/16/2019  ? BUN 11 05/16/2019  ? CREATININE 0.91 05/16/2019  ? CALCIUM 9.6 05/16/2019  ? GFRNONAA 84 05/16/2019  ? ?Lab Results  ?Component Value Date  ? CHOL 194 12/17/2020  ? HDL 46 12/17/2020  ? LDLCALC 132 (H) 12/17/2020  ? TRIG 89 12/17/2020  ? CHOLHDL 3.9 12/31/2019  ? ?Lab Results  ?Component Value Date  ? TSH 2.430 07/22/2017  ? ?Lab Results  ?Component Value Date  ? HGBA1C 5.5 07/01/2020  ? ?Lab Results  ?Component Value Date  ? WBC 5.3 07/22/2017  ? HGB 16.0 07/22/2017  ? HCT 47.7 07/22/2017  ? MCV 96 07/22/2017  ? PLT 236 07/22/2017  ? ?Lab Results  ?Component Value Date  ? ALT 22 07/25/2018  ? AST 20 07/25/2018  ? ALKPHOS 103 07/25/2018  ? BILITOT 0.6 07/25/2018  ? ?No results found for: 25OHVITD2, [place], VD25OH  ? ?Review of Systems  ?Constitutional:  Negative for chills and fever.  ?HENT:   Negative for drooling, ear discharge, ear pain, hoarse voice and sore throat.   ?Respiratory:  Negative for cough, choking, shortness of breath and wheezing.   ?Cardiovascular:  Negative for chest pain, palpitations and leg swelling.  ?Gastrointestinal:  Negative for abdominal pain, blood in stool, constipation, diarrhea, dysphagia, heartburn and nausea.  ?Endocrine: Negative for polydipsia.  ?Genitourinary:  Negative for dysuria, frequency, hematuria and urgency.  ?Musculoskeletal:  Positive for neck pain. Negative for back pain and myalgias.  ?Skin:  Negative for rash.  ?Allergic/Immunologic: Negative for environmental allergies.  ?Neurological:  Negative for dizziness, headaches and loss of balance.  ?Hematological:  Does not bruise/bleed easily.  ?Psychiatric/Behavioral:  Positive for memory loss. Negative for suicidal ideas. The patient is not nervous/anxious.   ? ?Patient Active Problem List  ? Diagnosis Date Noted  ? Retinal tear of right eye 12/21/2021  ? Dysphagia   ? Osteoarthritis 10/02/2015  ? Prediabetes 09/09/2015  ? Personal history of colonic polyps 09/05/2015  ? Hyperlipidemia 09/04/2015  ? GERD (gastroesophageal reflux disease) 09/04/2015  ? Dry eyes 09/04/2015  ? ? ?No Known Allergies ? ?Past Surgical History:  ?Procedure Laterality Date  ? COLONOSCOPY    ? COLONOSCOPY WITH PROPOFOL N/A 06/02/2020  ? Procedure: COLONOSCOPY WITH PROPOFOL;  Surgeon: Lin Landsman, MD;  Location: [facility] ENDOSCOPY;  Service: Gastroenterology;  Laterality: N/A;  ? ESOPHAGOGASTRODUODENOSCOPY    ? ESOPHAGOGASTRODUODENOSCOPY (EGD) WITH PROPOFOL N/A 09/02/2020  ?  Procedure: ESOPHAGOGASTRODUODENOSCOPY (EGD) WITH PROPOFOL;  Surgeon: Lin Landsman, MD;  Location: [facility] ENDOSCOPY;  Service: Gastroenterology;  Laterality: N/A;  ? EXCISION MASS NECK N/A 09/07/2016  ? Procedure: EXCISION MASS NECK;  Surgeon: Florene Glen, MD;  Location: [facility] ORS;  Service: General;  Laterality: N/A;  ? EXCISION OF BACK LESION N/A  09/07/2016  ? Procedure: EXCISION OF BACK LESION;  Surgeon: Florene Glen, MD;  Location: [facility] ORS;  Service: General;  Laterality: N/A;  ? MOUTH SURGERY    ? dental procedure  ? RETINAL LASER PROCEDURE Right 04/06/2021  ? [facility] in [place]  ? ? ?Social History  ? ?Tobacco Use  ? Smoking status: Never  ? Smokeless tobacco: Never  ? Tobacco comments:  ?  smoking cessation materials not required  ?Vaping Use  ? Vaping Use: Never used  ?Substance Use Topics  ? Alcohol use: Yes  ?  Comment: social  ? Drug use: No  ? ? ? ?Medication list has been reviewed and updated. ? ?Current Meds  ?Medication Sig  ? acetaminophen (TYLENOL) 500 MG tablet Take 500 mg by mouth every 6 (six) hours as needed.  ? famotidine (PEPCID) 40 MG tablet Take 1 tablet (40 mg total) by mouth daily.  ? Misc Natural Products (GLUCOSAMINE CHONDROITIN ADV) TABS Take by mouth.  ? MULTIPLE VITAMIN PO Take 1 tablet by mouth daily. Also taking Airborne Vit C  ? Multiple Vitamins-Minerals (AIRBORNE PO) Take by mouth.  ? Omega-3 Fatty Acids (FISH OIL) 1000 MG CAPS Take 1 capsule (1,000 mg total) by mouth daily.  ? Polyethyl Glycol-Propyl Glycol (SYSTANE) 0.4-0.3 % SOLN Apply to eye.  ? psyllium (METAMUCIL) 58.6 % powder Take 1 packet by mouth 3 (three) times daily.  ? sodium chloride (OCEAN) 0.65 % SOLN nasal spray Place 1 spray into both nostrils as needed for congestion.   ? triamcinolone (NASACORT) 55 MCG/ACT AERO nasal inhaler Place 2 sprays into the nose daily. Dr. Kathyrn Sheriff  ? ? ? ?  06/22/2021  ?  8:50 AM 12/17/2020  ?  7:56 AM 07/01/2020  ?  8:00 AM 11/19/2019  ?  9:11 AM  ?GAD 7 : Generalized Anxiety Score  ?Nervous, Anxious, on Edge 0 0 0 0  ?Control/stop worrying 0 0 0 0  ?Worry too much - different things 0 0 0 0  ?Trouble relaxing 0 0 0 0  ?Restless 0 0 0 0  ?Easily annoyed or irritable 0 0 0 0  ?Afraid - awful might happen 0 0 0 0  ?Total GAD 7 Score 0 0 0 0  ?Anxiety Difficulty Not difficult at all     ? ? ? ?  06/22/2021   ?  8:50 AM  ?Depression screen PHQ 2/9  ?Decreased Interest 0  ?Down, Depressed, Hopeless 0  ?PHQ - 2 Score 0  ?Altered sleeping 0  ?Tired, decreased energy 0  ?Change in appetite 0  ?Feeling bad or failure about yourself  0  ?Trouble concentrating 0  ?Moving slowly or fidgety/restless 0  ?Suicidal thoughts 0  ?PHQ-9 Score 0  ?Difficult doing work/chores Not difficult at all  ? ? ?BP Readings from Last 3 Encounters:  ?12/21/21 136/82  ?06/22/21 120/68  ?05/29/21 138/80  ? ? ?Physical Exam ?Vitals and nursing note reviewed.  ?HENT:  ?   Head: Normocephalic.  ?   Right Ear: Tympanic membrane and external ear normal.  ?   Left Ear: Tympanic membrane and external ear normal.  ?  Nose: Nose normal.  ?Eyes:  ?   General: No scleral icterus.    ?   Right eye: No discharge.     ?   Left eye: No discharge.  ?   Conjunctiva/sclera: Conjunctivae normal.  ?   Pupils: Pupils are equal, round, and reactive to light.  ?Neck:  ?   Thyroid: No thyromegaly.  ?   Vascular: No carotid bruit or JVD.  ?   Trachea: No tracheal deviation.  ?Cardiovascular:  ?   Rate and Rhythm: Normal rate and regular rhythm.  ?   Heart sounds: Normal heart sounds, S1 normal and S2 normal. No murmur heard. ?No systolic murmur is present.  ?No diastolic murmur is present.  ?  No friction rub. No gallop. No S3 or S4 sounds.  ?Pulmonary:  ?   Effort: No respiratory distress.  ?   Breath sounds: Normal breath sounds. No wheezing, rhonchi or rales.  ?Abdominal:  ?   General: Bowel sounds are normal.  ?   Palpations: Abdomen is soft. There is no mass.  ?   Tenderness: There is no abdominal tenderness. There is no guarding or rebound.  ?Musculoskeletal:     ?   General: No tenderness. Normal range of motion.  ?   Cervical back: Normal range of motion and neck supple.  ?   Right lower leg: No edema.  ?   Left lower leg: No edema.  ?Lymphadenopathy:  ?   Cervical: No cervical adenopathy.  ?Skin: ?   General: Skin is warm.  ?   Findings: No rash.  ?Neurological:   ?   Mental Status: He is alert.  ?   Deep Tendon Reflexes: Reflexes are normal and symmetric.  ? ? ?Wt Readings from Last 3 Encounters:  ?12/21/21 176 lb (79.8 kg)  ?06/22/21 187 lb 9.6 oz (85.1 kg)  ?10/

## 2021-12-22 LAB — LIPID PANEL WITH LDL/HDL RATIO
Cholesterol, Total: 169 mg/dL (ref 100–199)
HDL: 41 mg/dL (ref >39)
LDL Chol Calc (NIH): 114 mg/dL — ABNORMAL HIGH (ref 0–99)
LDL/HDL Ratio: 2.8 ratio (ref 0.0–3.6)
Triglycerides: 74 mg/dL (ref 0–149)
VLDL Cholesterol Cal: 14 mg/dL (ref 5–40)

## 2021-12-22 LAB — HEMOGLOBIN A1C
Est. average glucose Bld gHb Est-mCnc: 108 mg/dL
Hgb A1c MFr Bld: 5.4 % (ref 4.8–5.6)

## 2021-12-22 LAB — RENAL FUNCTION PANEL
Albumin: 4.5 g/dL (ref 3.7–4.7)
BUN/Creatinine Ratio: 18 (ref 10–24)
BUN: 14 mg/dL (ref 8–27)
CO2: 27 mmol/L (ref 20–29)
Calcium: 9.3 mg/dL (ref 8.6–10.2)
Chloride: 99 mmol/L (ref 96–106)
Creatinine, Ser: 0.77 mg/dL (ref 0.76–1.27)
Glucose: 89 mg/dL (ref 70–99)
Phosphorus: 3.9 mg/dL (ref 2.8–4.1)
Potassium: 4.7 mmol/L (ref 3.5–5.2)
Sodium: 138 mmol/L (ref 134–144)
eGFR: 94 mL/min/{1.73_m2} (ref >59)

## 2022-01-21 DIAGNOSIS — G3184 Mild cognitive impairment, so stated: Secondary | ICD-10-CM | POA: Diagnosis not present

## 2022-03-23 ENCOUNTER — Ambulatory Visit (INDEPENDENT_AMBULATORY_CARE_PROVIDER_SITE_OTHER): Payer: Medicare Other | Admitting: Family Medicine

## 2022-03-23 ENCOUNTER — Encounter: Payer: Self-pay | Admitting: Family Medicine

## 2022-03-23 ENCOUNTER — Ambulatory Visit: Payer: Self-pay | Admitting: *Deleted

## 2022-03-23 VITALS — Temp 98.2°F | Ht 70.0 in | Wt 180.0 lb

## 2022-03-23 DIAGNOSIS — U071 COVID-19: Secondary | ICD-10-CM | POA: Diagnosis not present

## 2022-03-23 MED ORDER — MOLNUPIRAVIR EUA 200MG CAPSULE: 4.0000 | ORAL_CAPSULE | Freq: Two times a day (BID) | ORAL | 0 refills | Status: AC

## 2022-03-23 NOTE — Patient Instructions (Signed)
Quarantine and Isolation Quarantine and isolation refer to local and travel restrictions to protect the public and travelers from contagious diseases that constitute a public health threat. Contagious diseases are diseases that can spread from one person to another. Quarantine and isolation help to protect the public by preventing exposure to people who have or may have a contagious disease. Isolation separates people who are sick with a contagious disease from people who are not sick. Quarantine separates and restricts the movement of people who were exposed to a contagious disease to see if they become sick. You may be put in quarantine or isolation if you have been exposed to or diagnosed with any of the following diseases: Severe acute respiratory syndromes, such as COVID-19. Cholera. Diphtheria. Tuberculosis. Plague. Smallpox. Yellow fever. Viral hemorrhagic fevers, such as [place], Ebola, and [place]. When to quarantine or isolate Follow these rules, whether you have been vaccinated or not: Stay home and isolate from others when you are sick with a contagious disease. Isolate when you test positive for a contagious disease, even if you do not have symptoms. Isolate if you are sick and suspect that you may have a contagious disease. If you suspect that you have a contagious disease, get tested. If your test results are negative, you can end your isolation. If your test results are positive, follow the full isolation recommendations as told by your health care provider or local health authorities. Quarantine and stay away from others when you have been in close contact with someone who has tested positive for a contagious disease. Close contact is defined as being less than 6 ft (1.8 m) away from an infected person for a total of 15 minutes or more over a 24-hour period. Do not go to places where you are unable to wear a mask, such as restaurants and some gyms. Stay home and separate  from others as much as possible. Avoid being around people who may get very sick from the contagious disease that you have. Use a separate bathroom, if possible. Do not travel. For travel guidance, visit the CDC's travel webpage at wwwnc.cdc.gov/travel/ Follow these instructions at home: Medicines  Take over-the-counter and prescription medicines as told by your health care provider. Finish all antibiotic medicine even when you start to feel better. Stay up to date with all your vaccines. Get scheduled vaccines and boosters as recommended by your health care provider. Lifestyle Wear a high-quality mask if you must be around others at home and in public, if recommended. Improve air flow (ventilation) at home to help prevent the disease from spreading to other people, if possible. Do not share personal household items, like cups, towels, and utensils. Practice everyday hygiene and cleaning. General instructions Talk to your health care provider if you have a weakened body defense system (immune system). People with a weakened immune system may have a reduced immune response to vaccines. You may need to follow current prevention measures, including wearing a well-fitting mask, avoiding crowds, and avoiding poorly ventilated indoor places. Monitor symptoms and follow health care provider instructions, which may include resting, drinking fluids, and taking medicines. Follow specific isolation and quarantine recommendations if you are in places that can lead to disease outbreaks, such as correctional and detention facilities, homeless shelters, and cruise ships. Return to your normal activities as told by your health care provider. Ask your health care provider what activities are safe for you. Keep all follow-up visits. This is important. Where to find more information CDC: www.cdc.gov/quarantine/index.html Contact   a health care provider if: You have a fever. You have signs and symptoms that  return or get worse after isolation. Get help right away if: You have difficulty breathing. You have chest pain. These symptoms may be an emergency. Get help right away. Call 911. Do not wait to see if the symptoms will go away. Do not drive yourself to the hospital. Summary Isolation and quarantine help protect the public by preventing exposure to people who have or may have a contagious disease. Isolate when you are sick or when you test positive, even if you do not have symptoms. Quarantine and stay away from others when you have been in close contact with someone who has tested positive for a contagious disease. This information is not intended to replace advice given to you by your health care provider. Make sure you discuss any questions you have with your health care provider. Document Revised: 08/06/2021 Document Reviewed: 07/16/2021 Elsevier Patient Education  [place] Under Monitoring Name: Jeff Mcmahon  Location: [place] [id number]   Infection Prevention Recommendations for Individuals Confirmed to have, or Being Evaluated for, 2019 Novel Coronavirus (COVID-19) Infection Who Receive Care at Home  Individuals who are confirmed to have, or are being evaluated for, COVID-19 should follow the prevention steps below until a healthcare provider or local or state health department says they can return to normal activities.  Stay home except to get medical care You should restrict activities outside your home, except for getting medical care. Do not go to work, school, or public areas, and do not use public transportation or taxis.  Call ahead before visiting your doctor Before your medical appointment, call the healthcare provider and tell them that you have, or are being evaluated for, COVID-19 infection. This will help the healthcare provider's office take steps to keep other people from getting infected. Ask your healthcare provider  to call the local or state health department.  Monitor your symptoms Seek prompt medical attention if your illness is worsening (e.g., difficulty breathing). Before going to your medical appointment, call the healthcare provider and tell them that you have, or are being evaluated for, COVID-19 infection. Ask your healthcare provider to call the local or state health department.  Wear a facemask You should wear a facemask that covers your nose and mouth when you are in the same room with other people and when you visit a healthcare provider. People who live with or visit you should also wear a facemask while they are in the same room with you.  Separate yourself from other people in your home As much as possible, you should stay in a different room from other people in your home. Also, you should use a separate bathroom, if available.  Avoid sharing household items You should not share dishes, drinking glasses, cups, eating utensils, towels, bedding, or other items with other people in your home. After using these items, you should wash them thoroughly with soap and water.  Cover your coughs and sneezes Cover your mouth and nose with a tissue when you cough or sneeze, or you can cough or sneeze into your sleeve. Throw used tissues in a lined trash can, and immediately wash your hands with soap and water for at least 20 seconds or use an alcohol-based hand rub.  Wash your [facility] your hands often and thoroughly with soap and water for at least 20 seconds. You can use an alcohol-based hand sanitizer  if soap and water are not available and if your hands are not visibly dirty. Avoid touching your eyes, nose, and mouth with unwashed hands.   Prevention Steps for Caregivers and Household Members of Individuals Confirmed to have, or Being Evaluated for, COVID-19 Infection Being Cared for in the Home  If you live with, or provide care at home for, a person confirmed to have, or being  evaluated for, COVID-19 infection please follow these guidelines to prevent infection:  Follow healthcare provider's instructions Make sure that you understand and can help the patient follow any healthcare provider instructions for all care.  Provide for the patient's basic needs You should help the patient with basic needs in the home and provide support for getting groceries, prescriptions, and other personal needs.  Monitor the patient's symptoms If they are getting sicker, call his or her medical provider and tell them that the patient has, or is being evaluated for, COVID-19 infection. This will help the healthcare provider's office take steps to keep other people from getting infected. Ask the healthcare provider to call the local or state health department.  Limit the number of people who have contact with the patient If possible, have only one caregiver for the patient. Other household members should stay in another home or place of residence. If this is not possible, they should stay in another room, or be separated from the patient as much as possible. Use a separate bathroom, if available. Restrict visitors who do not have an essential need to be in the home.  Keep older adults, very young children, and other sick people away from the patient Keep older adults, very young children, and those who have compromised immune systems or chronic health conditions away from the patient. This includes people with chronic heart, lung, or kidney conditions, diabetes, and cancer.  Ensure good ventilation Make sure that shared spaces in the home have good air flow, such as from an air conditioner or an opened window, weather permitting.  Wash your hands often Wash your hands often and thoroughly with soap and water for at least 20 seconds. You can use an alcohol based hand sanitizer if soap and water are not available and if your hands are not visibly dirty. Avoid touching your eyes,  nose, and mouth with unwashed hands. Use disposable paper towels to dry your hands. If not available, use dedicated cloth towels and replace them when they become wet.  Wear a facemask and gloves Wear a disposable facemask at all times in the room and gloves when you touch or have contact with the patient's blood, body fluids, and/or secretions or excretions, such as sweat, saliva, sputum, nasal mucus, vomit, urine, or feces.  Ensure the mask fits over your nose and mouth tightly, and do not touch it during use. Throw out disposable facemasks and gloves after using them. Do not reuse. Wash your hands immediately after removing your facemask and gloves. If your personal clothing becomes contaminated, carefully remove clothing and launder. Wash your hands after handling contaminated clothing. Place all used disposable facemasks, gloves, and other waste in a lined container before disposing them with other household waste. Remove gloves and wash your hands immediately after handling these items.  Do not share dishes, glasses, or other household items with the patient Avoid sharing household items. You should not share dishes, drinking glasses, cups, eating utensils, towels, bedding, or other items with a patient who is confirmed to have, or being evaluated for, COVID-19 infection. After the  person uses these items, you should wash them thoroughly with soap and water.  Wash laundry thoroughly Immediately remove and wash clothes or bedding that have blood, body fluids, and/or secretions or excretions, such as sweat, saliva, sputum, nasal mucus, vomit, urine, or feces, on them. Wear gloves when handling laundry from the patient. Read and follow directions on labels of laundry or clothing items and detergent. In general, wash and dry with the warmest temperatures recommended on the label.  Clean all areas the individual has used often Clean all touchable surfaces, such as counters, tabletops,  doorknobs, bathroom fixtures, toilets, phones, keyboards, tablets, and bedside tables, every day. Also, clean any surfaces that may have blood, body fluids, and/or secretions or excretions on them. Wear gloves when cleaning surfaces the patient has come in contact with. Use a diluted bleach solution (e.g., dilute bleach with 1 part bleach and 10 parts water) or a household disinfectant with a label that says EPA-registered for coronaviruses. To make a bleach solution at home, add 1 tablespoon of bleach to 1 quart (4 cups) of water. For a larger supply, add  cup of bleach to 1 gallon (16 cups) of water. Read labels of cleaning products and follow recommendations provided on product labels. Labels contain instructions for safe and effective use of the cleaning product including precautions you should take when applying the product, such as wearing gloves or eye protection and making sure you have good ventilation during use of the product. Remove gloves and wash hands immediately after cleaning.  Monitor yourself for signs and symptoms of illness Caregivers and household members are considered close contacts, should monitor their health, and will be asked to limit movement outside of the home to the extent possible. Follow the monitoring steps for close contacts listed on the symptom monitoring form.   ? If you have additional questions, contact your local health department or call the epidemiologist on call at [phone] (available 24/7). ? This guidance is subject to change. For the most up-to-date guidance from [facility], please refer to their website: [email or web address]

## 2022-03-23 NOTE — Telephone Encounter (Signed)
Message from Oneta Rack sent at 03/23/2022  8:04 AM EDT  Summary: Tested Positive COVID   Patient tested positive for COVID on 03/21/2022. Patient experiencing coughing, runny nose, watery eyes, no energy, body aches, no shortness breath. Patient requesting to speak with PCP nurse           Call History   Type Contact Phone/Fax User  03/23/2022 08:01 AM EDT Phone (Incoming) Blakley, Jenny Reichmann (Self) [phone] [place] Gammon   Reason for Disposition  [1] [id number] diagnosed by positive lab test (e.g., PCR, rapid self-test kit) AND [2] mild symptoms (e.g., cough, fever, others) AND [6] no complications or SOB  Answer Assessment - Initial Assessment Questions 1. COVID-19 DIAGNOSIS: "How do you know that you have COVID?" (e.g., positive lab test or self-test, diagnosed by doctor or NP/PA, symptoms after exposure).     Positive Covid home test last night.    Sat. Symptoms started.   We were on a cruise to [place].   We got back last night.         2. COVID-19 EXPOSURE: "Was there any known exposure to COVID before the symptoms began?" CDC Definition of close contact: within 6 feet (2 meters) for a total of 15 minutes or more over a 24-hour period.      On a cruise 3. ONSET: "When did the COVID-19 symptoms start?"      Saturday 4. WORST SYMPTOM: "What is your worst symptom?" (e.g., cough, fever, shortness of breath, muscle aches)     Not asked 5. COUGH: "Do you have a cough?" If Yes, ask: "How bad is the cough?"       Yes  a non productive one 6. FEVER: "Do you have a fever?" If Yes, ask: "What is your temperature, how was it measured, and when did it start?"     No don't feel like it but I have not checked it with a thermometer. 7. RESPIRATORY STATUS: "Describe your breathing?" (e.g., normal; shortness of breath, wheezing, unable to speak)    No  Shortness of breath or tightness in chest 8. BETTER-SAME-WORSE: "Are you getting better, staying the same or getting worse compared to  yesterday?"  If getting worse, ask, "In what way?"     Not asked 9. OTHER SYMPTOMS: "Do you have any other symptoms?"  (e.g., chills, fatigue, headache, loss of smell or taste, muscle pain, sore throat)     Coughing, runny nose, watery eyes, body aches, chills, runny nose.   No nausea or GI symptoms.  No energy. 10. HIGH RISK DISEASE: "Do you have any chronic medical problems?" (e.g., asthma, heart or lung disease, weak immune system, obesity, etc.)       No other than age 76. VACCINE: "Have you had the COVID-19 vaccine?" If Yes, ask: "Which one, how many shots, when did you get it?"       Not asked 12. PREGNANCY: "Is there any chance you are pregnant?" "When was your last menstrual period?"       N/A 13. O2 SATURATION MONITOR:  "Do you use an oxygen saturation monitor (pulse oximeter) at home?" If Yes, ask "What is your reading (oxygen level) today?" "What is your usual oxygen saturation reading?" (e.g., 95%)       94-96% consistently since monitoring it.  Protocols used: Coronavirus (NOTRR-11) Diagnosed or Suspected-A-AH

## 2022-03-23 NOTE — Telephone Encounter (Signed)
Scheduled for today at 4 PM

## 2022-03-23 NOTE — Progress Notes (Signed)
Date:  03/23/2022   Name:  Jeff Mcmahon   DOB:  05/15/1946   MRN:  [id number]   Chief Complaint: Covid Positive (Symptoms started Saturday after a cruise. Started with body aches, sore throat and nasal congestion. Took test on Sunday and it was positive. Fever off and on- 100.2. O2 94-96%- took mucinex today- cough is better/)  I connected with this patient Jeff Mcmahon at his home, , by telephone at the patient's home.  I verified that I am speaking with the correct person using two identifiers. This visit was conducted via telephone due to the Covid-19 outbreak from my office at [facility] in [place], [place]. I discussed the limitations, risks, security and privacy concerns of performing an evaluation and management service by telephone. I also discussed with the patient that there may be a patient responsible charge related to this service. The patient expressed understanding and agreed to proceed.      Lab Results  Component Value Date   NA 138 12/21/2021   K 4.7 12/21/2021   CO2 27 12/21/2021   GLUCOSE 89 12/21/2021   BUN 14 12/21/2021   CREATININE 0.77 12/21/2021   CALCIUM 9.3 12/21/2021   EGFR 94 12/21/2021   GFRNONAA 84 05/16/2019   Lab Results  Component Value Date   CHOL 169 12/21/2021   HDL 41 12/21/2021   LDLCALC 114 (H) 12/21/2021   TRIG 74 12/21/2021   CHOLHDL 3.9 12/31/2019   Lab Results  Component Value Date   TSH 2.430 07/22/2017   Lab Results  Component Value Date   HGBA1C 5.4 12/21/2021   Lab Results  Component Value Date   WBC 5.3 07/22/2017   HGB 16.0 07/22/2017   HCT 47.7 07/22/2017   MCV 96 07/22/2017   PLT 236 07/22/2017   Lab Results  Component Value Date   ALT 22 07/25/2018   AST 20 07/25/2018   ALKPHOS 103 07/25/2018   BILITOT 0.6 07/25/2018   No results found for: "25OHVITD2", "25OHVITD3", "VD25OH"   Review of Systems  Patient Active Problem List   Diagnosis Date Noted   Retinal tear of right eye 12/21/2021    Dysphagia    Osteoarthritis 10/02/2015   Prediabetes 09/09/2015   Personal history of colonic polyps 09/05/2015   Hyperlipidemia 09/04/2015   GERD (gastroesophageal reflux disease) 09/04/2015   Dry eyes 09/04/2015    No Known Allergies  Past Surgical History:  Procedure Laterality Date   COLONOSCOPY     COLONOSCOPY WITH PROPOFOL N/A 06/02/2020   Procedure: COLONOSCOPY WITH PROPOFOL;  Surgeon: Lin Landsman, MD;  Location: [facility] ENDOSCOPY;  Service: Gastroenterology;  Laterality: N/A;   ESOPHAGOGASTRODUODENOSCOPY     ESOPHAGOGASTRODUODENOSCOPY (EGD) WITH PROPOFOL N/A 09/02/2020   Procedure: ESOPHAGOGASTRODUODENOSCOPY (EGD) WITH PROPOFOL;  Surgeon: Lin Landsman, MD;  Location: [facility];  Service: Gastroenterology;  Laterality: N/A;   EXCISION MASS NECK N/A 09/07/2016   Procedure: EXCISION MASS NECK;  Surgeon: Florene Glen, MD;  Location: [facility] ORS;  Service: General;  Laterality: N/A;   EXCISION OF BACK LESION N/A 09/07/2016   Procedure: EXCISION OF BACK LESION;  Surgeon: Florene Glen, MD;  Location: [facility] ORS;  Service: General;  Laterality: N/A;   MOUTH SURGERY     dental procedure   RETINAL LASER PROCEDURE Right 04/06/2021   [facility] in [place] History   Tobacco Use   Smoking status: Never   Smokeless tobacco: Never   Tobacco comments:  smoking cessation materials not required  Vaping Use   Vaping Use: Never used  Substance Use Topics   Alcohol use: Yes    Comment: social   Drug use: No     Medication list has been reviewed and updated.  Current Meds  Medication Sig   acetaminophen (TYLENOL) 500 MG tablet Take 500 mg by mouth every 6 (six) hours as needed.   famotidine (PEPCID) 40 MG tablet Take 1 tablet (40 mg total) by mouth daily.   Misc Natural Products (GLUCOSAMINE CHONDROITIN ADV) TABS Take by mouth.   MULTIPLE VITAMIN PO Take 1 tablet by mouth daily. Also taking Airborne Vit C   Multiple  Vitamins-Minerals (AIRBORNE PO) Take by mouth.   Omega-3 Fatty Acids (FISH OIL) 1000 MG CAPS Take 1 capsule (1,000 mg total) by mouth daily.   Polyethyl Glycol-Propyl Glycol (SYSTANE) 0.4-0.3 % SOLN Apply to eye.   psyllium (METAMUCIL) 58.6 % powder Take 1 packet by mouth 3 (three) times daily.   sodium chloride (OCEAN) 0.65 % SOLN nasal spray Place 1 spray into both nostrils as needed for congestion.    triamcinolone (NASACORT) 55 MCG/ACT AERO nasal inhaler Place 2 sprays into the nose daily. Dr. Kathyrn Sheriff       03/23/2022    4:19 PM 06/22/2021    8:50 AM 12/17/2020    7:56 AM 07/01/2020    8:00 AM  GAD 7 : Generalized Anxiety Score  Nervous, Anxious, on Edge 0 0 0 0  Control/stop worrying 0 0 0 0  Worry too much - different things 0 0 0 0  Trouble relaxing 0 0 0 0  Restless 0 0 0 0  Easily annoyed or irritable 0 0 0 0  Afraid - awful might happen 0 0 0 0  Total GAD 7 Score 0 0 0 0  Anxiety Difficulty Not difficult at all Not difficult at all         03/23/2022    4:18 PM 06/22/2021    8:50 AM 04/08/2021   10:29 AM  Depression screen PHQ 2/9  Decreased Interest 0 0 0  Down, Depressed, Hopeless 0 0 0  PHQ - 2 Score 0 0 0  Altered sleeping 0 0   Tired, decreased energy 0 0   Change in appetite 0 0   Feeling bad or failure about yourself  0 0   Trouble concentrating 0 0   Moving slowly or fidgety/restless 0 0   Suicidal thoughts 0 0   PHQ-9 Score 0 0   Difficult doing work/chores Not difficult at all Not difficult at all     BP Readings from Last 3 Encounters:  12/21/21 136/82  06/22/21 120/68  05/29/21 138/80    Physical Exam  Wt Readings from Last 3 Encounters:  03/23/22 180 lb (81.6 kg)  12/21/21 176 lb (79.8 kg)  06/22/21 187 lb 9.6 oz (85.1 kg)    Temp 98.2 F (36.8 C) (Oral)   Ht $R'5\' 10"'Xg$  (1.778 m)   Wt 180 lb (81.6 kg)   SpO2 96%   BMI 25.83 kg/m   Assessment and Plan: 1. COVID New onset.  Persistent.  Stable.  Patient has control of his cough with  good oxygenation at the 94-96 range.  Fever control has been with Tylenol.  We will treat with molnupiravir 200 mg 4 capsules twice a day for 5 days.  Review of complications that patient is to go to the hospital would at least been done.  Patient will be in  isolation for the next 5 days and will need to wear a mask around individuals thereafter for another 5 days. - molnupiravir EUA (LAGEVRIO) 200 mg CAPS capsule; Take 4 capsules (800 mg total) by mouth 2 (two) times daily for 5 days.  Dispense: 40 capsule; Refill: 0    I spent 10 minutes with this patient, More than 50% of that time was spent in face to face education, counseling and care coordination.  Otilio Miu, MD

## 2022-03-23 NOTE — Telephone Encounter (Signed)
  Chief Complaint: Positive home test for Covid this morning. Symptoms: Dry cough, runny nose, watery eyes, no energy, body aches, chills that started Saturday while on a cruise.  Returned home last night. Frequency: Symptoms started Sat. While on cruise Pertinent Negatives: Patient denies shortness of breath, wheezing or chest tightness Disposition: '[]'$ ED /'[]'$ Urgent Care (no appt availability in office) / '[]'$ Appointment(In office/virtual)/ '[]'$  [facility]/ '[x]'$ Home Care/ '[]'$ Refused Recommended Disposition /'[]'$ [facility]/ '[]'$  Follow-up with PCP Additional Notes: Information sent to Dr. Otilio Miu at [facility] for her information.    Pt is managing symptoms with OTC Mucinex DM which is fine with him as far as an antiviral is concerned unless Dr. Ronnald Ramp feels he should take an antiviral.   I let him know someone would call him back.

## 2022-03-31 DIAGNOSIS — X32XXXA Exposure to sunlight, initial encounter: Secondary | ICD-10-CM | POA: Diagnosis not present

## 2022-03-31 DIAGNOSIS — L821 Other seborrheic keratosis: Secondary | ICD-10-CM | POA: Diagnosis not present

## 2022-03-31 DIAGNOSIS — R238 Other skin changes: Secondary | ICD-10-CM | POA: Diagnosis not present

## 2022-03-31 DIAGNOSIS — D225 Melanocytic nevi of trunk: Secondary | ICD-10-CM | POA: Diagnosis not present

## 2022-03-31 DIAGNOSIS — D2261 Melanocytic nevi of right upper limb, including shoulder: Secondary | ICD-10-CM | POA: Diagnosis not present

## 2022-03-31 DIAGNOSIS — B078 Other viral warts: Secondary | ICD-10-CM | POA: Diagnosis not present

## 2022-03-31 DIAGNOSIS — L570 Actinic keratosis: Secondary | ICD-10-CM | POA: Diagnosis not present

## 2022-03-31 DIAGNOSIS — D2262 Melanocytic nevi of left upper limb, including shoulder: Secondary | ICD-10-CM | POA: Diagnosis not present

## 2022-03-31 DIAGNOSIS — D2271 Melanocytic nevi of right lower limb, including hip: Secondary | ICD-10-CM | POA: Diagnosis not present

## 2022-03-31 DIAGNOSIS — D2272 Melanocytic nevi of left lower limb, including hip: Secondary | ICD-10-CM | POA: Diagnosis not present

## 2022-04-02 ENCOUNTER — Encounter: Payer: Self-pay | Admitting: Family Medicine

## 2022-04-14 ENCOUNTER — Ambulatory Visit (INDEPENDENT_AMBULATORY_CARE_PROVIDER_SITE_OTHER): Payer: Medicare Other

## 2022-04-14 VITALS — BP 118/70 | HR 79 | Ht 70.0 in | Wt 183.8 lb

## 2022-04-14 DIAGNOSIS — Z0000 Encounter for general adult medical examination without abnormal findings: Secondary | ICD-10-CM | POA: Diagnosis not present

## 2022-04-14 NOTE — Progress Notes (Signed)
Subjective:   Jeff Mcmahon is a 76 y.o. male who presents for Medicare Annual/Subsequent preventive examination.  I connected with  Jeff Mcmahon on 04/14/22 by an in person visit and verified that I am speaking with the correct person using two identifiers.  Patient Location: Other:  In Office  Provider Location: Office/Clinic  I discussed the limitations of evaluation and management by telemedicine. The patient expressed understanding and agreed to proceed.   Review of Systems    Defer to PCP Cardiac Risk Factors include: advanced age (>58mn, >>15women);male gender     Objective:    Today's Vitals   04/14/22 1003 04/14/22 1006  BP: 118/70   Pulse: 79   SpO2: 96%   Weight: 183 lb 12.8 oz (83.4 kg)   Height: '5\' 10"'$  (1.778 m)   PainSc:  2    Body mass index is 26.37 kg/m.     04/14/2022   10:08 AM 04/08/2021   10:30 AM 01/04/2021    8:18 AM 09/02/2020    8:33 AM 06/02/2020    7:40 AM 04/07/2020   10:46 AM 04/04/2019   10:36 AM  Advanced Directives  Does Patient Have a Medical Advance Directive? Yes Yes No Yes Yes Yes Yes  Type of ACorporate treasurerof AValleLiving will  Living will  [place]St. MaryLiving will [place]nut CreekLiving will  Does patient want to make changes to medical advance directive? No - Patient declined        Copy of [place] Chart?  Yes - validated most recent copy scanned in chart (See row information)    Yes - validated most recent copy scanned in chart (See row information) Yes - validated most recent copy scanned in chart (See row information)    Current Medications (verified) Outpatient Encounter Medications as of 04/14/2022  Medication Sig   acetaminophen (TYLENOL) 500 MG tablet Take 500 mg by mouth every 6 (six) hours as needed.   famotidine (PEPCID) 40 MG tablet Take 1 tablet (40 mg total) by mouth daily.   Misc Natural Products (GLUCOSAMINE CHONDROITIN ADV) TABS Take by  mouth.   MULTIPLE VITAMIN PO Take 1 tablet by mouth daily. Also taking Airborne Vit C   Omega-3 Fatty Acids (FISH OIL) 1000 MG CAPS Take 1 capsule (1,000 mg total) by mouth daily.   Polyethyl Glycol-Propyl Glycol (SYSTANE) 0.4-0.3 % SOLN Apply to eye.   psyllium (METAMUCIL) 58.6 % powder Take 1 packet by mouth 3 (three) times daily.   sodium chloride (OCEAN) 0.65 % SOLN nasal spray Place 1 spray into both nostrils as needed for congestion.    triamcinolone (NASACORT) 55 MCG/ACT AERO nasal inhaler Place 2 sprays into the nose daily. Dr. JKathyrn Sheriff  [DISCONTINUED] Multiple Vitamins-Minerals (AIRBORNE PO) Take by mouth.   No facility-administered encounter medications on file as of 04/14/2022.    Allergies (verified) Patient has no known allergies.   History: Past Medical History:  Diagnosis Date   Arthritis    Dysrhythmia 2007   H/O IRREGULAR HEART BEAT-SAW CARDIOLOGIST AND SAID IT WAS NOTHING TO BE CONCERNED WITH   GERD (gastroesophageal reflux disease)    H/O   Hyperlipidemia    Pre-diabetes    Past Surgical History:  Procedure Laterality Date   COLONOSCOPY     COLONOSCOPY WITH PROPOFOL N/A 06/02/2020   Procedure: COLONOSCOPY WITH PROPOFOL;  Surgeon: VLin Landsman MD;  Location: [facility];  Service: Gastroenterology;  Laterality: N/A;   ESOPHAGOGASTRODUODENOSCOPY  ESOPHAGOGASTRODUODENOSCOPY (EGD) WITH PROPOFOL N/A 09/02/2020   Procedure: ESOPHAGOGASTRODUODENOSCOPY (EGD) WITH PROPOFOL;  Surgeon: Lin Landsman, MD;  Location: [place];  Service: Gastroenterology;  Laterality: N/A;   EXCISION MASS NECK N/A 09/07/2016   Procedure: EXCISION MASS NECK;  Surgeon: Florene Glen, MD;  Location: [facility];  Service: General;  Laterality: N/A;   EXCISION OF BACK LESION N/A 09/07/2016   Procedure: EXCISION OF BACK LESION;  Surgeon: Florene Glen, MD;  Location: [facility];  Service: General;  Laterality: N/A;   MOUTH SURGERY     dental procedure   RETINAL LASER  PROCEDURE Right 04/06/2021   [facility] in [place]   Family History  Problem Relation Age of Onset   Lymphoma Mother 67   Hypertension Mother    Cancer Father        lung   Alcohol abuse Father    Mental illness Father    Heart disease Brother    Social History   Socioeconomic History   Marital status: Married    Spouse name: Not on file   Number of children: 2   Years of education: Not on file   Highest education level: Professional school degree (e.g., MD, DDS, DVM, JD)  Occupational History   Occupation: Retired  Tobacco Use   Smoking status: Never   Smokeless tobacco: Never   Tobacco comments:    smoking cessation materials not required  Vaping Use   Vaping Use: Never used  Substance and Sexual Activity   Alcohol use: Yes    Comment: social   Drug use: No   Sexual activity: Not Currently  Other Topics Concern   Not on file  Social History Narrative   Not on file   Social Determinants of Health   Financial Resource Strain: Low Risk  (04/14/2022)   Overall Financial Resource Strain (CARDIA)    Difficulty of Paying Living Expenses: Not hard at all  Food Insecurity: No Food Insecurity (04/14/2022)   Hunger Vital Sign    Worried About Running Out of Food in the Last Year: Never true    [place] in the Last Year: Never true  Transportation Needs: No Transportation Needs (04/14/2022)   PRAPARE - Hydrologist (Medical): No    Lack of Transportation (Non-Medical): No  Physical Activity: Sufficiently Active (04/14/2022)   Exercise Vital Sign    Days of Exercise per Week: 7 days    Minutes of Exercise per Session: 60 min  Stress: No Stress Concern Present (04/14/2022)   [place]    Feeling of Stress : Not at all  Social Connections: Moderately Integrated (04/14/2022)   Social Connection and Isolation Panel [NHANES]    Frequency of Communication with  Friends and Family: More than three times a week    Frequency of Social Gatherings with Friends and Family: Twice a week    Attends Religious Services: More than 4 times per year    Active Member of [employer] or Organizations: No    Attends Music therapist: Never    Marital Status: Married    Tobacco Counseling Counseling given: Not Answered Tobacco comments: smoking cessation materials not required   Clinical Intake:  Pre-visit preparation completed: Yes  Pain : 0-10 Pain Score: 2  Pain Type: Acute pain Pain Location: Face Pain Descriptors / Indicators: Burning, Tender     BMI - recorded: 26.37 Nutritional Risks: None Diabetes: No  How often do you need to have someone help you when you read instructions, pamphlets, or other written materials from your doctor or pharmacy?: (P) 1 - Never  Diabetic? No.  Interpreter Needed?: No  Information entered by :: Wyatt Haste, CMA   Activities of Daily Living    04/14/2022   10:09 AM 04/10/2022    9:05 AM  In your present state of health, do you have any difficulty performing the following activities:  Hearing? 0 0  Vision? 0 0  Difficulty concentrating or making decisions? 0 0  Walking or climbing stairs? 0 0  Dressing or bathing? 0 0  Doing errands, shopping? 0 0  Preparing Food and eating ? N N  Using the Toilet? N N  In the past six months, have you accidently leaked urine? N N  Do you have problems with loss of bowel control? N N  Managing your Medications? N N  Managing your Finances? N N  Housekeeping or managing your Housekeeping? N N    Patient Care Team: Juline Patch, MD as PCP - General (Family Medicine) Dasher, Rayvon Char, MD as Consulting Physician (Dermatology) Margaretha Sheffield, MD (Otolaryngology) Lin Landsman, MD as Consulting Physician (Gastroenterology)  Indicate any recent Medical Services you may have received from other than [facility] providers in the past year (date may be  approximate).     Assessment:   This is a routine wellness examination for Jaciel.  Hearing/Vision screen Hearing Screening - Comments:: No concerns Vision Screening - Comments:: Wears prescription eye glasses.  Dietary issues and exercise activities discussed: Current Exercise Habits: Home exercise routine, Type of exercise: walking, Time (Minutes): 60, Frequency (Times/Week): >7, Weekly Exercise (Minutes/Week): 0, Intensity: Moderate   Goals Addressed             This Visit's Progress    DIET - INCREASE WATER INTAKE   On track    Recommend to drink at least 6-8 8oz glasses of water per day.      Depression Screen    04/14/2022   10:09 AM 03/23/2022    4:18 PM 06/22/2021    8:50 AM 04/08/2021   10:29 AM 12/17/2020    7:56 AM 07/01/2020    8:00 AM 04/07/2020   10:45 AM  PHQ 2/9 Scores  PHQ - 2 Score 0 0 0 0 0 0 0  PHQ- 9 Score 0 0 0  0 0     Fall Risk    04/14/2022   10:09 AM 04/10/2022    9:05 AM 03/23/2022    4:18 PM 06/22/2021    8:49 AM 04/08/2021   10:31 AM  Fall Risk   Falls in the past year? 1 1 0 1 1  Number falls in past yr: 1 0 0 0 0  Injury with Fall? 0 0 0 1 1  Risk for fall due to :   No Fall Risks  No Fall Risks  Follow up Falls evaluation completed  Falls evaluation completed  Falls prevention discussed    FALL RISK PREVENTION PERTAINING TO THE HOME:  Any stairs in or around the home? Yes  If so, are there any without handrails? Yes  Home free of loose throw rugs in walkways, pet beds, electrical cords, etc? Yes  Adequate lighting in your home to reduce risk of falls? Yes   ASSISTIVE DEVICES UTILIZED TO PREVENT FALLS:  Life alert? No  Use of a cane, walker or w/c? No  Grab bars in the bathroom? No  Shower chair or bench in shower? No  Elevated toilet seat or a handicapped toilet? Yes   TIMED UP AND GO:  Was the test performed? Yes .   Gait steady and fast without use of assistive device  Cognitive Function:    12/21/2021    8:20 AM  MMSE  - Mini Mental State Exam  Orientation to time 5  Orientation to Place 5  Registration 3  Attention/ Calculation 5  Recall 3  Language- name 2 objects 2  Language- repeat 1  Language- follow 3 step command 3  Language- read & follow direction 1  Write a sentence 1  Copy design 1  Total score 30        04/14/2022   10:10 AM 04/07/2020   10:48 AM 04/04/2019   10:38 AM 02/22/2018    9:03 AM 02/21/2017    1:48 PM  6CIT Screen  What Year? 0 points 0 points 0 points 0 points 0 points  What month? 0 points 0 points 0 points 0 points 0 points  What time? 0 points 0 points 0 points 0 points 0 points  Count back from 20 0 points 0 points 0 points 0 points 0 points  Months in reverse 0 points 0 points 0 points 0 points 0 points  Repeat phrase 0 points 0 points 0 points 0 points 0 points  Total Score 0 points 0 points 0 points 0 points 0 points    Immunizations Immunization History  Administered Date(s) Administered   Influenza, High Dose Seasonal PF 04/10/2017, 04/15/2019   Influenza-Unspecified 04/09/2018, 04/22/2020, 04/15/2021   PFIZER(Purple Top)SARS-COV-2 Vaccination 08/29/2019, 09/26/2019, 05/09/2020, 11/05/2020, 04/15/2021   Pfizer Covid-19 Vaccine Bivalent Booster 42yr & up 12/07/2021   Pneumococcal Conjugate-13 09/04/2011   Pneumococcal Polysaccharide-23 09/04/2015   Tdap 07/23/2018   Zoster Recombinat (Shingrix) 02/01/2017, 04/10/2017   Zoster, Live 09/04/2015    TDAP status: Up to date  Flu Vaccine status: Due, Education has been provided regarding the importance of this vaccine. Advised may receive this vaccine at local pharmacy or Health Dept. Aware to provide a copy of the vaccination record if obtained from local pharmacy or Health Dept. Verbalized acceptance and understanding.  Pneumococcal vaccine status: Up to date  Covid-19 vaccine status: Completed vaccines  Qualifies for Shingles Vaccine? Yes   Zostavax completed No   Shingrix Completed?: Yes  Screening  Tests Health Maintenance  Topic Date Due   COVID-19 Vaccine (7 - Pfizer series) 04/09/2022   INFLUENZA VACCINE  11/07/2022 (Originally 03/09/2022)   COLONOSCOPY (Pts 45-448yrInsurance coverage will need to be confirmed)  06/02/2025   TETANUS/TDAP  07/23/2028   Pneumonia Vaccine 75Years old  Completed   Hepatitis C Screening  Completed   Zoster Vaccines- Shingrix  Completed   HPV VACCINES  Aged Out    Health Maintenance  Health Maintenance Due  Topic Date Due   COVID-19 Vaccine (7 - Pfizer series) 04/09/2022    Colorectal cancer screening: Type of screening: Colonoscopy. Completed 06/02/2020. Repeat every 5 years  Lung Cancer Screening: (Low Dose CT Chest recommended if Age 75-80ears, 30 pack-year currently smoking OR have quit w/in 15years.) does not qualify.   Lung Cancer Screening Referral: N/A  Additional Screening:  Hepatitis C Screening: does qualify; Completed 12/30/2015  Vision Screening: Recommended annual ophthalmology exams for early detection of glaucoma and other disorders of the eye. Is the patient up to date with their annual eye exam?  Yes  Who is the provider or what is  the name of the office in which the patient attends annual eye exams? Dr Mallie Mussel [facility] If pt is not established with a provider, would they like to be referred to a provider to establish care? No .   Dental Screening: Recommended annual dental exams for proper oral hygiene  Community Resource Referral / Chronic Care Management: CRR required this visit?  No   CCM required this visit?  No      Plan:     I have personally reviewed and noted the following in the patient's chart:   Medical and social history Use of alcohol, tobacco or illicit drugs  Current medications and supplements including opioid prescriptions. Patient is not currently taking opioid prescriptions. Functional ability and status Nutritional status Physical activity Advanced directives List of other  physicians Hospitalizations, surgeries, and ER visits in previous 12 months Vitals Screenings to include cognitive, depression, and falls Referrals and appointments  In addition, I have reviewed and discussed with patient certain preventive protocols, quality metrics, and best practice recommendations. A written personalized care plan for preventive services as well as general preventive health recommendations were provided to patient.     Clista Bernhardt, [place]   04/14/2022   Nurse Notes: None.

## 2022-04-28 DIAGNOSIS — Z23 Encounter for immunization: Secondary | ICD-10-CM | POA: Diagnosis not present

## 2022-06-23 ENCOUNTER — Ambulatory Visit
Admission: RE | Admit: 2022-06-23 | Discharge: 2022-06-23 | Disposition: A | Discharge status: Home / Self Care | Payer: Medicare Other | Source: Ambulatory Visit | Attending: Family Medicine | Admitting: Family Medicine

## 2022-06-23 ENCOUNTER — Encounter: Payer: Self-pay | Admitting: Family Medicine

## 2022-06-23 ENCOUNTER — Ambulatory Visit
Admission: RE | Admit: 2022-06-23 | Discharge: 2022-06-23 | Disposition: A | Discharge status: Home / Self Care | Payer: Medicare Other | Attending: Family Medicine | Admitting: Family Medicine

## 2022-06-23 ENCOUNTER — Ambulatory Visit (INDEPENDENT_AMBULATORY_CARE_PROVIDER_SITE_OTHER): Payer: Medicare Other | Admitting: Family Medicine

## 2022-06-23 VITALS — BP 138/80 | HR 76 | Ht 70.0 in | Wt 182.0 lb

## 2022-06-23 DIAGNOSIS — M4602 Spinal enthesopathy, cervical region: Secondary | ICD-10-CM | POA: Diagnosis not present

## 2022-06-23 DIAGNOSIS — Z23 Encounter for immunization: Secondary | ICD-10-CM

## 2022-06-23 DIAGNOSIS — K2100 Gastro-esophageal reflux disease with esophagitis, without bleeding: Secondary | ICD-10-CM

## 2022-06-23 DIAGNOSIS — M47812 Spondylosis without myelopathy or radiculopathy, cervical region: Secondary | ICD-10-CM | POA: Diagnosis not present

## 2022-06-23 DIAGNOSIS — Z8711 Personal history of peptic ulcer disease: Secondary | ICD-10-CM

## 2022-06-23 DIAGNOSIS — R030 Elevated blood-pressure reading, without diagnosis of hypertension: Secondary | ICD-10-CM | POA: Diagnosis not present

## 2022-06-23 DIAGNOSIS — M542 Cervicalgia: Secondary | ICD-10-CM

## 2022-06-23 MED ORDER — FAMOTIDINE 40 MG PO TABS: 40.0000 mg | ORAL_TABLET | Freq: Every day | ORAL | 1 refills | Status: DC

## 2022-06-23 NOTE — Progress Notes (Signed)
Date:  06/23/2022   Name:  Jeff Mcmahon   DOB:  12-28-1945   MRN:  [id number]   Chief Complaint: Gastroesophageal Reflux and pneumonia 20  Gastroesophageal Reflux He reports no chest pain, no dysphagia, no heartburn, no nausea or no wheezing. This is a chronic problem. The current episode started more than 1 year ago. The problem has been gradually improving.  Hypertension This is a new problem. The current episode started today. The problem has been waxing and waning since onset. The problem is controlled. Associated symptoms include neck pain. Pertinent negatives include no anxiety, blurred vision, chest pain, palpitations or shortness of breath. Past treatments include lifestyle changes.  Neck Pain  This is a chronic problem. The current episode started more than 1 month ago. The problem has been waxing and waning. The pain is associated with nothing. The quality of the pain is described as aching. The patient is experiencing no pain. The symptoms are aggravated by twisting (to left). Pertinent negatives include no chest pain, numbness or visual change. He has tried acetaminophen for the symptoms.    Lab Results  Component Value Date   NA 138 12/21/2021   K 4.7 12/21/2021   CO2 27 12/21/2021   GLUCOSE 89 12/21/2021   BUN 14 12/21/2021   CREATININE 0.77 12/21/2021   CALCIUM 9.3 12/21/2021   EGFR 94 12/21/2021   GFRNONAA 84 05/16/2019   Lab Results  Component Value Date   CHOL 169 12/21/2021   HDL 41 12/21/2021   LDLCALC 114 (H) 12/21/2021   TRIG 74 12/21/2021   CHOLHDL 3.9 12/31/2019   Lab Results  Component Value Date   TSH 2.430 07/22/2017   Lab Results  Component Value Date   HGBA1C 5.4 12/21/2021   Lab Results  Component Value Date   WBC 5.3 07/22/2017   HGB 16.0 07/22/2017   HCT 47.7 07/22/2017   MCV 96 07/22/2017   PLT 236 07/22/2017   Lab Results  Component Value Date   ALT 22 07/25/2018   AST 20 07/25/2018   ALKPHOS 103 07/25/2018   BILITOT 0.6  07/25/2018   No results found for: "25OHVITD2", "25OHVITD3", "VD25OH"   Review of Systems  Constitutional:  Negative for unexpected weight change.  Eyes:  Negative for blurred vision and visual disturbance.  Respiratory:  Negative for shortness of breath and wheezing.   Cardiovascular:  Negative for chest pain, palpitations and leg swelling.  Gastrointestinal:  Negative for abdominal distention, blood in stool, dysphagia, heartburn and nausea.  Endocrine: Negative for polydipsia and polyuria.  Genitourinary:  Negative for difficulty urinating.  Musculoskeletal:  Positive for neck pain.  Neurological:  Negative for numbness.    Patient Active Problem List   Diagnosis Date Noted   Retinal tear of right eye 12/21/2021   Dysphagia    Osteoarthritis 10/02/2015   Prediabetes 09/09/2015   Personal history of colonic polyps 09/05/2015   Hyperlipidemia 09/04/2015   GERD (gastroesophageal reflux disease) 09/04/2015   Dry eyes 09/04/2015    No Known Allergies  Past Surgical History:  Procedure Laterality Date   COLONOSCOPY     COLONOSCOPY WITH PROPOFOL N/A 06/02/2020   Procedure: COLONOSCOPY WITH PROPOFOL;  Surgeon: Lin Landsman, MD;  Location: [place];  Service: Gastroenterology;  Laterality: N/A;   ESOPHAGOGASTRODUODENOSCOPY     ESOPHAGOGASTRODUODENOSCOPY (EGD) WITH PROPOFOL N/A 09/02/2020   Procedure: ESOPHAGOGASTRODUODENOSCOPY (EGD) WITH PROPOFOL;  Surgeon: Lin Landsman, MD;  Location: [place];  Service: Gastroenterology;  Laterality: N/A;  EXCISION MASS NECK N/A 09/07/2016   Procedure: EXCISION MASS NECK;  Surgeon: Florene Glen, MD;  Location: [facility];  Service: General;  Laterality: N/A;   EXCISION OF BACK LESION N/A 09/07/2016   Procedure: EXCISION OF BACK LESION;  Surgeon: Florene Glen, MD;  Location: [facility];  Service: General;  Laterality: N/A;   MOUTH SURGERY     dental procedure   RETINAL LASER PROCEDURE Right 04/06/2021   [facility] [place] History   Tobacco Use   Smoking status: Never   Smokeless tobacco: Never   Tobacco comments:    smoking cessation materials not required  Vaping Use   Vaping Use: Never used  Substance Use Topics   Alcohol use: Yes    Comment: social   Drug use: No     Medication list has been reviewed and updated.  Current Meds  Medication Sig   acetaminophen (TYLENOL) 500 MG tablet Take 500 mg by mouth every 6 (six) hours as needed.   famotidine (PEPCID) 40 MG tablet Take 1 tablet (40 mg total) by mouth daily.   Misc Natural Products (GLUCOSAMINE CHONDROITIN ADV) TABS Take by mouth.   Omega-3 Fatty Acids (FISH OIL) 1000 MG CAPS Take 1 capsule (1,000 mg total) by mouth daily.   Polyethyl Glycol-Propyl Glycol (SYSTANE) 0.4-0.3 % SOLN Apply to eye.   psyllium (METAMUCIL) 58.6 % powder Take 1 packet by mouth 3 (three) times daily.   sodium chloride (OCEAN) 0.65 % SOLN nasal spray Place 1 spray into both nostrils as needed for congestion.    triamcinolone (NASACORT) 55 MCG/ACT AERO nasal inhaler Place 2 sprays into the nose daily. Dr. Kathyrn Sheriff       06/23/2022    8:07 AM 03/23/2022    4:19 PM 06/22/2021    8:50 AM 12/17/2020    7:56 AM  GAD 7 : Generalized Anxiety Score  Nervous, Anxious, on Edge 0 0 0 0  Control/stop worrying 0 0 0 0  Worry too much - different things 0 0 0 0  Trouble relaxing 0 0 0 0  Restless 0 0 0 0  Easily annoyed or irritable 0 0 0 0  Afraid - awful might happen 0 0 0 0  Total GAD 7 Score 0 0 0 0  Anxiety Difficulty Not difficult at all Not difficult at all Not difficult at all        06/23/2022    8:07 AM 04/14/2022   10:09 AM 03/23/2022    4:18 PM  Depression screen PHQ 2/9  Decreased Interest 0 0 0  Down, Depressed, Hopeless 0 0 0  PHQ - 2 Score 0 0 0  Altered sleeping 0 0 0  Tired, decreased energy 0 0 0  Change in appetite 0 0 0  Feeling bad or failure about yourself  0 0 0  Trouble concentrating 0 0 0   Moving slowly or fidgety/restless 0 0 0  Suicidal thoughts 0 0 0  PHQ-9 Score 0 0 0  Difficult doing work/chores Not difficult at all Not difficult at all Not difficult at all    BP Readings from Last 3 Encounters:  06/23/22 138/80  04/14/22 118/70  12/21/21 136/82    Physical Exam HENT:     Head: Normocephalic.     Right Ear: External ear normal.     Left Ear: External ear normal.     Nose: Nose normal.  Eyes:     General: No scleral icterus.  Right eye: No discharge.        Left eye: No discharge.     Conjunctiva/sclera: Conjunctivae normal.     Pupils: Pupils are equal, round, and reactive to light.  Neck:     Thyroid: No thyromegaly.     Vascular: No JVD.     Trachea: No tracheal deviation.  Cardiovascular:     Rate and Rhythm: Normal rate and regular rhythm.     Heart sounds: Normal heart sounds. No murmur heard.    No friction rub. No gallop.  Pulmonary:     Effort: No respiratory distress.     Breath sounds: Normal breath sounds. No wheezing or rales.  Abdominal:     General: Bowel sounds are normal.     Palpations: Abdomen is soft. There is no mass.     Tenderness: There is no abdominal tenderness. There is no guarding or rebound.  Musculoskeletal:        General: No tenderness.     Cervical back: Neck supple. Spasms present. No tenderness or bony tenderness. Decreased range of motion.  Lymphadenopathy:     Cervical: No cervical adenopathy.  Skin:    General: Skin is warm.     Findings: No rash.  Neurological:     Mental Status: He is alert and oriented to person, place, and time.     Cranial Nerves: No cranial nerve deficit.     Deep Tendon Reflexes: Reflexes are normal and symmetric.     Wt Readings from Last 3 Encounters:  06/23/22 182 lb (82.6 kg)  04/14/22 183 lb 12.8 oz (83.4 kg)  03/23/22 180 lb (81.6 kg)    BP 138/80 (BP Location: Right Arm, Cuff Size: Large)   Pulse 76   Ht _0  (1.778 m)   Wt 182 lb (82.6 kg)   SpO2 99%   BMI  26.11 kg/m   Assessment and Plan:   1. Elevated blood pressure reading without diagnosis of hypertension New onset.  Waxing and waning.  Controlled.  Currently on Mediterranean diet.  Blood pressure 138/80.  Asymptomatic.  We discussed multiple approaches and at this time we will also add in a Dash diet approach with limited exposure to sodium.  We will recheck blood pressure after the first of the year with the possibility of initiating either low-dose ACE or lower dose hydrochlorothiazide.  2. Gastroesophageal reflux disease with esophagitis without hemorrhage Patient with history of reflux is doing well.  Controlled.  Stable.  Currently takes famotidine 40 mg at night with an over-the-counter 20 mg dosing in the morning and this is doing well to control his acid reflux. - famotidine (PEPCID) 40 MG tablet; Take 1 tablet (40 mg total) by mouth daily.  Dispense: 90 tablet; Refill: 1  3. History of gastric ulcer As noted above this is under control there is also concern with problem #4 - famotidine (PEPCID) 40 MG tablet; Take 1 tablet (40 mg total) by mouth daily.  Dispense: 90 tablet; Refill: 1  4. Neck pain Chronic.  Persistent.  Trapezius pain with limited range of motion particularly rotation to the left.  There is trapezius spasm with no spinous process tenderness.  I suspect there is some degenerative changes and we will x-ray the cervical spine and pending results either initiate referral to sports medicine, referral for physical therapy, or the possibility of low-dose NSAIDs such as Advil with careful concerns for blood pressure and his history of gastric ulcer.  In the meantime patient's been instructed  to take Tylenol on a regular basis for symptomatic relief. - DG Cervical Spine Complete; Future   Otilio Miu, MD

## 2022-06-23 NOTE — Patient Instructions (Addendum)
Managing Your Hypertension Hypertension, also called high blood pressure, is when the force of the blood pressing against the walls of the arteries is too strong. Arteries are blood vessels that carry blood from your heart throughout your body. Hypertension forces the heart to work harder to pump blood and may cause the arteries to become narrow or stiff. Understanding blood pressure readings A blood pressure reading includes a higher number over a lower number: The first, or top, number is called the systolic pressure. It is a measure of the pressure in your arteries as your heart beats. The second, or bottom number, is called the diastolic pressure. It is a measure of the pressure in your arteries as the heart relaxes. For most people, a normal blood pressure is below 120/80. Your personal target blood pressure may vary depending on your medical conditions, your age, and other factors. Blood pressure is classified into four stages. Based on your blood pressure reading, your health care provider may use the following stages to determine what type of treatment you need, if any. Systolic pressure and diastolic pressure are measured in a unit called millimeters of mercury (mmHg). Normal Systolic pressure: below 120. Diastolic pressure: below 80. Elevated Systolic pressure: 120-129. Diastolic pressure: below 80. Hypertension stage 1 Systolic pressure: 130-139. Diastolic pressure: 80-89. Hypertension stage 2 Systolic pressure: 140 or above. Diastolic pressure: 90 or above. How can this condition affect me? Managing your hypertension is very important. Over time, hypertension can damage the arteries and decrease blood flow to parts of the body, including the brain, heart, and kidneys. Having untreated or uncontrolled hypertension can lead to: A heart attack. A stroke. A weakened blood vessel (aneurysm). Heart failure. Kidney damage. Eye damage. Memory and concentration problems. Vascular  dementia. What actions can I take to manage this condition? Hypertension can be managed by making lifestyle changes and possibly by taking medicines. Your health care provider will help you make a plan to bring your blood pressure within a normal range. You may be referred for counseling on a healthy diet and physical activity. Nutrition  Eat a diet that is high in fiber and potassium, and low in salt (sodium), added sugar, and fat. An example eating plan is called the DASH diet. DASH stands for Dietary Approaches to Stop Hypertension. To eat this way: Eat plenty of fresh fruits and vegetables. Try to fill one-half of your plate at each meal with fruits and vegetables. Eat whole grains, such as whole-wheat pasta, brown rice, or whole-grain bread. Fill about one-fourth of your plate with whole grains. Eat low-fat dairy products. Avoid fatty cuts of meat, processed or cured meats, and poultry with skin. Fill about one-fourth of your plate with lean proteins such as fish, chicken without skin, beans, eggs, and tofu. Avoid pre-made and processed foods. These tend to be higher in sodium, added sugar, and fat. Reduce your daily sodium intake. Many people with hypertension should eat less than 1,500 mg of sodium a day. Lifestyle  Work with your health care provider to maintain a healthy body weight or to lose weight. Ask what an ideal weight is for you. Get at least 30 minutes of exercise that causes your heart to beat faster (aerobic exercise) most days of the week. Activities may include walking, swimming, or biking. Include exercise to strengthen your muscles (resistance exercise), such as weight lifting, as part of your weekly exercise routine. Try to do these types of exercises for 30 minutes at least 3 days a week. Do   not use any products that contain nicotine or tobacco. These products include cigarettes, chewing tobacco, and vaping devices, such as e-cigarettes. If you need help quitting, ask your  health care provider. Control any long-term (chronic) conditions you have, such as high cholesterol or diabetes. Identify your sources of stress and find ways to manage stress. This may include meditation, deep breathing, or making time for fun activities. Alcohol use Do not drink alcohol if: Your health care provider tells you not to drink. You are pregnant, may be pregnant, or are planning to become pregnant. If you drink alcohol: Limit how much you have to: 0-1 drink a day for women. 0-2 drinks a day for men. Know how much alcohol is in your drink. In the [place] one drink equals one 12 oz bottle of beer (355 mL), one 5 oz glass of wine (148 mL), or one 1 oz glass of hard liquor (44 mL). Medicines Your health care provider may prescribe medicine if lifestyle changes are not enough to get your blood pressure under control and if: Your systolic blood pressure is 130 or higher. Your diastolic blood pressure is 80 or higher. Take medicines only as told by your health care provider. Follow the directions carefully. Blood pressure medicines must be taken as told by your health care provider. The medicine does not work as well when you skip doses. Skipping doses also puts you at risk for problems. Monitoring Before you monitor your blood pressure: Do not smoke, drink caffeinated beverages, or exercise within 30 minutes before taking a measurement. Use the bathroom and empty your bladder (urinate). Sit quietly for at least 5 minutes before taking measurements. Monitor your blood pressure at home as told by your health care provider. To do this: Sit with your back straight and supported. Place your feet flat on the floor. Do not cross your legs. Support your arm on a flat surface, such as a table. Make sure your upper arm is at heart level. Each time you measure, take two or three readings one minute apart and record the results. You may also need to have your blood pressure checked regularly by  your health care provider. General information Talk with your health care provider about your diet, exercise habits, and other lifestyle factors that may be contributing to hypertension. Review all the medicines you take with your health care provider because there may be side effects or interactions. Keep all follow-up visits. Your health care provider can help you create and adjust your plan for managing your high blood pressure. Where to find more information National Heart, Lung, and Blood Institute: www.nhlbi.nih.gov American Heart Association: www.heart.org Contact a health care provider if: You think you are having a reaction to medicines you have taken. You have repeated (recurrent) headaches. You feel dizzy. You have swelling in your ankles. You have trouble with your vision. Get help right away if: You develop a severe headache or confusion. You have unusual weakness or numbness, or you feel faint. You have severe pain in your chest or abdomen. You vomit repeatedly. You have trouble breathing. These symptoms may be an emergency. Get help right away. Call 911. Do not wait to see if the symptoms will go away. Do not drive yourself to the hospital. Summary Hypertension is when the force of blood pumping through your arteries is too strong. If this condition is not controlled, it may put you at risk for serious complications. Your personal target blood pressure may vary depending on your medical conditions,   your age, and other factors. For most people, a normal blood pressure is less than 120/80. Hypertension is managed by lifestyle changes, medicines, or both. Lifestyle changes to help manage hypertension include losing weight, eating a healthy, low-sodium diet, exercising more, stopping smoking, and limiting alcohol. This information is not intended to replace advice given to you by your health care provider. Make sure you discuss any questions you have with your health care  provider. Document Revised: 04/09/2021 Document Reviewed: 04/09/2021 Elsevier Patient Education  [place] Eating Plan DASH stands for Dietary Approaches to Stop Hypertension. The DASH eating plan is a healthy eating plan that has been shown to: Reduce high blood pressure (hypertension). Reduce your risk for type 2 diabetes, heart disease, and stroke. Help with weight loss. What are tips for following this plan? Reading food labels Check food labels for the amount of salt (sodium) per serving. Choose foods with less than 5 percent of the Daily Value of sodium. Generally, foods with less than 300 milligrams (mg) of sodium per serving fit into this eating plan. To find whole grains, look for the word "whole" as the first word in the ingredient list. Shopping Buy products labeled as "low-sodium" or "no salt added." Buy fresh foods. Avoid canned foods and pre-made or frozen meals. Cooking Avoid adding salt when cooking. Use salt-free seasonings or herbs instead of table salt or sea salt. Check with your health care provider or pharmacist before using salt substitutes. Do not fry foods. Cook foods using healthy methods such as baking, boiling, grilling, roasting, and broiling instead. Cook with heart-healthy oils, such as olive, canola, avocado, soybean, or sunflower oil. Meal planning  Eat a balanced diet that includes: 4 or more servings of fruits and 4 or more servings of vegetables each day. Try to fill one-half of your plate with fruits and vegetables. 6-8 servings of whole grains each day. Less than 6 oz (170 g) of lean meat, poultry, or fish each day. A 3-oz (85-g) serving of meat is about the same size as a deck of cards. One egg equals 1 oz (28 g). 2-3 servings of low-fat dairy each day. One serving is 1 cup (237 mL). 1 serving of nuts, seeds, or beans 5 times each week. 2-3 servings of heart-healthy fats. Healthy fats called omega-3 fatty acids are found in foods such  as walnuts, flaxseeds, fortified milks, and eggs. These fats are also found in cold-water fish, such as sardines, salmon, and mackerel. Limit how much you eat of: Canned or prepackaged foods. Food that is high in trans fat, such as some fried foods. Food that is high in saturated fat, such as fatty meat. Desserts and other sweets, sugary drinks, and other foods with added sugar. Full-fat dairy products. Do not salt foods before eating. Do not eat more than 4 egg yolks a week. Try to eat at least 2 vegetarian meals a week. Eat more home-cooked food and less restaurant, buffet, and fast food. Lifestyle When eating at a restaurant, ask that your food be prepared with less salt or no salt, if possible. If you drink alcohol: Limit how much you use to: 0-1 drink a day for women who are not pregnant. 0-2 drinks a day for men. Be aware of how much alcohol is in your drink. In the [place] one drink equals one 12 oz bottle of beer (355 mL), one 5 oz glass of wine (148 mL), or one 1 oz glass of hard liquor (44 mL). General  information Avoid eating more than 2,300 mg of salt a day. If you have hypertension, you may need to reduce your sodium intake to 1,500 mg a day. Work with your health care provider to maintain a healthy body weight or to lose weight. Ask what an ideal weight is for you. Get at least 30 minutes of exercise that causes your heart to beat faster (aerobic exercise) most days of the week. Activities may include walking, swimming, or biking. Work with your health care provider or dietitian to adjust your eating plan to your individual calorie needs. What foods should I eat? Fruits All fresh, dried, or frozen fruit. Canned fruit in natural juice (without added sugar). Vegetables Fresh or frozen vegetables (raw, steamed, roasted, or grilled). Low-sodium or reduced-sodium tomato and vegetable juice. Low-sodium or reduced-sodium tomato sauce and tomato paste. Low-sodium or reduced-sodium  canned vegetables. Grains Whole-grain or whole-wheat bread. Whole-grain or whole-wheat pasta. Brown rice. [place]. Bulgur. Whole-grain and low-sodium cereals. Pita bread. Low-fat, low-sodium crackers. Whole-wheat flour tortillas. Meats and other proteins Skinless chicken or [place]. Ground chicken or [place]. Pork with fat trimmed off. Fish and seafood. Egg whites. Dried beans, peas, or lentils. Unsalted nuts, nut butters, and seeds. Unsalted canned beans. Lean cuts of beef with fat trimmed off. Low-sodium, lean precooked or cured meat, such as sausages or meat loaves. Dairy Low-fat (1%) or fat-free (skim) milk. Reduced-fat, low-fat, or fat-free cheeses. Nonfat, low-sodium ricotta or cottage cheese. Low-fat or nonfat yogurt. Low-fat, low-sodium cheese. Fats and oils Soft margarine without trans fats. Vegetable oil. Reduced-fat, low-fat, or light mayonnaise and salad dressings (reduced-sodium). Canola, safflower, olive, avocado, soybean, and sunflower oils. Avocado. Seasonings and condiments Herbs. Spices. Seasoning mixes without salt. Other foods Unsalted popcorn and pretzels. Fat-free sweets. The items listed above may not be a complete list of foods and beverages you can eat. Contact a dietitian for more information. What foods should I avoid? Fruits Canned fruit in a light or heavy syrup. Fried fruit. Fruit in cream or butter sauce. Vegetables Creamed or fried vegetables. Vegetables in a cheese sauce. Regular canned vegetables (not low-sodium or reduced-sodium). Regular canned tomato sauce and paste (not low-sodium or reduced-sodium). Regular tomato and vegetable juice (not low-sodium or reduced-sodium). Angie Fava. Olives. Grains Baked goods made with fat, such as croissants, muffins, or some breads. Dry pasta or rice meal packs. Meats and other proteins Fatty cuts of meat. Ribs. Fried meat. Berniece Salines. Bologna, salami, and other precooked or cured meats, such as sausages or meat loaves. Fat  from the back of a pig (fatback). Bratwurst. Salted nuts and seeds. Canned beans with added salt. Canned or smoked fish. Whole eggs or egg yolks. Chicken or [place] with skin. Dairy Whole or 2% milk, cream, and half-and-half. Whole or full-fat cream cheese. Whole-fat or sweetened yogurt. Full-fat cheese. Nondairy creamers. Whipped toppings. Processed cheese and cheese spreads. Fats and oils Butter. Stick margarine. Lard. Shortening. Ghee. Bacon fat. Tropical oils, such as coconut, palm kernel, or palm oil. Seasonings and condiments Onion salt, garlic salt, seasoned salt, table salt, and sea salt. Worcestershire sauce. Tartar sauce. Barbecue sauce. Teriyaki sauce. Soy sauce, including reduced-sodium. Steak sauce. Canned and packaged gravies. Fish sauce. Oyster sauce. Cocktail sauce. Store-bought horseradish. Ketchup. Mustard. Meat flavorings and tenderizers. Bouillon cubes. Hot sauces. Pre-made or packaged marinades. Pre-made or packaged taco seasonings. Relishes. Regular salad dressings. Other foods Salted popcorn and pretzels. The items listed above may not be a complete list of foods and beverages you should avoid. Contact a dietitian  for more information. Where to find more information National Heart, Lung, and Blood Institute: https://wilson-eaton.com/ American Heart Association: www.heart.org Academy of Nutrition and Dietetics: www.eatright.[place]: www.kidney.org Summary The DASH eating plan is a healthy eating plan that has been shown to reduce high blood pressure (hypertension). It may also reduce your risk for type 2 diabetes, heart disease, and stroke. When on the DASH eating plan, aim to eat more fresh fruits and vegetables, whole grains, lean proteins, low-fat dairy, and heart-healthy fats. With the DASH eating plan, you should limit salt (sodium) intake to 2,300 mg a day. If you have hypertension, you may need to reduce your sodium intake to 1,500 mg a day. Work with  your health care provider or dietitian to adjust your eating plan to your individual calorie needs. This information is not intended to replace advice given to you by your health care provider. Make sure you discuss any questions you have with your health care provider. Document Revised: 06/29/2019 Document Reviewed: 06/29/2019 Elsevier Patient Education  [place].

## 2022-06-23 NOTE — Addendum Note (Signed)
Addended by: Fredderick Severance on: 06/23/2022 09:42 AM   Modules accepted: Orders

## 2022-07-07 ENCOUNTER — Ambulatory Visit (INDEPENDENT_AMBULATORY_CARE_PROVIDER_SITE_OTHER): Payer: Medicare Other | Admitting: Family Medicine

## 2022-07-07 ENCOUNTER — Encounter: Payer: Self-pay | Admitting: Family Medicine

## 2022-07-07 VITALS — BP 148/72 | HR 72 | Ht 70.0 in | Wt 182.0 lb

## 2022-07-07 DIAGNOSIS — M47812 Spondylosis without myelopathy or radiculopathy, cervical region: Secondary | ICD-10-CM

## 2022-07-07 MED ORDER — BACLOFEN 5 MG PO TABS: 5.0000 mg | ORAL_TABLET | Freq: Three times a day (TID) | ORAL | 0 refills | Status: DC | PRN

## 2022-07-07 NOTE — Progress Notes (Signed)
     Primary Care / Sports Medicine Office Visit  Patient Information:  Patient ID: Jeff Mcmahon, male DOB: October 16, 1945 Age: 76 y.o. MRN: [id number]   Jeff Mcmahon is a pleasant 76 y.o. male presenting with the following:  Chief Complaint  Patient presents with   Torticollis    Has had stiff neck for 9 months, states PCP had him start tylenol morning and night and states it isn't as stiff    Vitals:   07/07/22 1045 07/07/22 1047  BP: (!) 150/82 (!) 148/72  Pulse: 72   SpO2: 97%    Vitals:   07/07/22 1045  Weight: 182 lb (82.6 kg)  Height: '5\' 10"'$  (1.778 m)   Body mass index is 26.11 kg/m.     Independent interpretation of notes and tests performed by another provider:   Independent interpretation of cervical spine x-rays dated 06/23/2022 reveals loss of the expected cervical lordosis, intervertebral narrowing at C4-5, C5-6, anterior endplate osteophytes noted at these levels, most prominently at C4 inferiorly and C5 superiorly, facet arthropathy noted primarily at these levels, significant foraminal narrowing noted at the right greater than left at these levels, no acute osseous processes identified  Procedures performed:   None  Pertinent History, Exam, Impression, and Recommendations:   Problem List Items Addressed This Visit       Musculoskeletal and Integument   Osteoarthritis - Primary    Acute on chronic condition, neck stiffness. Examination without radicular features, limited ROM and muscular spasm. Advised topical NSAID, supportive care, and home exercises. 6 week return planned, suboptimal response to be addressed with cortisone trigger point injections, advanced imaging, formal PT considerations.      Relevant Medications   Baclofen 5 MG TABS     Orders & Medications Meds ordered this encounter  Medications   Baclofen 5 MG TABS    Sig: Take 5 mg by mouth 3 (three) times daily as needed (muscle spasm).    Dispense:  90 tablet    Refill:  0   No  orders of the defined types were placed in this encounter.    Return in about 6 weeks (around 08/18/2022).     [name], MD   Primary Care Sports [facility] [place]

## 2022-07-07 NOTE — Patient Instructions (Signed)
-   Continue twice daily Tylenol - Use topical diclofenac 1% (Voltaren gel) twice daily scheduled x1 week then as needed for pain control - Can use muscle relaxer baclofen 3 times a day as needed for muscle tightness pain, side effect can be drowsiness - Additional pain control with ice, heat, Bengay - Start and gently advance home exercises with information provided - Return for follow-up in 6 weeks, contact for any questions between now and then

## 2022-07-13 DIAGNOSIS — Z23 Encounter for immunization: Secondary | ICD-10-CM | POA: Diagnosis not present

## 2022-07-14 NOTE — Assessment & Plan Note (Addendum)
Acute on chronic condition, neck stiffness. Examination without radicular features, limited ROM and muscular spasm. Advised topical NSAID, supportive care, and home exercises. 6 week return planned, suboptimal response to be addressed with cortisone trigger point injections, advanced imaging, formal PT considerations.

## 2022-07-21 DIAGNOSIS — G479 Sleep disorder, unspecified: Secondary | ICD-10-CM | POA: Diagnosis not present

## 2022-07-21 DIAGNOSIS — G3184 Mild cognitive impairment, so stated: Secondary | ICD-10-CM | POA: Diagnosis not present

## 2022-07-31 ENCOUNTER — Other Ambulatory Visit: Payer: Self-pay | Admitting: Family Medicine

## 2022-07-31 DIAGNOSIS — M47812 Spondylosis without myelopathy or radiculopathy, cervical region: Secondary | ICD-10-CM

## 2022-08-11 ENCOUNTER — Encounter: Payer: Self-pay | Admitting: Family Medicine

## 2022-08-11 ENCOUNTER — Ambulatory Visit (INDEPENDENT_AMBULATORY_CARE_PROVIDER_SITE_OTHER): Payer: Medicare Other | Admitting: Family Medicine

## 2022-08-11 VITALS — BP 124/62 | HR 75 | Ht 70.0 in | Wt 186.0 lb

## 2022-08-11 DIAGNOSIS — R030 Elevated blood-pressure reading, without diagnosis of hypertension: Secondary | ICD-10-CM | POA: Diagnosis not present

## 2022-08-11 DIAGNOSIS — R351 Nocturia: Secondary | ICD-10-CM | POA: Diagnosis not present

## 2022-08-11 DIAGNOSIS — R9431 Abnormal electrocardiogram [ECG] [EKG]: Secondary | ICD-10-CM

## 2022-08-11 DIAGNOSIS — R7303 Prediabetes: Secondary | ICD-10-CM | POA: Diagnosis not present

## 2022-08-11 DIAGNOSIS — E785 Hyperlipidemia, unspecified: Secondary | ICD-10-CM | POA: Diagnosis not present

## 2022-08-11 LAB — HEMOCCULT GUIAC POC 1CARD (OFFICE): Fecal Occult Blood, POC: NEGATIVE

## 2022-08-11 NOTE — Patient Instructions (Signed)

## 2022-08-11 NOTE — Progress Notes (Signed)
Date:  08/11/2022   Name:  Jeff Mcmahon   DOB:  February 02, 1946   MRN:  [id number]   Chief Complaint: Hypertension  Hypertension This is a chronic problem. The current episode started more than 1 year ago. The problem has been gradually improving since onset. Pertinent negatives include no anxiety, blurred vision, chest pain, headaches, malaise/fatigue, neck pain, orthopnea, palpitations, peripheral edema, PND, shortness of breath or sweats. Past treatments include lifestyle changes. The current treatment provides moderate improvement. There are no compliance problems.  There is no history of angina, kidney disease, CAD/MI, CVA, heart failure, left ventricular hypertrophy, PVD or retinopathy. There is no history of chronic renal disease, a hypertension causing med or renovascular disease.  Hyperlipidemia He has no history of chronic renal disease. Pertinent negatives include no chest pain or shortness of breath. Current antihyperlipidemic treatment includes diet change. The current treatment provides moderate improvement of lipids.    Lab Results  Component Value Date   NA 138 12/21/2021   K 4.7 12/21/2021   CO2 27 12/21/2021   GLUCOSE 89 12/21/2021   BUN 14 12/21/2021   CREATININE 0.77 12/21/2021   CALCIUM 9.3 12/21/2021   EGFR 94 12/21/2021   GFRNONAA 84 05/16/2019   Lab Results  Component Value Date   CHOL 169 12/21/2021   HDL 41 12/21/2021   LDLCALC 114 (H) 12/21/2021   TRIG 74 12/21/2021   CHOLHDL 3.9 12/31/2019   Lab Results  Component Value Date   TSH 2.430 07/22/2017   Lab Results  Component Value Date   HGBA1C 5.4 12/21/2021   Lab Results  Component Value Date   WBC 5.3 07/22/2017   HGB 16.0 07/22/2017   HCT 47.7 07/22/2017   MCV 96 07/22/2017   PLT 236 07/22/2017   Lab Results  Component Value Date   ALT 22 07/25/2018   AST 20 07/25/2018   ALKPHOS 103 07/25/2018   BILITOT 0.6 07/25/2018   No results found for: "25OHVITD2", "25OHVITD3", "VD25OH"    Review of Systems  Constitutional:  Negative for chills, diaphoresis, fever, malaise/fatigue and unexpected weight change.  HENT:  Negative for trouble swallowing.   Eyes:  Negative for blurred vision.  Respiratory:  Negative for chest tightness, shortness of breath and wheezing.   Cardiovascular:  Negative for chest pain, palpitations, orthopnea and PND.  Gastrointestinal:  Negative for blood in stool.  Genitourinary:  Negative for difficulty urinating, dysuria, hematuria and urgency.  Musculoskeletal:  Negative for neck pain.  Neurological:  Negative for headaches.    Patient Active Problem List   Diagnosis Date Noted   Retinal tear of right eye 12/21/2021   Dysphagia    Osteoarthritis 10/02/2015   Prediabetes 09/09/2015   Personal history of colonic polyps 09/05/2015   Hyperlipidemia 09/04/2015   GERD (gastroesophageal reflux disease) 09/04/2015   Dry eyes 09/04/2015    No Known Allergies  Past Surgical History:  Procedure Laterality Date   COLONOSCOPY     COLONOSCOPY WITH PROPOFOL N/A 06/02/2020   Procedure: COLONOSCOPY WITH PROPOFOL;  Surgeon: Lin Landsman, MD;  Location: [place];  Service: Gastroenterology;  Laterality: N/A;   ESOPHAGOGASTRODUODENOSCOPY     ESOPHAGOGASTRODUODENOSCOPY (EGD) WITH PROPOFOL N/A 09/02/2020   Procedure: ESOPHAGOGASTRODUODENOSCOPY (EGD) WITH PROPOFOL;  Surgeon: Lin Landsman, MD;  Location: [place];  Service: Gastroenterology;  Laterality: N/A;   EXCISION MASS NECK N/A 09/07/2016   Procedure: EXCISION MASS NECK;  Surgeon: Florene Glen, MD;  Location: [facility];  Service: General;  Laterality: N/A;  EXCISION OF BACK LESION N/A 09/07/2016   Procedure: EXCISION OF BACK LESION;  Surgeon: Florene Glen, MD;  Location: [facility];  Service: General;  Laterality: N/A;   MOUTH SURGERY     dental procedure   RETINAL LASER PROCEDURE Right 04/06/2021   [facility] [place] History    Tobacco Use   Smoking status: Never   Smokeless tobacco: Never   Tobacco comments:    smoking cessation materials not required  Vaping Use   Vaping Use: Never used  Substance Use Topics   Alcohol use: Yes    Comment: social   Drug use: No     Medication list has been reviewed and updated.  Current Meds  Medication Sig   acetaminophen (TYLENOL) 500 MG tablet Take 500 mg by mouth every 6 (six) hours as needed.   Baclofen 5 MG TABS Take 5 mg by mouth 3 (three) times daily as needed (muscle spasm).   diclofenac Sodium (VOLTAREN) 1 % GEL Apply topically 4 (four) times daily.   famotidine (PEPCID) 40 MG tablet Take 1 tablet (40 mg total) by mouth daily.   Misc Natural Products (GLUCOSAMINE CHONDROITIN ADV) TABS Take by mouth.   Omega-3 Fatty Acids (FISH OIL) 1000 MG CAPS Take 1 capsule (1,000 mg total) by mouth daily.   Polyethyl Glycol-Propyl Glycol (SYSTANE) 0.4-0.3 % SOLN Apply to eye.   psyllium (METAMUCIL) 58.6 % powder Take 1 packet by mouth 3 (three) times daily.   sodium chloride (OCEAN) 0.65 % SOLN nasal spray Place 1 spray into both nostrils as needed for congestion.    triamcinolone (NASACORT) 55 MCG/ACT AERO nasal inhaler Place 2 sprays into the nose daily. Dr. Kathyrn Sheriff       08/11/2022    9:36 AM 06/23/2022    8:07 AM 03/23/2022    4:19 PM 06/22/2021    8:50 AM  GAD 7 : Generalized Anxiety Score  Nervous, Anxious, on Edge 0 0 0 0  Control/stop worrying 0 0 0 0  Worry too much - different things 0 0 0 0  Trouble relaxing 0 0 0 0  Restless 0 0 0 0  Easily annoyed or irritable 0 0 0 0  Afraid - awful might happen 0 0 0 0  Total GAD 7 Score 0 0 0 0  Anxiety Difficulty Not difficult at all Not difficult at all Not difficult at all Not difficult at all       08/11/2022    9:35 AM 06/23/2022    8:07 AM 04/14/2022   10:09 AM  Depression screen PHQ 2/9  Decreased Interest 0 0 0  Down, Depressed, Hopeless 0 0 0  PHQ - 2 Score 0 0 0  Altered sleeping 0 0 0  Tired,  decreased energy 0 0 0  Change in appetite 0 0 0  Feeling bad or failure about yourself  0 0 0  Trouble concentrating 0 0 0  Moving slowly or fidgety/restless 0 0 0  Suicidal thoughts 0 0 0  PHQ-9 Score 0 0 0  Difficult doing work/chores Not difficult at all Not difficult at all Not difficult at all    BP Readings from Last 3 Encounters:  08/11/22 124/62  07/07/22 (!) 148/72  06/23/22 138/80    Physical Exam Vitals reviewed.  HENT:     Head: Normocephalic.     Right Ear: Tympanic membrane, ear canal and external ear normal.     Left Ear: Tympanic membrane, ear canal  and external ear normal.     Nose: Nose normal.     Mouth/Throat:     Mouth: Mucous membranes are moist.  Eyes:     Pupils: Pupils are equal, round, and reactive to light.  Cardiovascular:     Rate and Rhythm: Normal rate and regular rhythm.     Pulses: Normal pulses.     Heart sounds: Normal heart sounds. No murmur heard.    No friction rub. No gallop.  Pulmonary:     Effort: No respiratory distress.     Breath sounds: No stridor. No wheezing, rhonchi or rales.  Abdominal:     Tenderness: There is no guarding or rebound.  Genitourinary:    Prostate: Normal.     Rectum: Normal. Guaiac result negative.  Neurological:     Mental Status: He is alert.     Wt Readings from Last 3 Encounters:  08/11/22 186 lb (84.4 kg)  07/07/22 182 lb (82.6 kg)  06/23/22 182 lb (82.6 kg)    BP 124/62   Pulse 75   Ht _0  (1.778 m)   Wt 186 lb (84.4 kg)   SpO2 97%   BMI 26.69 kg/m   Assessment and Plan:  1. Hyperlipidemia, unspecified hyperlipidemia type Chronic.  Controlled.  Stable.  Currently with control of dietary approach.  Will check lipid panel for current level of control. - Lipid Panel With LDL/HDL Ratio  2. Elevated blood pressure reading without diagnosis of hypertension Patient's had some elevated readings with self regulated readings at home.  Pressures have been in the 128/78 range.  I have  assured patient that this is acceptable given the variability of blood pressure we will check renal function panel to evaluate for GFR. - Renal Function Panel - POCT Occult Blood Stool  3. Prediabetes 1 A1c in the distant past was elevated and we will check an A1c to see if this is currently stable. - Hemoglobin A1c  4. Abnormal EKG In 2022 patient had some discomfort in the chest after a fall for which he had an EKG done.  There was some question of some anterior septal abnormality on reading however with review by cardiologist afterwards this was deemed to be unremarkable.  Patient would like to have this at least repeated with an EKG with evaluation and EKG was done with following results: Rate 66 sinus rhythm intervals normal.  No LVH criteria noted by voltage.  No ischemic changes noted concerning such as Q waves, ST-T wave changes or delay in R wave progression.  This is been read as a normal EKG and patient is aware of this reading at this time.  We will continue to watch and if there is any development of chest discomfort, palpitations, or other cardiac concerns he will return and we can involve cardiology. - EKG 12-Lead  5. Nocturia Patient with occasional nocturia.  DRE exam notes normal size prostate with normal consistency no tenderness or nodularity.  Will check PSA for current status. - PSA - Renal Function Panel    Otilio Miu, MD

## 2022-08-12 DIAGNOSIS — E785 Hyperlipidemia, unspecified: Secondary | ICD-10-CM | POA: Diagnosis not present

## 2022-08-12 DIAGNOSIS — R030 Elevated blood-pressure reading, without diagnosis of hypertension: Secondary | ICD-10-CM | POA: Diagnosis not present

## 2022-08-12 DIAGNOSIS — R351 Nocturia: Secondary | ICD-10-CM | POA: Diagnosis not present

## 2022-08-12 DIAGNOSIS — R7303 Prediabetes: Secondary | ICD-10-CM | POA: Diagnosis not present

## 2022-08-13 LAB — RENAL FUNCTION PANEL
Albumin: 4.5 g/dL (ref 3.8–4.8)
BUN/Creatinine Ratio: 14 (ref 10–24)
BUN: 12 mg/dL (ref 8–27)
CO2: 24 mmol/L (ref 20–29)
Calcium: 9.2 mg/dL (ref 8.6–10.2)
Chloride: 101 mmol/L (ref 96–106)
Creatinine, Ser: 0.87 mg/dL (ref 0.76–1.27)
Glucose: 94 mg/dL (ref 70–99)
Phosphorus: 3.5 mg/dL (ref 2.8–4.1)
Potassium: 4.4 mmol/L (ref 3.5–5.2)
Sodium: 142 mmol/L (ref 134–144)
eGFR: 90 mL/min/{1.73_m2} (ref >59)

## 2022-08-13 LAB — LIPID PANEL WITH LDL/HDL RATIO
Cholesterol, Total: 191 mg/dL (ref 100–199)
HDL: 46 mg/dL (ref >39)
LDL Chol Calc (NIH): 128 mg/dL — ABNORMAL HIGH (ref 0–99)
LDL/HDL Ratio: 2.8 ratio (ref 0.0–3.6)
Triglycerides: 91 mg/dL (ref 0–149)
VLDL Cholesterol Cal: 17 mg/dL (ref 5–40)

## 2022-08-13 LAB — PSA: Prostate Specific Ag, Serum: 0.8 ng/mL (ref 0.0–4.0)

## 2022-08-13 LAB — HEMOGLOBIN A1C
Est. average glucose Bld gHb Est-mCnc: 108 mg/dL
Hgb A1c MFr Bld: 5.4 % (ref 4.8–5.6)

## 2022-08-18 ENCOUNTER — Ambulatory Visit (INDEPENDENT_AMBULATORY_CARE_PROVIDER_SITE_OTHER): Payer: Medicare Other | Admitting: Family Medicine

## 2022-08-18 ENCOUNTER — Encounter: Payer: Self-pay | Admitting: Family Medicine

## 2022-08-18 ENCOUNTER — Ambulatory Visit: Payer: Medicare Other | Admitting: Family Medicine

## 2022-08-18 VITALS — BP 122/76 | HR 70 | Ht 70.0 in | Wt 186.0 lb

## 2022-08-18 DIAGNOSIS — M4722 Other spondylosis with radiculopathy, cervical region: Secondary | ICD-10-CM | POA: Diagnosis not present

## 2022-08-18 MED ORDER — GABAPENTIN 100 MG PO CAPS: 100.0000 mg | ORAL_CAPSULE | Freq: Every day | ORAL | 0 refills | Status: DC

## 2022-08-18 NOTE — Patient Instructions (Signed)
-   Take gabapentin nightly - Continue diclofenac twice daily (AM and PM) and 2 additional doses as-needed - Baclofen can be taken as-needed - Advance home exercises as symptoms allow - Return in 6 weeks.

## 2022-08-18 NOTE — Progress Notes (Signed)
     Primary Care / Sports Medicine Office Visit  Patient Information:  Patient ID: Jeff Mcmahon, male DOB: 1946-08-02 Age: 77 y.o. MRN: [id number]   Jeff Mcmahon is a pleasant 77 y.o. male presenting with the following:  Chief Complaint  Patient presents with   pondylosis of cervical region without myelopathy or radicul    Pt states he still have some stiffness at times but things he is getting better    Vitals:   08/18/22 0853  BP: 122/76  Pulse: 70  SpO2: 97%   Vitals:   08/18/22 0853  Weight: 186 lb (84.4 kg)  Height: '5\' 10"'$  (1.778 m)   Body mass index is 26.69 kg/m.  No results found.   Independent interpretation of notes and tests performed by another provider:   None  Procedures performed:   None  Pertinent History, Exam, Impression, and Recommendations:   Jeff Mcmahon was seen today for pondylosis of cervical region without myelopathy or radicul.  Cervical spondylosis with radiculopathy Assessment & Plan: Chronic, improved over interval visit with increased range of motion, denies paresthesias. No issues with ADLs, mostly noted to left paraspinal cervical spine region with extent of ROM and athletics (throwing baseball with son). Has been compliant with medication regimen without issue.  Examination demonstrates increased ROM with pain noting during maximal torsion and extension, localizes to left SCM and left upper trapezius. He has a positive Spurling's on the right that localizes bilaterally, otherwise symmetric sensorimotor function in the upper extremities. He has tenderness to the left upper trapezius, levator scapula, and somewhat to the SCM.   Discussed interim progress, will advance with gabapentin 100 mg nightly and continue other regimen. Follow-up scheduled.  Orders: -     Gabapentin; Take 1 capsule (100 mg total) by mouth at bedtime.  Dispense: 45 capsule; Refill: 0     Orders & Medications Meds ordered this encounter  Medications   gabapentin  (NEURONTIN) 100 MG capsule    Sig: Take 1 capsule (100 mg total) by mouth at bedtime.    Dispense:  45 capsule    Refill:  0   No orders of the defined types were placed in this encounter.    Return in about 6 weeks (around 09/29/2022).     [name], MD, [facility]   Primary Care Sports Medicine Primary Care and Sports Medicine at [facility]

## 2022-08-18 NOTE — Assessment & Plan Note (Signed)
Chronic, improved over interval visit with increased range of motion, denies paresthesias. No issues with ADLs, mostly noted to left paraspinal cervical spine region with extent of ROM and athletics (throwing baseball with son). Has been compliant with medication regimen without issue.  Examination demonstrates increased ROM with pain noting during maximal torsion and extension, localizes to left SCM and left upper trapezius. He has a positive Spurling's on the right that localizes bilaterally, otherwise symmetric sensorimotor function in the upper extremities. He has tenderness to the left upper trapezius, levator scapula, and somewhat to the SCM.   Discussed interim progress, will advance with gabapentin 100 mg nightly and continue other regimen. Follow-up scheduled.

## 2022-09-29 ENCOUNTER — Ambulatory Visit (INDEPENDENT_AMBULATORY_CARE_PROVIDER_SITE_OTHER): Payer: Medicare Other | Admitting: Family Medicine

## 2022-09-29 ENCOUNTER — Encounter: Payer: Self-pay | Admitting: Family Medicine

## 2022-09-29 VITALS — BP 120/80 | HR 82 | Ht 70.0 in | Wt 187.0 lb

## 2022-09-29 DIAGNOSIS — M4722 Other spondylosis with radiculopathy, cervical region: Secondary | ICD-10-CM

## 2022-09-29 NOTE — Assessment & Plan Note (Signed)
While patient continues to note benefit from gabapentin, he has noted grogginess lasting until midday following nightly dosing.  He has been consistent with neck exercises and as needed topical Voltaren usage.  Also mentions some right hamstring symptoms with long car rides.  Long car rides also aggravate neck, at times to the point of numbness in the bilateral upper extremities.  In addition to discussing the axial load component of riding in the car, we discussed possible alternatives to gabapentin inclusive of Lyrica and/or Cymbalta, he will review these and contact us if wanting to start them in the future.  For a medication management standpoint I have advised him to discontinue gabapentin.  Lastly, [employer] spine conditioning exercises have been provided for patient to incorporate with his current regimen.  He can contact us if wanting start any medications and otherwise follow-up as needed.

## 2022-09-29 NOTE — Progress Notes (Signed)
     Primary Care / Sports Medicine Office Visit  Patient Information:  Patient ID: Jeff Mcmahon, male DOB: 1945-11-26 Age: 77 y.o. MRN: [id number]   Jeff Mcmahon is a pleasant 77 y.o. male presenting with the following:  Chief Complaint  Patient presents with   Cervical spondylosis with radiculopathy    Little stiff but is feeling better, gabapentin is making him groggy and would like to stop that    Vitals:   09/29/22 0904  BP: 120/80  Pulse: 82  SpO2: 97%   Vitals:   09/29/22 0904  Weight: 187 lb (84.8 kg)  Height: 5' 10"$  (1.778 m)   Body mass index is 26.83 kg/m.  No results found.   Independent interpretation of notes and tests performed by another provider:   None  Procedures performed:   None  Pertinent History, Exam, Impression, and Recommendations:   Frazer was seen today for cervical spondylosis with radiculopathy.  Cervical spondylosis with radiculopathy Assessment & Plan: While patient continues to note benefit from gabapentin, he has noted grogginess lasting until midday following nightly dosing.  He has been consistent with neck exercises and as needed topical Voltaren usage.  Also mentions some right hamstring symptoms with long car rides.  Long car rides also aggravate neck, at times to the point of numbness in the bilateral upper extremities.  In addition to discussing the axial load component of riding in the car, we discussed possible alternatives to gabapentin inclusive of Lyrica and/or Cymbalta, he will review these and contact us if wanting to start them in the future.  For a medication management standpoint I have advised him to discontinue gabapentin.  Lastly, [employer] spine conditioning exercises have been provided for patient to incorporate with his current regimen.  He can contact us if wanting start any medications and otherwise follow-up as needed.      Orders & Medications No orders of the defined types were placed in this  encounter.  No orders of the defined types were placed in this encounter.    Return if symptoms worsen or fail to improve.     [name], MD, [facility]   Primary Care Sports Medicine Primary Care and Sports Medicine at [facility]

## 2022-09-29 NOTE — Patient Instructions (Addendum)
-   Discontinue gabapentin  - Start [employer] spine conditioning program exercises in conjunction with your previous neck exercises [email or web address]  - Can consider the following medications for symptom control in the future (contact us if wanting to start) Lyrica (pregabalin) Cymbalta (duloxetine) for chronic musculoskeletal pain  - Contact us for questions and follow-up as needed

## 2022-11-17 DIAGNOSIS — Z23 Encounter for immunization: Secondary | ICD-10-CM | POA: Diagnosis not present

## 2022-12-20 DIAGNOSIS — H52223 Regular astigmatism, bilateral: Secondary | ICD-10-CM | POA: Diagnosis not present

## 2022-12-20 DIAGNOSIS — H04122 Dry eye syndrome of left lacrimal gland: Secondary | ICD-10-CM | POA: Diagnosis not present

## 2022-12-20 DIAGNOSIS — H5203 Hypermetropia, bilateral: Secondary | ICD-10-CM | POA: Diagnosis not present

## 2022-12-20 DIAGNOSIS — H43811 Vitreous degeneration, right eye: Secondary | ICD-10-CM | POA: Diagnosis not present

## 2022-12-20 DIAGNOSIS — H2513 Age-related nuclear cataract, bilateral: Secondary | ICD-10-CM | POA: Diagnosis not present

## 2022-12-20 DIAGNOSIS — H524 Presbyopia: Secondary | ICD-10-CM | POA: Diagnosis not present

## 2022-12-22 ENCOUNTER — Encounter: Payer: Self-pay | Admitting: Family Medicine

## 2022-12-22 ENCOUNTER — Ambulatory Visit (INDEPENDENT_AMBULATORY_CARE_PROVIDER_SITE_OTHER): Payer: Medicare Other | Admitting: Family Medicine

## 2022-12-22 VITALS — BP 116/64 | HR 72 | Ht 70.0 in | Wt 186.0 lb

## 2022-12-22 DIAGNOSIS — K2100 Gastro-esophageal reflux disease with esophagitis, without bleeding: Secondary | ICD-10-CM

## 2022-12-22 DIAGNOSIS — E785 Hyperlipidemia, unspecified: Secondary | ICD-10-CM | POA: Diagnosis not present

## 2022-12-22 DIAGNOSIS — Z8711 Personal history of peptic ulcer disease: Secondary | ICD-10-CM | POA: Diagnosis not present

## 2022-12-22 MED ORDER — FAMOTIDINE 40 MG PO TABS: 40.0000 mg | ORAL_TABLET | Freq: Every day | ORAL | 1 refills | Status: AC

## 2022-12-22 NOTE — Progress Notes (Signed)
Date:  12/22/2022   Name:  Jeff Mcmahon   DOB:  10/23/1945   MRN:  [id number]   Chief Complaint: Gastroesophageal Reflux and Hyperlipidemia (Not on meds)  Gastroesophageal Reflux He reports no abdominal pain, no chest pain, no dysphagia, no early satiety, no heartburn, no nausea or no wheezing. This is a chronic problem. The current episode started more than 1 year ago. The problem occurs occasionally. The problem has been gradually improving. The symptoms are aggravated by certain foods. Pertinent negatives include no anemia, fatigue, melena or weight loss. He has tried a histamine-2 antagonist for the symptoms. The treatment provided moderate relief.  Hyperlipidemia This is a chronic problem. The current episode started more than 1 year ago. The problem is controlled. Recent lipid tests were reviewed and are normal. He has no history of diabetes or obesity. Pertinent negatives include no chest pain or shortness of breath. Current antihyperlipidemic treatment includes statins. The current treatment provides moderate improvement of lipids. There are no compliance problems.     Lab Results  Component Value Date   NA 142 08/12/2022   K 4.4 08/12/2022   CO2 24 08/12/2022   GLUCOSE 94 08/12/2022   BUN 12 08/12/2022   CREATININE 0.87 08/12/2022   CALCIUM 9.2 08/12/2022   EGFR 90 08/12/2022   GFRNONAA 84 05/16/2019   Lab Results  Component Value Date   CHOL 191 08/12/2022   HDL 46 08/12/2022   LDLCALC 128 (H) 08/12/2022   TRIG 91 08/12/2022   CHOLHDL 3.9 12/31/2019   Lab Results  Component Value Date   TSH 2.430 07/22/2017   Lab Results  Component Value Date   HGBA1C 5.4 08/12/2022   Lab Results  Component Value Date   WBC 5.3 07/22/2017   HGB 16.0 07/22/2017   HCT 47.7 07/22/2017   MCV 96 07/22/2017   PLT 236 07/22/2017   Lab Results  Component Value Date   ALT 22 07/25/2018   AST 20 07/25/2018   ALKPHOS 103 07/25/2018   BILITOT 0.6 07/25/2018   No results  found for: "25OHVITD2", "25OHVITD3", "VD25OH"   Review of Systems  Constitutional:  Negative for diaphoresis, fatigue and weight loss.  HENT:  Negative for congestion.   Respiratory:  Negative for shortness of breath and wheezing.   Cardiovascular:  Negative for chest pain.  Gastrointestinal:  Negative for abdominal pain, blood in stool, dysphagia, heartburn, melena and nausea.  Genitourinary:  Negative for urgency.  Musculoskeletal:  Negative for arthralgias.  Neurological:  Negative for dizziness.  Hematological:  Negative for adenopathy.    Patient Active Problem List   Diagnosis Date Noted   Retinal tear of right eye 12/21/2021   Dysphagia    Cervical spondylosis with radiculopathy 10/02/2015   Prediabetes 09/09/2015   Personal history of colonic polyps 09/05/2015   Hyperlipidemia 09/04/2015   GERD (gastroesophageal reflux disease) 09/04/2015   Dry eyes 09/04/2015    No Known Allergies  Past Surgical History:  Procedure Laterality Date   COLONOSCOPY     COLONOSCOPY WITH PROPOFOL N/A 06/02/2020   Procedure: COLONOSCOPY WITH PROPOFOL;  Surgeon: Toney Reil, MD;  Location: [facility] ENDOSCOPY;  Service: Gastroenterology;  Laterality: N/A;   ESOPHAGOGASTRODUODENOSCOPY     ESOPHAGOGASTRODUODENOSCOPY (EGD) WITH PROPOFOL N/A 09/02/2020   Procedure: ESOPHAGOGASTRODUODENOSCOPY (EGD) WITH PROPOFOL;  Surgeon: Toney Reil, MD;  Location: [facility] ENDOSCOPY;  Service: Gastroenterology;  Laterality: N/A;   EXCISION MASS NECK N/A 09/07/2016   Procedure: EXCISION MASS NECK;  Surgeon: Lattie Haw,  MD;  Location: [facility] ORS;  Service: General;  Laterality: N/A;   EXCISION OF BACK LESION N/A 09/07/2016   Procedure: EXCISION OF BACK LESION;  Surgeon: Lattie Haw, MD;  Location: [facility] ORS;  Service: General;  Laterality: N/A;   MOUTH SURGERY     dental procedure   RETINAL LASER PROCEDURE Right 04/06/2021   [facility] in [place]    Social History    Tobacco Use   Smoking status: Never   Smokeless tobacco: Never   Tobacco comments:    smoking cessation materials not required  Vaping Use   Vaping Use: Never used  Substance Use Topics   Alcohol use: Yes    Comment: social   Drug use: No     Medication list has been reviewed and updated.  Current Meds  Medication Sig   acetaminophen (TYLENOL) 500 MG tablet Take 500 mg by mouth every 6 (six) hours as needed.   diclofenac Sodium (VOLTAREN) 1 % GEL Apply topically 4 (four) times daily.   famotidine (PEPCID) 40 MG tablet Take 1 tablet (40 mg total) by mouth daily.   Misc Natural Products (GLUCOSAMINE CHONDROITIN ADV) TABS Take by mouth.   Omega-3 Fatty Acids (FISH OIL) 1000 MG CAPS Take 1 capsule (1,000 mg total) by mouth daily.   Polyethyl Glycol-Propyl Glycol (SYSTANE) 0.4-0.3 % SOLN Apply to eye.   psyllium (METAMUCIL) 58.6 % powder Take 1 packet by mouth 3 (three) times daily.   sodium chloride (OCEAN) 0.65 % SOLN nasal spray Place 1 spray into both nostrils as needed for congestion.    triamcinolone (NASACORT) 55 MCG/ACT AERO nasal inhaler Place 2 sprays into the nose daily. Dr. Elenore Rota       12/22/2022    7:50 AM 08/11/2022    9:36 AM 06/23/2022    8:07 AM 03/23/2022    4:19 PM  GAD 7 : Generalized Anxiety Score  Nervous, Anxious, on Edge 0 0 0 0  Control/stop worrying 0 0 0 0  Worry too much - different things 0 0 0 0  Trouble relaxing 0 0 0 0  Restless 0 0 0 0  Easily annoyed or irritable 0 0 0 0  Afraid - awful might happen 0 0 0 0  Total GAD 7 Score 0 0 0 0  Anxiety Difficulty Not difficult at all Not difficult at all Not difficult at all Not difficult at all       12/22/2022    7:50 AM 08/11/2022    9:35 AM 06/23/2022    8:07 AM  Depression screen PHQ 2/9  Decreased Interest 0 0 0  Down, Depressed, Hopeless 0 0 0  PHQ - 2 Score 0 0 0  Altered sleeping 0 0 0  Tired, decreased energy 0 0 0  Change in appetite 0 0 0  Feeling bad or failure about yourself   0 0 0  Trouble concentrating 0 0 0  Moving slowly or fidgety/restless 0 0 0  Suicidal thoughts 0 0 0  PHQ-9 Score 0 0 0  Difficult doing work/chores Not difficult at all Not difficult at all Not difficult at all    BP Readings from Last 3 Encounters:  12/22/22 116/64  09/29/22 120/80  08/18/22 122/76    Physical Exam Vitals and nursing note reviewed.  HENT:     Head: Normocephalic.     Right Ear: Tympanic membrane and external ear normal.     Left Ear: Tympanic membrane and external ear normal.  Nose: Nose normal. No congestion or rhinorrhea.     Mouth/Throat:     Mouth: Mucous membranes are moist.     Pharynx: No oropharyngeal exudate or posterior oropharyngeal erythema.  Eyes:     General: No scleral icterus.       Right eye: No discharge.        Left eye: No discharge.     Conjunctiva/sclera: Conjunctivae normal.     Pupils: Pupils are equal, round, and reactive to light.  Neck:     Thyroid: No thyromegaly.     Vascular: No JVD.     Trachea: No tracheal deviation.  Cardiovascular:     Rate and Rhythm: Normal rate and regular rhythm.     Heart sounds: Normal heart sounds. No murmur heard.    No friction rub. No gallop.  Pulmonary:     Effort: No respiratory distress.     Breath sounds: Normal breath sounds. No wheezing, rhonchi or rales.  Chest:     Chest wall: No tenderness.  Abdominal:     General: Bowel sounds are normal.     Palpations: Abdomen is soft. There is no mass.     Tenderness: There is no abdominal tenderness. There is no guarding or rebound.  Musculoskeletal:        General: No tenderness. Normal range of motion.     Cervical back: Normal range of motion and neck supple.  Lymphadenopathy:     Cervical: No cervical adenopathy.  Skin:    General: Skin is warm.     Findings: No bruising or rash.  Neurological:     Mental Status: He is alert.     Deep Tendon Reflexes: Reflexes are normal and symmetric.     Wt Readings from Last 3  Encounters:  12/22/22 186 lb (84.4 kg)  09/29/22 187 lb (84.8 kg)  08/18/22 186 lb (84.4 kg)    BP 116/64   Pulse 72   Ht 5\' 10"  (1.778 m)   Wt 186 lb (84.4 kg)   SpO2 97%   BMI 26.69 kg/m   Assessment and Plan: 1. Gastroesophageal reflux disease with esophagitis without hemorrhage Chronic.  Controlled.  Stable.  Patient is doing well with regimen of famotidine 40 mg every morning and a over-the-counter 20 mg at night which controls symptomatology and has not had recurrence of problem #2. - famotidine (PEPCID) 40 MG tablet; Take 1 tablet (40 mg total) by mouth daily.  Dispense: 90 tablet; Refill: 1  2. History of gastric ulcer Patient with history of gastric ulcer which is controlled with famotidine at dosing 40 mg in the morning occasional 20 mg for breakthrough heartburn in the evening. - famotidine (PEPCID) 40 MG tablet; Take 1 tablet (40 mg total) by mouth daily.  Dispense: 90 tablet; Refill: 1  3. Hyperlipidemia, unspecified hyperlipidemia type Mild to moderate elevation of LDL in the past.  Patient has been trying to control with diet but is willing to go on atorvastatin 10 mg at only that dosing pending lipid panel evaluation. - Lipid Panel With LDL/HDL Ratio     Elizabeth Sauer, MD

## 2022-12-23 LAB — LIPID PANEL WITH LDL/HDL RATIO
Cholesterol, Total: 180 mg/dL (ref 100–199)
HDL: 44 mg/dL (ref >39)
LDL Chol Calc (NIH): 118 mg/dL — ABNORMAL HIGH (ref 0–99)
LDL/HDL Ratio: 2.7 ratio (ref 0.0–3.6)
Triglycerides: 99 mg/dL (ref 0–149)
VLDL Cholesterol Cal: 18 mg/dL (ref 5–40)

## 2023-03-09 DIAGNOSIS — M25522 Pain in left elbow: Secondary | ICD-10-CM | POA: Diagnosis not present

## 2023-03-10 ENCOUNTER — Ambulatory Visit: Payer: Medicare Other | Attending: Physician Assistant | Admitting: Physical Therapy

## 2023-03-10 ENCOUNTER — Encounter: Payer: Self-pay | Admitting: Physical Therapy

## 2023-03-10 DIAGNOSIS — M6281 Muscle weakness (generalized): Secondary | ICD-10-CM | POA: Diagnosis present

## 2023-03-10 DIAGNOSIS — M25522 Pain in left elbow: Secondary | ICD-10-CM | POA: Diagnosis present

## 2023-03-10 NOTE — Therapy (Signed)
OUTPATIENT PHYSICAL THERAPY ELBOW EVALUATION  Patient Name: Axell Trigueros MRN: [id number] DOB:Jul 25, 1946, 77 y.o., male Today's Date: 03/10/2023  END OF SESSION:  PT End of Session - 03/10/23 1026     Visit Number 1    Number of Visits 10    Date for PT Re-Evaluation 04/28/23    Authorization Type Medicare 2024    PT Start Time 0945    PT Stop Time 1026    PT Time Calculation (min) 41 min    Activity Tolerance Patient tolerated treatment well    Behavior During Therapy WFL for tasks assessed/performed             Past Medical History:  Diagnosis Date   Arthritis    Dysrhythmia 2007   H/O IRREGULAR HEART BEAT-SAW CARDIOLOGIST AND SAID IT WAS NOTHING TO BE CONCERNED WITH   GERD (gastroesophageal reflux disease)    H/O   Hyperlipidemia    Pre-diabetes    Past Surgical History:  Procedure Laterality Date   COLONOSCOPY     COLONOSCOPY WITH PROPOFOL N/A 06/02/2020   Procedure: COLONOSCOPY WITH PROPOFOL;  Surgeon: Toney Reil, MD;  Location: [facility] ENDOSCOPY;  Service: Gastroenterology;  Laterality: N/A;   ESOPHAGOGASTRODUODENOSCOPY     ESOPHAGOGASTRODUODENOSCOPY (EGD) WITH PROPOFOL N/A 09/02/2020   Procedure: ESOPHAGOGASTRODUODENOSCOPY (EGD) WITH PROPOFOL;  Surgeon: Toney Reil, MD;  Location[facility] ENDOSCOPY;  Service: Gastroenterology;  Laterality: N/A;   EXCISION MASS NECK N/A 09/07/2016   Procedure: EXCISION MASS NECK;  Surgeon: Lattie Haw, MD;  Location: [facility] ORS;  Service: General;  Laterality: N/A;   EXCISION OF BACK LESION N/A 09/07/2016   Procedure: EXCISION OF BACK LESION;  Surgeon: Lattie Haw, MD;  Location: [facility] ORS;  Service: General;  Laterality: N/A;   MOUTH SURGERY     dental procedure   RETINAL LASER PROCEDURE Right 04/06/2021   [facility] in [place]   Patient Active Problem List   Diagnosis Date Noted   Retinal tear of right eye 12/21/2021   Dysphagia    Cervical spondylosis with radiculopathy 10/02/2015    Prediabetes 09/09/2015   Personal history of colonic polyps 09/05/2015   Hyperlipidemia 09/04/2015   GERD (gastroesophageal reflux disease) 09/04/2015   Dry eyes 09/04/2015    PCP: Duanne Limerick, MD  REFERRING PROVIDER: Rosilyn Mings, PA-C  REFERRING DIAG: M25.522 Pain in left elbow   RATIONALE FOR EVALUATION AND TREATMENT: Rehabilitation  THERAPY DIAG: Pain in left elbow  Muscle weakness (generalized)  ONSET DATE: 03/08/23   FOLLOW-UP APPT SCHEDULED WITH REFERRING PROVIDER: Yes , f/u at [facility] in one month    SUBJECTIVE:  SUBJECTIVE STATEMENT:  77 year old male with L elbow pain  PERTINENT HISTORY: Patient reports pain along L medial elbow with swinging golf club. Pain started on Tuesday, 03/08/23. He reports notable fatigability along his elbow. He reports variability of pain. He reports that acetaminophen is keeping pain under control. Patient reports some NT affecting 4th-5th digit. Patient reports mild bruising on Tuesday that did resolve by today. Negative X-rays at [facility]. Pt has Hx of pain in cervical spine.   PAIN:  Pain Intensity: Present: 2/10, Best: 0/10, Worst: 4-5/10 Pain location: Medial elbow/medial epicondyle  Pain Quality:  Tiredness, sore to touch   Radiating: No  Numbness/Tingling: Yes, 4th-5th digit, some NT preceding this injury  Focal Weakness: No Aggravating factors: Direct touch/pressure on medial elbow, turning steering wheel, arm around his spouse  Relieving factors: Elbow sleeve, Acetaminophen, icing 24-hour pain behavior: None  History of prior shoulder or neck/shoulder injury, pain, surgery, or therapy: Yes, Hx of RTC-related pain in both shoulders, hx of stiff neck and improved neck pain with HEP (exercises given by Dr. Ashley Royalty) Falls: Has patient  fallen in last 6 months? No, Number of falls: N/A Dominant hand: left, pt plays golf right-handed  Imaging: Yes , Negative elbow radiographs at [facility]   Red flags (personal history of cancer, chills/fever, night sweats, nausea, vomiting, unrelenting pain): Negative  PRECAUTIONS: None  WEIGHT BEARING RESTRICTIONS: No   Living Environment Lives with: lives with their spouse Lives in: House/apartment   Prior level of function: Independent  Occupational demands: Retired   Presenter, broadcasting: Throwing baseball with son, golfing, hiking   Patient Goals: Back golfing, able to throw    OBJECTIVE:   Patient Surveys  FOTO: 37, predicted outcome score of 58    Cognition Patient is oriented to person, place, and time.  Recent memory is intact.  Remote memory is intact.  Attention span and concentration are intact.  Expressive speech is intact.  Patient's fund of knowledge is within normal limits for educational level.     Gross Musculoskeletal Assessment Tremor: None Bulk: Normal Tone: Normal   Posture Mild forward head, protracted scapulae   Cervical Screen AROM: WFL for all planes, exception of lateral flexion. Some L arm pain with flexion overpressure Spurlings A (ipsilateral lateral flexion/axial compression): R: Negative L: Positive for pain in neck Distraction Test: Negative  AROM AROM (Normal range in degrees) AROM 03/10/2023   Right Left  Shoulder    Flexion 150 150  Extension    Abduction [facility]  External Rotation [facility]  Internal Rotation 50 50  Hands Behind Head    Hands Behind Back        Elbow    Flexion WNL WNL  Extension WNL +3  Pronation WNL WNL  Supination WNL WNL  (* = pain; Blank rows = not tested)  UE MMT: MMT (out of 5) Right 03/10/2023 Left 03/10/2023      Shoulder   Flexion 5 5  Extension    Abduction 4+ 4* ( Pain in L shoulder)  External rotation 4+ 4* (Pain in L shoulder)  Internal rotation    Horizontal abduction     Horizontal adduction    Lower Trapezius    Rhomboids        Elbow  Flexion 5 5  Extension 5 5  Pronation  4*  Supination  5      Wrist  Flexion  4+  Extension  5  Radial deviation    Ulnar deviation    (* =  pain; Blank rows = not tested)  No pain with end-range elbow extension   Sensation Deferred  Reflexes Deferred   Palpation TTP common flexor tendon L elbow 1+, medial epicondyle 1+ Graded on 0-4 scale (0 = no pain, 1 = pain, 2 = pain with wincing/grimacing/flinching, 3 = pain with withdrawal, 4 = unwilling to allow palpation), (Blank rows = not tested)    SPECIAL TESTS  Medial Epicondylitis Test: Positive  Ligamentous Tests Valgus Stress: Negative for pain or laxity Varus Stress: Negative for pain or laxity     TODAY'S TREATMENT    Therapeutic Exercise - for HEP establishment, discussion on appropriate exercise/activity modification, PT education   Reviewed baseline home exercises and provided handout for [employer] program (see Access Code); tactile cueing and therapist demonstration utilized as needed for carryover of proper technique to HEP.    Patient education on current condition, anatomy involved, prognosis, plan of care. Discussion on activity modification to prevent flare-up of condition, including avoidance of aggressive valgus stress, avoidance of heavy pushing/pulling, use of elbow strap and/or Cho Pat strap for relief of affected area.     PATIENT EDUCATION:  Education details: see above for patient education details Person educated: Patient Education method: Explanation, Demonstration, and Handouts Education comprehension: verbalized understanding   HOME EXERCISE PROGRAM:  Access Code: [email or web address] URL: [email or web address] Date: 03/10/2023 Prepared by: Consuela Mimes  Exercises - Seated Isometric Wrist Flexion Supinated with Manual Resistance  - 2-3 x daily - 7 x weekly - 2 sets - 10 reps - 10sec hold - Seated  Isometric Wrist Ulnar Deviation with Manual Resistance  - 2-3 x daily - 7 x weekly - 2 sets - 10 reps - 10sec hold    ASSESSMENT:  CLINICAL IMPRESSION: Patient is a 77 y.o. male  who was seen today for physical therapy evaluation and treatment for L medial elbow pain with onset of pain when swinging golf club earlier this week. Pt does not have pain with elbow valgus stress at this time, though with acute onset of pain and Hx of mild ecchymosis, pt may have had low-grade sprain of UCL. Pt has primary area of tenderness along proximal common wrist flexor tendon versus along UCL. Pt has pain with resisted pronation as well. Pt may have strained wrist flexors/pronators and has presentation similar to golfer's elbow. Pt has current impairments in: wrist flexor/pronator strength, pain with gripping/twisting, and local nociceptive pain along medial elbow. Pt will continue to benefit from skilled PT services to address deficits and improve function.   OBJECTIVE IMPAIRMENTS: decreased ROM, decreased strength, impaired UE functional use, and pain.   ACTIVITY LIMITATIONS: carrying, lifting, and reaching  PARTICIPATION LIMITATIONS:  playing golf, throwing baseball, hiking with walking poles  PERSONAL FACTORS: Age and 3+ comorbidities: (Hyperlipidemia, OA, pre-diabetes)  are also affecting patient's functional outcome.   REHAB POTENTIAL: Good  CLINICAL DECISION MAKING: Evolving/moderate complexity  EVALUATION COMPLEXITY: Moderate   GOALS: Goals reviewed with patient? Yes  SHORT TERM GOALS: Target date: 04/03/2023  Pt will be independent with HEP to improve strength and decrease neck pain to improve pain-free function at home and work. Baseline: 03/10/23: Baseline HEP initiated with simple wrist flexor and ulnar deviation isometrics.  Goal status: INITIAL   LONG TERM GOALS: Target date: 04/21/2023  Pt will increase FOTO to at least 79 to demonstrate significant improvement in function at home  and work related to neck pain  Baseline: 03/10/23: 66 Goal status: INITIAL  2.  Pt will decrease worst elbow pain  by at least 3 points on the NPRS in order to demonstrate clinically significant reduction in shoulder pain. Baseline: 03/10/23: Pain 5/10 at worst Goal status: INITIAL  3.  Pt will demonstrate golf swing with normal backswing and full follow-through with no pain reproduction as needed for full return to participation in golfing        Baseline: 03/10/23: Onset of pain with golf swing Goal status: INITIAL  4. Pt will increase strength by at least 1/2 MMT grade for wrist flexors and pronators and have no pain reproduction with muscle testing in order to demonstrate improvement in strength and function      Baseline: 03/10/23: 4-4+/5 Goal status: INITIAL   PLAN: PT FREQUENCY: 1-2x/week  PT DURATION: 6 weeks  PLANNED INTERVENTIONS: Therapeutic exercises, Therapeutic activity, Neuromuscular re-education, Balance training, Gait training, Patient/Family education, Self Care, Joint mobilization, Joint manipulation, Vestibular training, Canalith repositioning, Orthotic/Fit training, DME instructions, Dry Needling, Electrical stimulation, Spinal manipulation, Spinal mobilization, Cryotherapy, Moist heat, Taping, Traction, Ultrasound, Ionotophoresis 4mg /ml Dexamethasone, Manual therapy, and Re-evaluation.  PLAN FOR NEXT SESSION: STM/DTM and isometrics for analgesic effect along wrist flexors/pronator teres. Consider dry needling as needed if pt has ongoing pain. Progressive loading and work into eccentrics as able.    Consuela Mimes, PT, DPT #[id number]  Gertie Exon, PT 03/10/2023, 10:28 AM

## 2023-03-23 ENCOUNTER — Encounter: Payer: Self-pay | Admitting: Physical Therapy

## 2023-03-23 ENCOUNTER — Ambulatory Visit: Payer: Medicare Other | Admitting: Physical Therapy

## 2023-03-23 DIAGNOSIS — M6281 Muscle weakness (generalized): Secondary | ICD-10-CM | POA: Diagnosis not present

## 2023-03-23 DIAGNOSIS — M25522 Pain in left elbow: Secondary | ICD-10-CM

## 2023-03-23 NOTE — Therapy (Signed)
OUTPATIENT PHYSICAL THERAPY ELBOW TREATMENT  Patient Name: Jeff Mcmahon MRN: [id number] DOB:[birth year], 77 y.o., male Today's Date: 03/23/2023  END OF SESSION:  PT End of Session - 03/23/23 0947     Visit Number 2    Number of Visits 10    Date for PT Re-Evaluation 04/28/23    Authorization Type Medicare 2024    PT Start Time 0948    PT Stop Time 1030    PT Time Calculation (min) 42 min    Activity Tolerance Patient tolerated treatment well    Behavior During Therapy WFL for tasks assessed/performed              Past Medical History:  Diagnosis Date   Arthritis    Dysrhythmia 2007   H/O IRREGULAR HEART BEAT-SAW CARDIOLOGIST AND SAID IT WAS NOTHING TO BE CONCERNED WITH   GERD (gastroesophageal reflux disease)    H/O   Hyperlipidemia    Pre-diabetes    Past Surgical History:  Procedure Laterality Date   COLONOSCOPY     COLONOSCOPY WITH PROPOFOL N/A 06/02/2020   Procedure: COLONOSCOPY WITH PROPOFOL;  Surgeon: Toney Reil, MD;  Location: [facility] ENDOSCOPY;  Service: Gastroenterology;  Laterality: N/A;   ESOPHAGOGASTRODUODENOSCOPY     ESOPHAGOGASTRODUODENOSCOPY (EGD) WITH PROPOFOL N/A 09/02/2020   Procedure: ESOPHAGOGASTRODUODENOSCOPY (EGD) WITH PROPOFOL;  Surgeon: Toney Reil, MD;  Location: [facility] ENDOSCOPY;  Service: Gastroenterology;  Laterality: N/A;   EXCISION MASS NECK N/A 09/07/2016   Procedure: EXCISION MASS NECK;  Surgeon: Lattie Haw, MD;  Location: [facility] ORS;  Service: General;  Laterality: N/A;   EXCISION OF BACK LESION N/A 09/07/2016   Procedure: EXCISION OF BACK LESION;  Surgeon: Lattie Haw, MD;  Location: [facility] ORS;  Service: General;  Laterality: N/A;   MOUTH SURGERY     dental procedure   RETINAL LASER PROCEDURE Right 04/06/2021   [facility] Retinal Specialist in [place]   Patient Active Problem List   Diagnosis Date Noted   Retinal tear of right eye 12/21/2021   Dysphagia    Cervical spondylosis with radiculopathy  10/02/2015   Prediabetes 09/09/2015   Personal history of colonic polyps 09/05/2015   Hyperlipidemia 09/04/2015   GERD (gastroesophageal reflux disease) 09/04/2015   Dry eyes 09/04/2015    PCP: Duanne Limerick, MD  REFERRING PROVIDER: Rosilyn Mings, PA-C  REFERRING DIAG: M25.522 Pain in left elbow   RATIONALE FOR EVALUATION AND TREATMENT: Rehabilitation  THERAPY DIAG: Pain in left elbow  Muscle weakness (generalized)  ONSET DATE: 03/08/23   FOLLOW-UP APPT SCHEDULED WITH REFERRING PROVIDER: Yes , f/u at [facility] in one month    SUBJECTIVE:  PERTINENT HISTORY: Patient reports pain along L medial elbow with swinging golf club. Pain started on Tuesday, 03/08/23. He reports notable fatigability along his elbow. He reports variability of pain. He reports that acetaminophen is keeping pain under control. Patient reports some NT affecting 4th-5th digit. Patient reports mild bruising on Tuesday that did resolve by today. Negative X-rays at [facility]. Pt has Hx of pain in cervical spine.   PAIN:  Pain Intensity: Present: 2/10, Best: 0/10, Worst: 4-5/10 Pain location: Medial elbow/medial epicondyle  Pain Quality:  Tiredness, sore to touch   Radiating: No  Numbness/Tingling: Yes, 4th-5th digit, some NT preceding this injury  Focal Weakness: No Aggravating factors: Direct touch/pressure on medial elbow, turning steering wheel, arm around his spouse  Relieving factors: Elbow sleeve, Acetaminophen, icing 24-hour pain behavior: None  History of prior shoulder or neck/shoulder injury, pain, surgery, or therapy: Yes, Hx of RTC-related pain in both shoulders, hx of stiff neck and improved neck pain with HEP (exercises given by Dr. Ashley Royalty) Falls: Has patient fallen in last 6 months? 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 Some L arm pain with flexion overpressure Spurlings A (ipsilateral lateral flexion/axial compression): R: Negative L: Positive for pain in neck Distraction Test: Negative  AROM AROM (Normal range in degrees) AROM 03/23/2023   Right Left  Shoulder    Flexion 150 150  Extension    Abduction [facility]  External Rotation [facility]  Internal Rotation 50 50  Hands Behind Head    Hands Behind Back        Elbow    Flexion WNL WNL  Extension WNL +3  Pronation WNL WNL  Supination WNL WNL  (* = pain; Blank rows = not tested)  UE MMT: MMT (out of 5) Right 03/23/2023 Left 03/23/2023      Shoulder   Flexion 5 5  Extension    Abduction 4+ 4* ( Pain in L shoulder)  External rotation 4+ 4* (Pain in L shoulder)  Internal rotation    Horizontal abduction    Horizontal adduction    Lower Trapezius    Rhomboids        Elbow  Flexion 5 5  Extension 5 5  Pronation  4*  Supination  5      Wrist  Flexion  4+  Extension  5  Radial deviation    Ulnar deviation    (* = pain; Blank rows = not tested)  No pain with end-range elbow extension   Sensation Deferred  Reflexes Deferred   Palpation TTP common flexor tendon L elbow 1+, medial  epicondyle 1+ Graded on 0-4 scale (0 = no pain, 1 = pain, 2 = pain with wincing/grimacing/flinching, 3 = pain with withdrawal, 4 = unwilling to allow palpation), (Blank rows = not tested)    SPECIAL TESTS  Medial Epicondylitis Test: Positive  Ligamentous Tests Valgus Stress: Negative for pain or laxity Varus Stress: Negative for pain or laxity     TODAY'S TREATMENT     Manual Therapy - for symptom modulation, soft tissue sensitivity and mobility, joint mobility, ROM   STM/DTM and IASTM with Edge Tool along wrist flexors/pronator teres; x 10 minutes Cross-friction massage along common wrist flexor tendon; x 3 minutes   Therapeutic Exercise - improved strength as needed to improve performance of CKC activities/functional movements, progressive loading to promote healing/remodeling of affected muscle-tendon group  Isometric wrist flexion; x 10, 10 sec hold Isometric ulnar deviation; x 10, 10 sec hold Resisted wrist flexion, with slow eccentric; 3-lb Dbel; 2x10 Hammer supination for eccentric loading of pronators; 2x10 Standing Tband row with Black Tband; 2x10, 3 sec hold  -tactile and verbal cueing for scapular depression/retraction  PATIENT EDUCATION: HEP update and review. Provision of new Med[employer] handout.        PATIENT EDUCATION:  Education details: see above for patient education details Person educated: Patient Education method: Explanation, Demonstration, and Handouts Education comprehension: verbalized understanding   HOME EXERCISE PROGRAM:  Access Code: [email or web address] URL: [email or web address] Date: 03/28/2023 Prepared by: Consuela Mimes  Exercises - Seated Isometric Wrist Flexion Supinated with Manual Resistance  - 2 x daily - 7 x weekly - 2 sets - 10 reps - 10sec hold - Seated Isometric Wrist Ulnar Deviation with Manual Resistance  - 2 x daily - 7 x weekly - 2 sets - 10 reps - 10sec hold - Seated Wrist Flexion with Dumbbell  - 2 x daily -  7 x weekly - 2 sets - 10 reps - Forearm Pronation and Supination with Hammer  - 2 x daily - 7 x weekly - 2 sets - 10 reps    ASSESSMENT:  CLINICAL IMPRESSION:  Patient arrives with excellent motivation to participate in physical therapy. Pt tolerates direct treatment along common wrist flexor tendon, muscle belly of FCR, flexor digitorum, and FCU. We progressed with loading of affected muscle-tendon groups with emphasis on slow eccentric. We can progress to heavier eccentric loads as tolerated by pt. Patient has remaining deficits in wrist flexor/pronator strength, pain with gripping/twisting, and local nociceptive pain along medial elbow. Patient will benefit from continued skilled therapeutic intervention to address the above deficits as needed for improved function and QoL.    OBJECTIVE IMPAIRMENTS: decreased ROM, decreased strength, impaired UE functional use, and pain.   ACTIVITY LIMITATIONS: carrying, lifting, and reaching  PARTICIPATION LIMITATIONS:  playing golf, throwing baseball, hiking with walking poles  PERSONAL FACTORS: Age and 3+ comorbidities: (Hyperlipidemia, OA, pre-diabetes)  are also affecting patient's functional outcome.   REHAB POTENTIAL: Good  CLINICAL DECISION MAKING: Evolving/moderate complexity  EVALUATION COMPLEXITY: Moderate   GOALS: Goals reviewed with patient? Yes  SHORT TERM GOALS: Target date: 04/13/2023  Pt will be independent with HEP to improve strength and decrease neck pain to improve pain-free function at home and work. Baseline: 03/10/23: Baseline HEP initiated with simple wrist flexor and ulnar deviation isometrics.  Goal status: INITIAL   LONG TERM GOALS: Target date: 04/21/2023  Pt will increase FOTO to at least 79 to demonstrate significant improvement in function at home and work related to neck pain  Baseline: 03/10/23: 66 Goal status: INITIAL  2.  Pt will decrease worst elbow pain by at least 3 points on the NPRS in order to  demonstrate clinically significant reduction in shoulder pain. Baseline: 03/10/23: Pain 5/10 at worst Goal status: INITIAL  3.  Pt will demonstrate golf swing with normal backswing and full follow-through with no pain reproduction as needed for full return to participation in golfing        Baseline: 03/10/23: Onset of pain with golf swing Goal status: INITIAL  4. Pt will increase strength by at least 1/2 MMT grade for wrist flexors and pronators and have no pain reproduction with muscle testing in order to demonstrate improvement in strength and function      Baseline: 03/10/23: 4-4+/5 Goal status: INITIAL   PLAN: PT FREQUENCY: 1-2x/week  PT DURATION: 6 weeks  PLANNED INTERVENTIONS: Therapeutic exercises, Therapeutic activity, Neuromuscular re-education, Balance training, Gait training, Patient/Family education, Self Care, Joint mobilization, Joint manipulation, Vestibular training, Canalith repositioning, Orthotic/Fit training, DME instructions, Dry Needling, Electrical stimulation, Spinal manipulation, Spinal mobilization, Cryotherapy, Moist heat, Taping, Traction, Ultrasound, Ionotophoresis 4mg /ml Dexamethasone, Manual therapy, and Re-evaluation.  PLAN FOR NEXT SESSION: STM/DTM and isometrics for analgesic effect along wrist flexors/pronator teres. Consider dry needling as needed if pt has ongoing pain. Progressive loading and work into eccentrics as able.    Consuela Mimes, PT, DPT #[id number]  Gertie Exon, PT 03/23/2023, 9:48 AM

## 2023-03-28 ENCOUNTER — Ambulatory Visit: Payer: Medicare Other | Admitting: Physical Therapy

## 2023-03-28 ENCOUNTER — Encounter: Payer: Self-pay | Admitting: Physical Therapy

## 2023-03-28 DIAGNOSIS — M25522 Pain in left elbow: Secondary | ICD-10-CM

## 2023-03-28 DIAGNOSIS — M6281 Muscle weakness (generalized): Secondary | ICD-10-CM | POA: Diagnosis not present

## 2023-03-28 NOTE — Therapy (Signed)
OUTPATIENT PHYSICAL THERAPY TREATMENT  Patient Name: Jeff Mcmahon MRN: [id number] DOB:21-Mar-1946, 77 y.o., male Today's Date: 03/28/2023  END OF SESSION:  PT End of Session - 03/28/23 1109     Visit Number 3    Number of Visits 10    Date for PT Re-Evaluation 04/28/23    Authorization Type Medicare 2024    PT Start Time 1112    PT Stop Time 1158    PT Time Calculation (min) 46 min    Activity Tolerance Patient tolerated treatment well    Behavior During Therapy WFL for tasks assessed/performed               Past Medical History:  Diagnosis Date   Arthritis    Dysrhythmia 2007   H/O IRREGULAR HEART BEAT-SAW CARDIOLOGIST AND SAID IT WAS NOTHING TO BE CONCERNED WITH   GERD (gastroesophageal reflux disease)    H/O   Hyperlipidemia    Pre-diabetes    Past Surgical History:  Procedure Laterality Date   COLONOSCOPY     COLONOSCOPY WITH PROPOFOL N/A 06/02/2020   Procedure: COLONOSCOPY WITH PROPOFOL;  Surgeon: Toney Reil, MD;  Location: [facility] ENDOSCOPY;  Service: Gastroenterology;  Laterality: N/A;   ESOPHAGOGASTRODUODENOSCOPY     ESOPHAGOGASTRODUODENOSCOPY (EGD) WITH PROPOFOL N/A 09/02/2020   Procedure: ESOPHAGOGASTRODUODENOSCOPY (EGD) WITH PROPOFOL;  Surgeon: Toney Reil, MD;  Location: [facility] ENDOSCOPY;  Service: Gastroenterology;  Laterality: N/A;   EXCISION MASS NECK N/A 09/07/2016   Procedure: EXCISION MASS NECK;  Surgeon: Lattie Haw, MD;  Location: [facility] ORS;  Service: General;  Laterality: N/A;   EXCISION OF BACK LESION N/A 09/07/2016   Procedure: EXCISION OF BACK LESION;  Surgeon: Lattie Haw, MD;  Location: [facility] ORS;  Service: General;  Laterality: N/A;   MOUTH SURGERY     dental procedure   RETINAL LASER PROCEDURE Right 04/06/2021   [facility] Retinal Specialist in [place]   Patient Active Problem List   Diagnosis Date Noted   Retinal tear of right eye 12/21/2021   Dysphagia    Cervical spondylosis with radiculopathy 10/02/2015    Prediabetes 09/09/2015   Personal history of colonic polyps 09/05/2015   Hyperlipidemia 09/04/2015   GERD (gastroesophageal reflux disease) 09/04/2015   Dry eyes 09/04/2015    PCP: Duanne Limerick, MD  REFERRING PROVIDER: Rosilyn Mings, PA-C  REFERRING DIAG: M25.522 Pain in left elbow   RATIONALE FOR EVALUATION AND TREATMENT: Rehabilitation  THERAPY DIAG: Pain in left elbow  Muscle weakness (generalized)  ONSET DATE: 03/08/23   FOLLOW-UP APPT SCHEDULED WITH REFERRING PROVIDER: Yes , f/u at [facility] in one month    SUBJECTIVE:  PERTINENT HISTORY: Patient reports pain along L medial elbow with swinging golf club. Pain started on Tuesday, 03/08/23. He reports notable fatigability along his elbow. He reports variability of pain. He reports that acetaminophen is keeping pain under control. Patient reports some NT affecting 4th-5th digit. Patient reports mild bruising on Tuesday that did resolve by today. Negative X-rays at [facility]. Pt has Hx of pain in cervical spine.   PAIN:  Pain Intensity: Present: 2/10, Best: 0/10, Worst: 4-5/10 Pain location: Medial elbow/medial epicondyle  Pain Quality:  Tiredness, sore to touch   Radiating: No  Numbness/Tingling: Yes, 4th-5th digit, some NT preceding this injury  Focal Weakness: No Aggravating factors: Direct touch/pressure on medial elbow, turning steering wheel, arm around his spouse  Relieving factors: Elbow sleeve, Acetaminophen, icing 24-hour pain behavior: None  History of prior shoulder or neck/shoulder injury, pain, surgery, or therapy: Yes, Hx of RTC-related pain in both shoulders, hx of stiff neck and improved neck pain with HEP (exercises given by Dr. Ashley Royalty) Falls: Has patient fallen in last 6 months? 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 Some L arm pain with flexion overpressure Spurlings A (ipsilateral lateral flexion/axial compression): R: Negative L: Positive for pain in neck Distraction Test: Negative  AROM AROM (Normal range in degrees) AROM 03/28/2023   Right Left  Shoulder    Flexion 150 150  Extension    Abduction [facility]  External Rotation [facility]  Internal Rotation 50 50  Hands Behind Head    Hands Behind Back        Elbow    Flexion WNL WNL  Extension WNL +3  Pronation WNL WNL  Supination WNL WNL  (* = pain; Blank rows = not tested)  UE MMT: MMT (out of 5) Right 03/28/2023 Left 03/28/2023      Shoulder   Flexion 5 5  Extension    Abduction 4+ 4* ( Pain in L shoulder)  External rotation 4+ 4* (Pain in L shoulder)  Internal rotation    Horizontal abduction    Horizontal adduction    Lower Trapezius    Rhomboids        Elbow  Flexion 5 5  Extension 5 5  Pronation  4*  Supination  5      Wrist  Flexion  4+  Extension  5  Radial deviation    Ulnar deviation    (* = pain; Blank rows = not tested)  No pain with end-range elbow extension   Sensation Deferred  Reflexes Deferred   Palpation TTP common flexor tendon L elbow 1+, medial epicondyle 1+ Graded on 0-4  scale (0 = no pain, 1 = pain, 2 = pain with wincing/grimacing/flinching, 3 = pain with withdrawal, 4 = unwilling to allow palpation), (Blank rows = not tested)    SPECIAL TESTS  Medial Epicondylitis Test: Positive  Ligamentous Tests Valgus Stress: Negative for pain or laxity Varus Stress: Negative for pain or laxity     TODAY'S TREATMENT    SUBJECTIVE STATEMENT: Pt reports minimal soreness after last visit. He reports some pain with attempting 5-lb resisted wrist flexion at home. He reports tolerating 3 pounds well, and he is compliant with overall HEP. Pt reports no significant pain at rest at arrival. He uses elbow brace for yard work, driving, and other activities that may irritate elbow or activities that are more vigorous.    Manual Therapy - for symptom modulation, soft tissue sensitivity and mobility, joint mobility, ROM   STM/DTM and IASTM with Edge Tool along wrist flexors/pronator teres; x 10 minutes Cross-friction massage along common wrist flexor tendon; x 3 minutes   Therapeutic Exercise - improved strength as needed to improve performance of CKC activities/functional movements, progressive loading to promote healing/remodeling of affected muscle-tendon group  Upper body ergometer, 2 minutes forward, 2 minutes backward - for tissue warm-up to improve muscle performance, improved soft tissue mobility/extensibility - 2 minutes unbilled time   Resisted wrist flexion, with slow eccentric; 4-lb Dbell; 2x10 Hammer supination for eccentric loading of pronators; 2x15 Standing Tband row with Black Tband; 2x10, 3 sec hold  -tactile and verbal cueing for scapular depression/retraction Resisted internal rotation walkout with Blue Tband; 1x12 with isometric hold of shoulder/elbow  PATIENT EDUCATION: We discussed at length principles of progressing volume and load with his home program. We discussed expectations for gradual return to activity as load is gradually  increased.    *not today* Isometric wrist flexion; x 10, 10 sec hold Isometric ulnar deviation; x 10, 10 sec hold   PATIENT EDUCATION:  Education details: see above for patient education details Person educated: Patient Education method: Explanation, Demonstration, and Handouts Education comprehension: verbalized understanding   HOME EXERCISE PROGRAM:  Access Code: [email or web address] URL: [email or web address] Date: 03/28/2023 Prepared by: Consuela Mimes  Exercises - Seated Isometric Wrist Flexion Supinated with Manual Resistance  - 2 x daily - 7 x weekly - 2 sets - 10 reps - 10sec hold - Seated Isometric Wrist Ulnar Deviation with Manual Resistance  - 2 x daily - 7 x weekly - 2 sets - 10 reps - 10sec hold - Seated Wrist Flexion with Dumbbell  - 2 x daily - 7 x weekly - 2 sets - 10 reps - Forearm Pronation and Supination with Hammer  - 2 x daily - 7 x weekly - 2 sets - 10 reps    ASSESSMENT:  CLINICAL IMPRESSION:  Patient is able to modestly increase load for wrist flexion isotonics and volume for resisted pronation with emphasis on slow eccentric. Pt has notable sensitivity along proximal FCU and common wrist flexor tendon (with the tendon being most sensitive to palpation). Pt participates very well with PT and is motivated to return to activity; we discussed expected gradual progression of activity over the next 3 weeks. Patient has remaining deficits in wrist flexor/pronator strength, pain with gripping/twisting, and local nociceptive pain along medial elbow. Patient will benefit from continued skilled therapeutic intervention to address the above deficits as needed for improved function and QoL.    OBJECTIVE IMPAIRMENTS: decreased ROM, decreased strength, impaired UE functional use, and pain.   ACTIVITY LIMITATIONS: carrying, lifting, and reaching  PARTICIPATION LIMITATIONS:  playing golf, throwing baseball, hiking with walking poles  PERSONAL FACTORS: Age and 3+  comorbidities: (Hyperlipidemia, OA, pre-diabetes)  are also affecting patient's functional outcome.   REHAB  POTENTIAL: Good  CLINICAL DECISION MAKING: Evolving/moderate complexity  EVALUATION COMPLEXITY: Moderate   GOALS: Goals reviewed with patient? Yes  SHORT TERM GOALS: Target date: 04/18/2023  Pt will be independent with HEP to improve strength and decrease neck pain to improve pain-free function at home and work. Baseline: 03/10/23: Baseline HEP initiated with simple wrist flexor and ulnar deviation isometrics.  Goal status: INITIAL   LONG TERM GOALS: Target date: 04/21/2023  Pt will increase FOTO to at least 79 to demonstrate significant improvement in function at home and work related to neck pain  Baseline: 03/10/23: 66 Goal status: INITIAL  2.  Pt will decrease worst elbow pain by at least 3 points on the NPRS in order to demonstrate clinically significant reduction in shoulder pain. Baseline: 03/10/23: Pain 5/10 at worst Goal status: INITIAL  3.  Pt will demonstrate golf swing with normal backswing and full follow-through with no pain reproduction as needed for full return to participation in golfing        Baseline: 03/10/23: Onset of pain with golf swing Goal status: INITIAL  4. Pt will increase strength by at least 1/2 MMT grade for wrist flexors and pronators and have no pain reproduction with muscle testing in order to demonstrate improvement in strength and function      Baseline: 03/10/23: 4-4+/5 Goal status: INITIAL   PLAN: PT FREQUENCY: 1-2x/week  PT DURATION: 6 weeks  PLANNED INTERVENTIONS: Therapeutic exercises, Therapeutic activity, Neuromuscular re-education, Balance training, Gait training, Patient/Family education, Self Care, Joint mobilization, Joint manipulation, Vestibular training, Canalith repositioning, Orthotic/Fit training, DME instructions, Dry Needling, Electrical stimulation, Spinal manipulation, Spinal mobilization, Cryotherapy, Moist heat,  Taping, Traction, Ultrasound, Ionotophoresis 4mg /ml Dexamethasone, Manual therapy, and Re-evaluation.  PLAN FOR NEXT SESSION: STM/DTM and isometrics for analgesic effect along wrist flexors/pronator teres. Consider dry needling as needed if pt has ongoing pain. Progressive loading and work into eccentrics as able.    Consuela Mimes, PT, DPT #[id number]  Gertie Exon, PT 03/28/2023, 12:40 PM

## 2023-03-30 ENCOUNTER — Ambulatory Visit: Payer: Medicare Other | Admitting: Physical Therapy

## 2023-03-30 ENCOUNTER — Encounter: Payer: Self-pay | Admitting: Physical Therapy

## 2023-03-30 DIAGNOSIS — M6281 Muscle weakness (generalized): Secondary | ICD-10-CM | POA: Diagnosis not present

## 2023-03-30 DIAGNOSIS — M25522 Pain in left elbow: Secondary | ICD-10-CM

## 2023-03-30 NOTE — Therapy (Signed)
OUTPATIENT PHYSICAL THERAPY TREATMENT  Patient Name: Jeff Mcmahon MRN: [id number] DOB:1946-02-09, 77 y.o., male Today's Date: 03/30/2023  END OF SESSION:  PT End of Session - 03/30/23 0954     Visit Number 4    Number of Visits 10    Date for PT Re-Evaluation 04/28/23    Authorization Type Medicare 2024    PT Start Time 0950    PT Stop Time 1030    PT Time Calculation (min) 40 min    Activity Tolerance Patient tolerated treatment well    Behavior During Therapy WFL for tasks assessed/performed             Past Medical History:  Diagnosis Date   Arthritis    Dysrhythmia 2007   H/O IRREGULAR HEART BEAT-SAW CARDIOLOGIST AND SAID IT WAS NOTHING TO BE CONCERNED WITH   GERD (gastroesophageal reflux disease)    H/O   Hyperlipidemia    Pre-diabetes    Past Surgical History:  Procedure Laterality Date   COLONOSCOPY     COLONOSCOPY WITH PROPOFOL N/A 06/02/2020   Procedure: COLONOSCOPY WITH PROPOFOL;  Surgeon: Toney Reil, MD;  Location: [facility] ENDOSCOPY;  Service: Gastroenterology;  Laterality: N/A;   ESOPHAGOGASTRODUODENOSCOPY     ESOPHAGOGASTRODUODENOSCOPY (EGD) WITH PROPOFOL N/A 09/02/2020   Procedure: ESOPHAGOGASTRODUODENOSCOPY (EGD) WITH PROPOFOL;  Surgeon: Toney Reil, MD;  Location: [facility] ENDOSCOPY;  Service: Gastroenterology;  Laterality: N/A;   EXCISION MASS NECK N/A 09/07/2016   Procedure: EXCISION MASS NECK;  Surgeon: Lattie Haw, MD;  Location: [facility] ORS;  Service: General;  Laterality: N/A;   EXCISION OF BACK LESION N/A 09/07/2016   Procedure: EXCISION OF BACK LESION;  Surgeon: Lattie Haw, MD;  Location: [facility] ORS;  Service: General;  Laterality: N/A;   MOUTH SURGERY     dental procedure   RETINAL LASER PROCEDURE Right 04/06/2021   [facility] in [place]   Patient Active Problem List   Diagnosis Date Noted   Retinal tear of right eye 12/21/2021   Dysphagia    Cervical spondylosis with radiculopathy 10/02/2015    Prediabetes 09/09/2015   Personal history of colonic polyps 09/05/2015   Hyperlipidemia 09/04/2015   GERD (gastroesophageal reflux disease) 09/04/2015   Dry eyes 09/04/2015    PCP: Duanne Limerick, MD  REFERRING PROVIDER: Rosilyn Mings, PA-C  REFERRING DIAG: M25.522 Pain in left elbow   RATIONALE FOR EVALUATION AND TREATMENT: Rehabilitation  THERAPY DIAG: Pain in left elbow  Muscle weakness (generalized)  ONSET DATE: 03/08/23   FOLLOW-UP APPT SCHEDULED WITH REFERRING PROVIDER: Yes , f/u at [facility] in one month    SUBJECTIVE:  PERTINENT HISTORY: Patient reports pain along L medial elbow with swinging golf club. Pain started on Tuesday, 03/08/23. He reports notable fatigability along his elbow. He reports variability of pain. He reports that acetaminophen is keeping pain under control. Patient reports some NT affecting 4th-5th digit. Patient reports mild bruising on Tuesday that did resolve by today. Negative X-[facility] at [facility]. Pt has Hx of pain in cervical spine.   PAIN:  Pain Intensity: Present: 2/10, Best: 0/10, Worst: 4-5/10 Pain location: Medial elbow/medial epicondyle  Pain Quality:  Tiredness, sore to touch   Radiating: No  Numbness/Tingling: Yes, 4th-5th digit, some NT preceding this injury  Focal Weakness: No Aggravating factors: Direct touch/pressure on medial elbow, turning steering wheel, arm around his spouse  Relieving factors: Elbow sleeve, Acetaminophen, icing 24-hour pain behavior: None  History of prior shoulder or neck/shoulder injury, pain, surgery, or therapy: Yes, Hx of RTC-related pain in both shoulders, hx of stiff neck and improved neck pain with HEP (exercises given by Dr. Ashley Royalty) Falls: Has patient fallen in last 6 months? 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 Some L arm pain with flexion overpressure Spurlings A (ipsilateral lateral flexion/axial compression): R: Negative L: Positive for pain in neck Distraction Test: Negative  AROM AROM (Normal range in degrees) AROM 03/30/2023   Right Left  Shoulder    Flexion 150 150  Extension    Abduction [facility]  External Rotation [facility]  Internal Rotation 50 50  Hands Behind Head    Hands Behind Back        Elbow    Flexion WNL WNL  Extension WNL +3  Pronation WNL WNL  Supination WNL WNL  (* = pain; Blank rows = not tested)  UE MMT: MMT (out of 5) Right 03/30/2023 Left 03/30/2023      Shoulder   Flexion 5 5  Extension    Abduction 4+ 4* ( Pain in L shoulder)  External rotation 4+ 4* (Pain in L shoulder)  Internal rotation    Horizontal abduction    Horizontal adduction    Lower Trapezius    Rhomboids        Elbow  Flexion 5 5  Extension 5 5  Pronation  4*  Supination  5      Wrist  Flexion  4+  Extension  5  Radial deviation    Ulnar deviation    (* = pain; Blank rows = not tested)  No pain with end-range elbow extension   Sensation Deferred  Reflexes Deferred   Palpation TTP common flexor tendon L elbow 1+, medial epicondyle 1+ Graded on 0-4  scale (0 = no pain, 1 = pain, 2 = pain with wincing/grimacing/flinching, 3 = pain with withdrawal, 4 = unwilling to allow palpation), (Blank rows = not tested)    SPECIAL TESTS  Medial Epicondylitis Test: Positive  Ligamentous Tests Valgus Stress: Negative for pain or laxity Varus Stress: Negative for pain or laxity     TODAY'S TREATMENT    SUBJECTIVE STATEMENT: Pt reports doing well with progression of HEP. He denies pain at rest at arrival. He reports playing pickleball and swinging with his opposite UE for most of his game - he accidentally swung with R arm twice and reports feeling okay with this.     Manual Therapy - for symptom modulation, soft tissue sensitivity and mobility, joint mobility, ROM   STM/DTM and IASTM with Edge Tool along wrist flexors/pronator teres; x 8 minutes Cross-friction massage along common wrist flexor tendon; x 3 minutes   Therapeutic Exercise - improved strength as needed to improve performance of CKC activities/functional movements, progressive loading to promote healing/remodeling of affected muscle-tendon group  Upper body ergometer, 2 minutes forward, 2 minutes backward - for tissue warm-up to improve muscle performance, improved soft tissue mobility/extensibility  -subjective gathered during portion of this time, 1 minute unbilled time   Resisted wrist flexion, with slow eccentric; 5-lb Dbell; 2x10 Hammer supination for eccentric loading of pronators; 2x10, 2-lb ankle weight strapped to end of hammer Standing single-arm Tband row with Blue Tband; 2x12, 3 sec hold  -tactile and verbal cueing for scapular depression/retraction  D1 and D2 flexion, Red Tband; anchored on 2nd hook from bottom; x20 for each   Wall bounce with arm in 90/90 position; LAX ball; 2x15    PATIENT EDUCATION: We discussed continued progression of loading program at home. We discussed gradually introduction of swinging golf club over the next week and pt to try  chipping at home over the next week prior to full drive and gameplay.     *not today* Resisted internal rotation walkout with Blue Tband; 1x12 with isometric hold of shoulder/elbow Isometric wrist flexion; x 10, 10 sec hold Isometric ulnar deviation; x 10, 10 sec hold   PATIENT EDUCATION:  Education details: see above for patient education details Person educated: Patient Education method: Explanation, Demonstration, and Handouts Education comprehension: verbalized understanding   HOME EXERCISE PROGRAM:  Access Code: [email or web address] URL: [email or web address] Date: 03/28/2023 Prepared by: Consuela Mimes  Exercises - Seated Isometric Wrist Flexion Supinated with Manual Resistance  - 2 x daily - 7 x weekly - 2 sets - 10 reps - 10sec hold - Seated Isometric Wrist Ulnar Deviation with Manual Resistance  - 2 x daily - 7 x weekly - 2 sets - 10 reps - 10sec hold - Seated Wrist Flexion with Dumbbell  - 2 x daily - 7 x weekly - 2 sets - 10 reps - Forearm Pronation and Supination with Hammer  - 2 x daily - 7 x weekly - 2 sets - 10 reps    ASSESSMENT:  CLINICAL IMPRESSION:  Patient is able to continue progression of intensity and volume for loading wrist flexors and pronators. He has inadvertently swung with his R arm during pickleball and reports not having pain with swinging just twice. He has no significant symptoms at rest. We have been able to progress to increased rapidity of movements with low-level valgus stress and diagonals without significant pain. We will continue to progress with compound movements and introduce swinging club over the next week. Patient has remaining deficits in wrist flexor/pronator strength, pain with gripping/twisting, and local nociceptive pain along medial elbow. Patient will benefit from continued skilled therapeutic intervention to address the above deficits as needed for improved function and QoL.  OBJECTIVE IMPAIRMENTS: decreased ROM,  decreased strength, impaired UE functional use, and pain.   ACTIVITY LIMITATIONS: carrying, lifting, and reaching  PARTICIPATION LIMITATIONS:  playing golf, throwing baseball, hiking with walking poles  PERSONAL FACTORS: Age and 3+ comorbidities: (Hyperlipidemia, OA, pre-diabetes)  are also affecting patient's functional outcome.   REHAB POTENTIAL: Good  CLINICAL DECISION MAKING: Evolving/moderate complexity  EVALUATION COMPLEXITY: Moderate   GOALS: Goals reviewed with patient? Yes  SHORT TERM GOALS: Target date: 04/20/2023  Pt will be independent with HEP to improve strength and decrease neck pain to improve pain-free function at home and work. Baseline: 03/10/23: Baseline HEP initiated with simple wrist flexor and ulnar deviation isometrics.  Goal status: INITIAL   LONG TERM GOALS: Target date: 04/21/2023  Pt will increase FOTO to at least 79 to demonstrate significant improvement in function at home and work related to neck pain  Baseline: 03/10/23: 66 Goal status: INITIAL  2.  Pt will decrease worst elbow pain by at least 3 points on the NPRS in order to demonstrate clinically significant reduction in shoulder pain. Baseline: 03/10/23: Pain 5/10 at worst Goal status: INITIAL  3.  Pt will demonstrate golf swing with normal backswing and full follow-through with no pain reproduction as needed for full return to participation in golfing        Baseline: 03/10/23: Onset of pain with golf swing Goal status: INITIAL  4. Pt will increase strength by at least 1/2 MMT grade for wrist flexors and pronators and have no pain reproduction with muscle testing in order to demonstrate improvement in strength and function      Baseline: 03/10/23: 4-4+/5 Goal status: INITIAL   PLAN: PT FREQUENCY: 1-2x/week  PT DURATION: 6 weeks  PLANNED INTERVENTIONS: Therapeutic exercises, Therapeutic activity, Neuromuscular re-education, Balance training, Gait training, Patient/Family education, Self  Care, Joint mobilization, Joint manipulation, Vestibular training, Canalith repositioning, Orthotic/Fit training, DME instructions, Dry Needling, Electrical stimulation, Spinal manipulation, Spinal mobilization, Cryotherapy, Moist heat, Taping, Traction, Ultrasound, Ionotophoresis 4mg /ml Dexamethasone, Manual therapy, and Re-evaluation.  PLAN FOR NEXT SESSION: STM/DTM and isometrics for analgesic effect along wrist flexors/pronator teres. Consider dry needling as needed if pt has ongoing pain. Progressive loading and work into eccentrics as able.  Gradual return to activity over next 1-2 weeks.    Consuela Mimes, PT, DPT #[id number]  Gertie Exon, PT 03/30/2023, 9:54 AM

## 2023-04-04 ENCOUNTER — Encounter: Payer: Self-pay | Admitting: Physical Therapy

## 2023-04-04 ENCOUNTER — Ambulatory Visit: Payer: Medicare Other | Admitting: Physical Therapy

## 2023-04-04 DIAGNOSIS — M25522 Pain in left elbow: Secondary | ICD-10-CM | POA: Diagnosis not present

## 2023-04-04 DIAGNOSIS — M6281 Muscle weakness (generalized): Secondary | ICD-10-CM

## 2023-04-04 NOTE — Therapy (Signed)
OUTPATIENT PHYSICAL THERAPY TREATMENT  Patient Name: Jeff Mcmahon MRN: [id number] DOB:1945/08/21, 77 y.o., male Today's Date: 04/04/2023  END OF SESSION:  PT End of Session - 04/04/23 1247     Visit Number 5    Number of Visits 10    Date for PT Re-Evaluation 04/28/23    Authorization Type Medicare 2024    PT Start Time 1243    PT Stop Time 1326    PT Time Calculation (min) 43 min    Activity Tolerance Patient tolerated treatment well    Behavior During Therapy WFL for tasks assessed/performed              Past Medical History:  Diagnosis Date   Arthritis    Dysrhythmia 2007   H/O IRREGULAR HEART BEAT-SAW CARDIOLOGIST AND SAID IT WAS NOTHING TO BE CONCERNED WITH   GERD (gastroesophageal reflux disease)    H/O   Hyperlipidemia    Pre-diabetes    Past Surgical History:  Procedure Laterality Date   COLONOSCOPY     COLONOSCOPY WITH PROPOFOL N/A 06/02/2020   Procedure: COLONOSCOPY WITH PROPOFOL;  Surgeon: Toney Reil, MD;  Location: [facility] ENDOSCOPY;  Service: Gastroenterology;  Laterality: N/A;   ESOPHAGOGASTRODUODENOSCOPY     ESOPHAGOGASTRODUODENOSCOPY (EGD) WITH PROPOFOL N/A 09/02/2020   Procedure: ESOPHAGOGASTRODUODENOSCOPY (EGD) WITH PROPOFOL;  Surgeon: Toney Reil, MD;  Location: [facility] ENDOSCOPY;  Service: Gastroenterology;  Laterality: N/A;   EXCISION MASS NECK N/A 09/07/2016   Procedure: EXCISION MASS NECK;  Surgeon: Lattie Haw, MD;  Location: [facility] ORS;  Service: General;  Laterality: N/A;   EXCISION OF BACK LESION N/A 09/07/2016   Procedure: EXCISION OF BACK LESION;  Surgeon: Lattie Haw, MD;  Location: [facility] ORS;  Service: General;  Laterality: N/A;   MOUTH SURGERY     dental procedure   RETINAL LASER PROCEDURE Right 04/06/2021   [facility] in [place]   Patient Active Problem List   Diagnosis Date Noted   Retinal tear of right eye 12/21/2021   Dysphagia    Cervical spondylosis with radiculopathy 10/02/2015    Prediabetes 09/09/2015   Personal history of colonic polyps 09/05/2015   Hyperlipidemia 09/04/2015   GERD (gastroesophageal reflux disease) 09/04/2015   Dry eyes 09/04/2015    PCP: Duanne Limerick, MD  REFERRING PROVIDER: Rosilyn Mings, PA-C  REFERRING DIAG: M25.522 Pain in left elbow   RATIONALE FOR EVALUATION AND TREATMENT: Rehabilitation  THERAPY DIAG: Pain in left elbow  Muscle weakness (generalized)  ONSET DATE: 03/08/23   FOLLOW-UP APPT SCHEDULED WITH REFERRING PROVIDER: Yes , f/u at [facility] in one month    SUBJECTIVE:  PERTINENT HISTORY: Patient reports pain along L medial elbow with swinging golf club. Pain started on Tuesday, 03/08/23. He reports notable fatigability along his elbow. He reports variability of pain. He reports that acetaminophen is keeping pain under control. Patient reports some NT affecting 4th-5th digit. Patient reports mild bruising on Tuesday that did resolve by today. Negative X-rays at [facility]. Pt has Hx of pain in cervical spine.   PAIN:  Pain Intensity: Present: 2/10, Best: 0/10, Worst: 4-5/10 Pain location: Medial elbow/medial epicondyle  Pain Quality:  Tiredness, sore to touch   Radiating: No  Numbness/Tingling: Yes, 4th-5th digit, some NT preceding this injury  Focal Weakness: No Aggravating factors: Direct touch/pressure on medial elbow, turning steering wheel, arm around his spouse  Relieving factors: Elbow sleeve, Acetaminophen, icing 24-hour pain behavior: None  History of prior shoulder or neck/shoulder injury, pain, surgery, or therapy: Yes, Hx of RTC-related pain in both shoulders, hx of stiff neck and improved neck pain with HEP (exercises given by Dr. Ashley Royalty) Falls: Has patient fallen in last 6 months? 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 Some L arm pain with flexion overpressure Spurlings A (ipsilateral lateral flexion/axial compression): R: Negative L: Positive for pain in neck Distraction Test: Negative  AROM AROM (Normal range in degrees) AROM 04/04/2023   Right Left  Shoulder    Flexion 150 150  Extension    Abduction [facility]  External Rotation [facility]  Internal Rotation 50 50  Hands Behind Head    Hands Behind Back        Elbow    Flexion WNL WNL  Extension WNL +3  Pronation WNL WNL  Supination WNL WNL  (* = pain; Blank rows = not tested)  UE MMT: MMT (out of 5) Right 04/04/2023 Left 04/04/2023      Shoulder   Flexion 5 5  Extension    Abduction 4+ 4* ( Pain in L shoulder)  External rotation 4+ 4* (Pain in L shoulder)  Internal rotation    Horizontal abduction    Horizontal adduction    Lower Trapezius    Rhomboids        Elbow  Flexion 5 5  Extension 5 5  Pronation  4*  Supination  5      Wrist  Flexion  4+  Extension  5  Radial deviation    Ulnar deviation    (* = pain; Blank rows = not tested)  No pain with end-range elbow extension   Sensation Deferred  Reflexes Deferred   Palpation TTP common flexor tendon L elbow 1+, medial epicondyle 1+ Graded on 0-4  scale (0 = no pain, 1 = pain, 2 = pain with wincing/grimacing/flinching, 3 = pain with withdrawal, 4 = unwilling to allow palpation), (Blank rows = not tested)    SPECIAL TESTS  Medial Epicondylitis Test: Positive  Ligamentous Tests Valgus Stress: Negative for pain or laxity Varus Stress: Negative for pain or laxity     TODAY'S TREATMENT    SUBJECTIVE STATEMENT: Pt reports doing well with progressing loading program at home. He utilized Tband given last visit to add rowing and D1/2 patterns. He reports tolerating last visit well.      Manual Therapy - for symptom modulation, soft tissue sensitivity and mobility, joint mobility, ROM   STM/DTM and IASTM with Edge Tool along wrist flexors/pronator teres; x 8 minutes Cross-friction massage along common wrist flexor tendon; x 2 minutes   Therapeutic Exercise - improved strength as needed to improve performance of CKC activities/functional movements, progressive loading to promote healing/remodeling of affected muscle-tendon group  Upper body ergometer, 2 minutes forward, 2 minutes backward - for tissue warm-up to improve muscle performance, improved soft tissue mobility/extensibility  -subjective gathered during portion of this time, 1 minute unbilled time   Resisted wrist flexion, with slow eccentric; 6-lb Dbell; 2x12 Hammer supination for eccentric loading of pronators; 2x12, 2-lb ankle weight strapped to end of hammer Standing single-arm Tband row with Black Tband; 2x12, 3 sec hold  -tactile and verbal cueing for scapular depression/retraction  D1 flexion, Green Tband; anchored on 2nd hook from bottom; x20 for each   Wall bounce with arm in 90/90 position; 2-lb Medball; 2x15   -Attempted golf swing with chipping motion and full follow-through; x 5 attempts for either without reproduction of pain   PATIENT EDUCATION: We discussed continued progression of loading program at home. We discussed attempted chipping and  short-distance swings at home as long as pt is not having significant pain.     *not today* Resisted internal rotation walkout with Blue Tband; 1x12 with isometric hold of shoulder/elbow Isometric wrist flexion; x 10, 10 sec hold Isometric ulnar deviation; x 10, 10 sec hold   PATIENT EDUCATION:  Education details: see above for patient education details Person educated: Patient Education method: Explanation, Demonstration, and Handouts Education comprehension: verbalized understanding   HOME EXERCISE PROGRAM:  Access Code: [email or web address] URL: [email or web address] Date: 03/28/2023 Prepared by: Consuela Mimes  Exercises - Seated Isometric Wrist Flexion Supinated with Manual Resistance  - 2 x daily - 7 x weekly - 2 sets - 10 reps - 10sec hold - Seated Isometric Wrist Ulnar Deviation with Manual Resistance  - 2 x daily - 7 x weekly - 2 sets - 10 reps - 10sec hold - Seated Wrist Flexion with Dumbbell  - 2 x daily - 7 x weekly - 2 sets - 10 reps - Forearm Pronation and Supination with Hammer  - 2 x daily - 7 x weekly - 2 sets - 10 reps    ASSESSMENT:  CLINICAL IMPRESSION:  Patient tolerates golf swing in low volume well today. Patient has notable tenderness at common wrist flexor tendon and musculotendinous junction. He is able to further progress load/volume today without notable pain reproduction > 1/10. We will continue progressing loading program and graded exposure to golf swinging/valgus stress as tolerated. Patient has remaining deficits in wrist flexor/pronator strength, pain with gripping/twisting, and local nociceptive pain along medial elbow. Patient will benefit from continued skilled therapeutic intervention to address the above deficits as needed for improved function and QoL.    OBJECTIVE IMPAIRMENTS: decreased ROM, decreased strength, impaired UE functional use, and pain.   ACTIVITY LIMITATIONS: carrying, lifting, and reaching  PARTICIPATION LIMITATIONS:   playing golf, throwing baseball, hiking with walking poles  PERSONAL FACTORS: Age  and 3+ comorbidities: (Hyperlipidemia, OA, pre-diabetes)  are also affecting patient's functional outcome.   REHAB POTENTIAL: Good  CLINICAL DECISION MAKING: Evolving/moderate complexity  EVALUATION COMPLEXITY: Moderate   GOALS: Goals reviewed with patient? Yes  SHORT TERM GOALS: Target date: 04/25/2023  Pt will be independent with HEP to improve strength and decrease neck pain to improve pain-free function at home and work. Baseline: 03/10/23: Baseline HEP initiated with simple wrist flexor and ulnar deviation isometrics.  Goal status: INITIAL   LONG TERM GOALS: Target date: 04/21/2023  Pt will increase FOTO to at least 79 to demonstrate significant improvement in function at home and work related to neck pain  Baseline: 03/10/23: 66 Goal status: INITIAL  2.  Pt will decrease worst elbow pain by at least 3 points on the NPRS in order to demonstrate clinically significant reduction in shoulder pain. Baseline: 03/10/23: Pain 5/10 at worst Goal status: INITIAL  3.  Pt will demonstrate golf swing with normal backswing and full follow-through with no pain reproduction as needed for full return to participation in golfing        Baseline: 03/10/23: Onset of pain with golf swing Goal status: INITIAL  4. Pt will increase strength by at least 1/2 MMT grade for wrist flexors and pronators and have no pain reproduction with muscle testing in order to demonstrate improvement in strength and function      Baseline: 03/10/23: 4-4+/5 Goal status: INITIAL   PLAN: PT FREQUENCY: 1-2x/week  PT DURATION: 6 weeks  PLANNED INTERVENTIONS: Therapeutic exercises, Therapeutic activity, Neuromuscular re-education, Balance training, Gait training, Patient/Family education, Self Care, Joint mobilization, Joint manipulation, Vestibular training, Canalith repositioning, Orthotic/Fit training, DME instructions, Dry Needling,  Electrical stimulation, Spinal manipulation, Spinal mobilization, Cryotherapy, Moist heat, Taping, Traction, Ultrasound, Ionotophoresis 4mg /ml Dexamethasone, Manual therapy, and Re-evaluation.  PLAN FOR NEXT SESSION: STM/DTM and isometrics for analgesic effect along wrist flexors/pronator teres. Consider dry needling as needed if pt has ongoing pain. Progressive loading and work into eccentrics as able.  Gradual return to activity over next 1-2 weeks.    Consuela Mimes, PT, DPT #[id number]  Gertie Exon, PT 04/04/2023, 1:28 PM

## 2023-04-06 ENCOUNTER — Ambulatory Visit: Payer: Medicare Other | Admitting: Physical Therapy

## 2023-04-06 DIAGNOSIS — M25522 Pain in left elbow: Secondary | ICD-10-CM

## 2023-04-06 DIAGNOSIS — M6281 Muscle weakness (generalized): Secondary | ICD-10-CM | POA: Diagnosis not present

## 2023-04-06 DIAGNOSIS — D2272 Melanocytic nevi of left lower limb, including hip: Secondary | ICD-10-CM | POA: Diagnosis not present

## 2023-04-06 DIAGNOSIS — L570 Actinic keratosis: Secondary | ICD-10-CM | POA: Diagnosis not present

## 2023-04-06 DIAGNOSIS — D2271 Melanocytic nevi of right lower limb, including hip: Secondary | ICD-10-CM | POA: Diagnosis not present

## 2023-04-06 DIAGNOSIS — D2262 Melanocytic nevi of left upper limb, including shoulder: Secondary | ICD-10-CM | POA: Diagnosis not present

## 2023-04-06 DIAGNOSIS — X32XXXA Exposure to sunlight, initial encounter: Secondary | ICD-10-CM | POA: Diagnosis not present

## 2023-04-06 DIAGNOSIS — D225 Melanocytic nevi of trunk: Secondary | ICD-10-CM | POA: Diagnosis not present

## 2023-04-06 DIAGNOSIS — L821 Other seborrheic keratosis: Secondary | ICD-10-CM | POA: Diagnosis not present

## 2023-04-06 DIAGNOSIS — B360 Pityriasis versicolor: Secondary | ICD-10-CM | POA: Diagnosis not present

## 2023-04-06 DIAGNOSIS — D2261 Melanocytic nevi of right upper limb, including shoulder: Secondary | ICD-10-CM | POA: Diagnosis not present

## 2023-04-06 NOTE — Therapy (Signed)
OUTPATIENT PHYSICAL THERAPY TREATMENT  Patient Name: Jeff Mcmahon MRN: [id number] DOB:1946/01/15, 77 y.o., male Today's Date: 04/06/2023  END OF SESSION:  PT End of Session - 04/06/23 1238     Visit Number 6    Number of Visits 10    Date for PT Re-Evaluation 04/28/23    Authorization Type Medicare 2024    PT Start Time 1243    PT Stop Time 1323    PT Time Calculation (min) 40 min    Activity Tolerance Patient tolerated treatment well    Behavior During Therapy WFL for tasks assessed/performed              Past Medical History:  Diagnosis Date   Arthritis    Dysrhythmia 2007   H/O IRREGULAR HEART BEAT-SAW CARDIOLOGIST AND SAID IT WAS NOTHING TO BE CONCERNED WITH   GERD (gastroesophageal reflux disease)    H/O   Hyperlipidemia    Pre-diabetes    Past Surgical History:  Procedure Laterality Date   COLONOSCOPY     COLONOSCOPY WITH PROPOFOL N/A 06/02/2020   Procedure: COLONOSCOPY WITH PROPOFOL;  Surgeon: Toney Reil, MD;  Location: [facility] ENDOSCOPY;  Service: Gastroenterology;  Laterality: N/A;   ESOPHAGOGASTRODUODENOSCOPY     ESOPHAGOGASTRODUODENOSCOPY (EGD) WITH PROPOFOL N/A 09/02/2020   Procedure: ESOPHAGOGASTRODUODENOSCOPY (EGD) WITH PROPOFOL;  Surgeon: Toney Reil, MD;  Location: [facility] ENDOSCOPY;  Service: Gastroenterology;  Laterality: N/A;   EXCISION MASS NECK N/A 09/07/2016   Procedure: EXCISION MASS NECK;  Surgeon: Lattie Haw, MD;  Location: [facility] ORS;  Service: General;  Laterality: N/A;   EXCISION OF BACK LESION N/A 09/07/2016   Procedure: EXCISION OF BACK LESION;  Surgeon: Lattie Haw, MD;  Location: [facility] ORS;  Service: General;  Laterality: N/A;   MOUTH SURGERY     dental procedure   RETINAL LASER PROCEDURE Right 04/06/2021   [facility] in [place]   Patient Active Problem List   Diagnosis Date Noted   Retinal tear of right eye 12/21/2021   Dysphagia    Cervical spondylosis with radiculopathy 10/02/2015    Prediabetes 09/09/2015   Personal history of colonic polyps 09/05/2015   Hyperlipidemia 09/04/2015   GERD (gastroesophageal reflux disease) 09/04/2015   Dry eyes 09/04/2015    PCP: Duanne Limerick, MD  REFERRING PROVIDER: Rosilyn Mings, PA-C  REFERRING DIAG: M25.522 Pain in left elbow   RATIONALE FOR EVALUATION AND TREATMENT: Rehabilitation  THERAPY DIAG: Pain in left elbow  Muscle weakness (generalized)  ONSET DATE: 03/08/23   FOLLOW-UP APPT SCHEDULED WITH REFERRING PROVIDER: Yes , f/u at [facility] in one month    SUBJECTIVE:  PERTINENT HISTORY: Patient reports pain along L medial elbow with swinging golf club. Pain started on Tuesday, 03/08/23. He reports notable fatigability along his elbow. He reports variability of pain. He reports that acetaminophen is keeping pain under control. Patient reports some NT affecting 4th-5th digit. Patient reports mild bruising on Tuesday that did resolve by today. Negative X-rays at [facility]. Pt has Hx of pain in cervical spine.   PAIN:  Pain Intensity: Present: 2/10, Best: 0/10, Worst: 4-5/10 Pain location: Medial elbow/medial epicondyle  Pain Quality:  Tiredness, sore to touch   Radiating: No  Numbness/Tingling: Yes, 4th-5th digit, some NT preceding this injury  Focal Weakness: No Aggravating factors: Direct touch/pressure on medial elbow, turning steering wheel, arm around his spouse  Relieving factors: Elbow sleeve, Acetaminophen, icing 24-hour pain behavior: None  History of prior shoulder or neck/shoulder injury, pain, surgery, or therapy: Yes, Hx of RTC-related pain in both shoulders, hx of stiff neck and improved neck pain with HEP (exercises given by Dr. Ashley Royalty) Falls: Has patient fallen in last 6 months? No, Number of falls: N/A Dominant hand:  left, pt plays golf right-handed  Imaging: Yes , Negative elbow radiographs at [facility]   Red flags (personal history of cancer, chills/fever, night sweats, nausea, vomiting, unrelenting pain): Negative  PRECAUTIONS: None  WEIGHT BEARING RESTRICTIONS: No   Living Environment Lives with: lives with their spouse Lives in: House/apartment   Prior level of function: Independent  Occupational demands: Retired   Presenter, broadcasting: Throwing baseball with son, golfing, hiking   Patient Goals: Back golfing, able to throw    OBJECTIVE:   Patient Surveys  FOTO: 6, predicted outcome score of 79   Posture Mild forward head, protracted scapulae  Cervical Screen AROM: WFL for all planes, exception of lateral flexion. Some L arm pain with flexion overpressure Spurlings A (ipsilateral lateral flexion/axial compression): R: Negative L: Positive for pain in neck Distraction Test: Negative  AROM AROM (Normal range in degrees) AROM 04/06/2023   Right Left  Shoulder    Flexion 150 150  Extension    Abduction [facility]  External Rotation [facility]  Internal Rotation 50 50  Hands Behind Head    Hands Behind Back        Elbow    Flexion WNL WNL  Extension WNL +3  Pronation WNL WNL  Supination WNL WNL  (* = pain; Blank rows = not tested)  UE MMT: MMT (out of 5) Right 04/06/2023 Left 04/06/2023      Shoulder   Flexion 5 5  Extension    Abduction 4+ 4* ( Pain in L shoulder)  External rotation 4+ 4* (Pain in L shoulder)  Internal rotation    Horizontal abduction    Horizontal adduction    Lower Trapezius    Rhomboids        Elbow  Flexion 5 5  Extension 5 5  Pronation  4*  Supination  5      Wrist  Flexion  4+  Extension  5  Radial deviation    Ulnar deviation    (* = pain; Blank rows = not tested)  No pain with end-range elbow extension   Sensation Deferred  Reflexes Deferred   Palpation TTP common flexor tendon L elbow 1+, medial epicondyle 1+ Graded on 0-4  scale (0 = no pain, 1 = pain, 2 = pain with wincing/grimacing/flinching, 3 = pain with withdrawal, 4 = unwilling to allow palpation), (Blank rows = not tested)    SPECIAL TESTS  Medial Epicondylitis Test: Positive  Ligamentous Tests Valgus Stress: Negative for pain or laxity Varus Stress: Negative for pain or laxity     TODAY'S TREATMENT    SUBJECTIVE STATEMENT: Pt reports doing well with chipping about 30 yards around his home Monday and Tuesday. Patient reports doing well with increasing weight with wrist curl; he attempted progressing to hatchet versus hammer with pronator eccentrics; this was too heavy.      Manual Therapy - for symptom modulation, soft tissue sensitivity and mobility, joint mobility, ROM   STM/DTM and IASTM with Edge Tool along wrist flexors/pronator teres; x 8 minutes Cross-friction massage along common wrist flexor tendon; x 2 minutes   Therapeutic Exercise - improved strength as needed to improve performance of CKC activities/functional movements, progressive loading to promote healing/remodeling of affected muscle-tendon group  Upper body ergometer, 2 minutes forward, 2 minutes backward - for tissue warm-up to improve muscle performance, improved soft tissue mobility/extensibility  -subjective gathered during portion of this time, 1 minute unbilled time   Resisted wrist flexion, with slow eccentric; 6-lb Dbell; 2x12 Hammer supination for eccentric loading of pronators; 2x12, 2-lb ankle weight strapped to end of hammer Standing single-arm Tband row with Black Tband; 2x12, 3 sec hold  -tactile and verbal cueing for scapular depression/retraction  Resisted shoulder adduction with Green Tband, anchored to 2nd hook from top; 2x12  D1 flexion, Green Tband; anchored on 2nd hook from bottom; x20 for each      PATIENT EDUCATION: We discussed continued progression of loading program at home. We discussed graded exposure to swinging golf club with  gradually increasing distance with anticipated return to full swing and gameplay in 4-week timeline given by his referring provider.     *not today* Wall bounce with arm in 90/90 position; 2-lb Medball; 2x15  -Attempted golf swing with chipping motion and full follow-through; x 5 attempts for either without reproduction of pain Resisted internal rotation walkout with Blue Tband; 1x12 with isometric hold of shoulder/elbow Isometric wrist flexion; x 10, 10 sec hold Isometric ulnar deviation; x 10, 10 sec hold   PATIENT EDUCATION:  Education details: see above for patient education details Person educated: Patient Education method: Explanation, Demonstration, and Handouts Education comprehension: verbalized understanding   HOME EXERCISE PROGRAM:  Access Code: [email or web address] URL: [email or web address] Date: 03/28/2023 Prepared by: Consuela Mimes  Exercises - Seated Isometric Wrist Flexion Supinated with Manual Resistance  - 2 x daily - 7 x weekly - 2 sets - 10 reps - 10sec hold - Seated Isometric Wrist Ulnar Deviation with Manual Resistance  - 2 x daily - 7 x weekly - 2 sets - 10 reps - 10sec hold - Seated Wrist Flexion with Dumbbell  - 2 x daily - 7 x weekly - 2 sets - 10 reps - Forearm Pronation and Supination with Hammer  - 2 x daily - 7 x weekly - 2 sets - 10 reps    ASSESSMENT:  CLINICAL IMPRESSION:  Patient demonstrates excellent motivation to progress with PT and work toward return to activity. He tolerates continued increase in loading intensity well without notable pain. Pt did try to progress to heavier pronator eccentric loading with hatchet and had some discomfort with this. All progressions in load completed in clinic have been successful to date. Patient has remaining deficits in wrist flexor/pronator strength, pain with gripping/twisting, and local nociceptive pain along medial elbow. Patient will benefit from continued skilled therapeutic intervention to  address the above deficits as needed for improved function and  QoL.    OBJECTIVE IMPAIRMENTS: decreased ROM, decreased strength, impaired UE functional use, and pain.   ACTIVITY LIMITATIONS: carrying, lifting, and reaching  PARTICIPATION LIMITATIONS:  playing golf, throwing baseball, hiking with walking poles  PERSONAL FACTORS: Age and 3+ comorbidities: (Hyperlipidemia, OA, pre-diabetes)  are also affecting patient's functional outcome.   REHAB POTENTIAL: Good  CLINICAL DECISION MAKING: Evolving/moderate complexity  EVALUATION COMPLEXITY: Moderate   GOALS: Goals reviewed with patient? Yes  SHORT TERM GOALS: Target date: 04/27/2023  Pt will be independent with HEP to improve strength and decrease neck pain to improve pain-free function at home and work. Baseline: 03/10/23: Baseline HEP initiated with simple wrist flexor and ulnar deviation isometrics.  Goal status: INITIAL   LONG TERM GOALS: Target date: 04/21/2023  Pt will increase FOTO to at least 79 to demonstrate significant improvement in function at home and work related to neck pain  Baseline: 03/10/23: 66 Goal status: INITIAL  2.  Pt will decrease worst elbow pain by at least 3 points on the NPRS in order to demonstrate clinically significant reduction in shoulder pain. Baseline: 03/10/23: Pain 5/10 at worst Goal status: INITIAL  3.  Pt will demonstrate golf swing with normal backswing and full follow-through with no pain reproduction as needed for full return to participation in golfing        Baseline: 03/10/23: Onset of pain with golf swing Goal status: INITIAL  4. Pt will increase strength by at least 1/2 MMT grade for wrist flexors and pronators and have no pain reproduction with muscle testing in order to demonstrate improvement in strength and function      Baseline: 03/10/23: 4-4+/5 Goal status: INITIAL   PLAN: PT FREQUENCY: 1-2x/week  PT DURATION: 6 weeks  PLANNED INTERVENTIONS: Therapeutic exercises,  Therapeutic activity, Neuromuscular re-education, Balance training, Gait training, Patient/Family education, Self Care, Joint mobilization, Joint manipulation, Vestibular training, Canalith repositioning, Orthotic/Fit training, DME instructions, Dry Needling, Electrical stimulation, Spinal manipulation, Spinal mobilization, Cryotherapy, Moist heat, Taping, Traction, Ultrasound, Ionotophoresis 4mg /ml Dexamethasone, Manual therapy, and Re-evaluation.  PLAN FOR NEXT SESSION: STM/DTM and isometrics for analgesic effect along wrist flexors/pronator teres. Consider dry needling as needed if pt has ongoing pain. Progressive loading and work into eccentrics as able.  Gradual return to activity over next 1-2 weeks.    Consuela Mimes, PT, DPT #[id number]  Gertie Exon, PT 04/06/2023, 12:39 PM

## 2023-04-09 ENCOUNTER — Encounter: Payer: Self-pay | Admitting: Physical Therapy

## 2023-04-12 ENCOUNTER — Ambulatory Visit: Payer: Medicare Other | Attending: Physician Assistant | Admitting: Physical Therapy

## 2023-04-12 DIAGNOSIS — M6281 Muscle weakness (generalized): Secondary | ICD-10-CM | POA: Diagnosis present

## 2023-04-12 DIAGNOSIS — M25522 Pain in left elbow: Secondary | ICD-10-CM | POA: Diagnosis present

## 2023-04-12 NOTE — Therapy (Signed)
OUTPATIENT PHYSICAL THERAPY TREATMENT  Patient Name: Jeff Mcmahon MRN: [id number] DOB:1945-10-14, 77 y.o., male Today's Date: 04/12/2023  END OF SESSION:  PT End of Session - 04/12/23 0933     Visit Number 7    Number of Visits 10    Date for PT Re-Evaluation 04/28/23    Authorization Type Medicare 2024    PT Start Time 0901    PT Stop Time 0944    PT Time Calculation (min) 43 min    Activity Tolerance Patient tolerated treatment well    Behavior During Therapy WFL for tasks assessed/performed               Past Medical History:  Diagnosis Date   Arthritis    Dysrhythmia 2007   H/O IRREGULAR HEART BEAT-SAW CARDIOLOGIST AND SAID IT WAS NOTHING TO BE CONCERNED WITH   GERD (gastroesophageal reflux disease)    H/O   Hyperlipidemia    Pre-diabetes    Past Surgical History:  Procedure Laterality Date   COLONOSCOPY     COLONOSCOPY WITH PROPOFOL N/A 06/02/2020   Procedure: COLONOSCOPY WITH PROPOFOL;  Surgeon: Toney Reil, MD;  Location: [facility] ENDOSCOPY;  Service: Gastroenterology;  Laterality: N/A;   ESOPHAGOGASTRODUODENOSCOPY     ESOPHAGOGASTRODUODENOSCOPY (EGD) WITH PROPOFOL N/A 09/02/2020   Procedure: ESOPHAGOGASTRODUODENOSCOPY (EGD) WITH PROPOFOL;  Surgeon: Toney Reil, MD;  Location: [facility] ENDOSCOPY;  Service: Gastroenterology;  Laterality: N/A;   EXCISION MASS NECK N/A 09/07/2016   Procedure: EXCISION MASS NECK;  Surgeon: Lattie Haw, MD;  Location: [facility] ORS;  Service: General;  Laterality: N/A;   EXCISION OF BACK LESION N/A 09/07/2016   Procedure: EXCISION OF BACK LESION;  Surgeon: Lattie Haw, MD;  Location: [facility] ORS;  Service: General;  Laterality: N/A;   MOUTH SURGERY     dental procedure   RETINAL LASER PROCEDURE Right 04/06/2021   [facility] in [place]   Patient Active Problem List   Diagnosis Date Noted   Retinal tear of right eye 12/21/2021   Dysphagia    Cervical spondylosis with radiculopathy 10/02/2015    Prediabetes 09/09/2015   Personal history of colonic polyps 09/05/2015   Hyperlipidemia 09/04/2015   GERD (gastroesophageal reflux disease) 09/04/2015   Dry eyes 09/04/2015    PCP: Duanne Limerick, MD  REFERRING PROVIDER: Rosilyn Mings, PA-C  REFERRING DIAG: M25.522 Pain in left elbow   RATIONALE FOR EVALUATION AND TREATMENT: Rehabilitation  THERAPY DIAG: Pain in left elbow  Muscle weakness (generalized)  ONSET DATE: 03/08/23   FOLLOW-UP APPT SCHEDULED WITH REFERRING PROVIDER: Yes , f/u at [facility] in one month    SUBJECTIVE:  PERTINENT HISTORY: Patient reports pain along L medial elbow with swinging golf club. Pain started on Tuesday, 03/08/23. He reports notable fatigability along his elbow. He reports variability of pain. He reports that acetaminophen is keeping pain under control. Patient reports some NT affecting 4th-5th digit. Patient reports mild bruising on Tuesday that did resolve by today. Negative X-rays at [facility]. Pt has Hx of pain in cervical spine.   PAIN:  Pain Intensity: Present: 2/10, Best: 0/10, Worst: 4-5/10 Pain location: Medial elbow/medial epicondyle  Pain Quality:  Tiredness, sore to touch   Radiating: No  Numbness/Tingling: Yes, 4th-5th digit, some NT preceding this injury  Focal Weakness: No Aggravating factors: Direct touch/pressure on medial elbow, turning steering wheel, arm around his spouse  Relieving factors: Elbow sleeve, Acetaminophen, icing 24-hour pain behavior: None  History of prior shoulder or neck/shoulder injury, pain, surgery, or therapy: Yes, Hx of RTC-related pain in both shoulders, hx of stiff neck and improved neck pain with HEP (exercises given by Dr. Ashley Royalty) Falls: Has patient fallen in last 6 months? No, Number of falls: N/A Dominant hand:  left, pt plays golf right-handed  Imaging: Yes , Negative elbow radiographs at [facility]   Red flags (personal history of cancer, chills/fever, night sweats, nausea, vomiting, unrelenting pain): Negative  PRECAUTIONS: None  WEIGHT BEARING RESTRICTIONS: No   Living Environment Lives with: lives with their spouse Lives in: House/apartment   Prior level of function: Independent  Occupational demands: Retired   Presenter, broadcasting: Throwing baseball with son, golfing, hiking   Patient Goals: Back golfing, able to throw    OBJECTIVE:   Patient Surveys  FOTO: 46, predicted outcome score of 79   Posture Mild forward head, protracted scapulae  Cervical Screen AROM: WFL for all planes, exception of lateral flexion. Some L arm pain with flexion overpressure Spurlings A (ipsilateral lateral flexion/axial compression): R: Negative L: Positive for pain in neck Distraction Test: Negative  AROM AROM (Normal range in degrees) AROM 04/12/2023   Right Left  Shoulder    Flexion 150 150  Extension    Abduction [facility]  External Rotation [facility]  Internal Rotation 50 50  Hands Behind Head    Hands Behind Back        Elbow    Flexion WNL WNL  Extension WNL +3  Pronation WNL WNL  Supination WNL WNL  (* = pain; Blank rows = not tested)  UE MMT: MMT (out of 5) Right 04/12/2023 Left 04/12/2023      Shoulder   Flexion 5 5  Extension    Abduction 4+ 4* ( Pain in L shoulder)  External rotation 4+ 4* (Pain in L shoulder)  Internal rotation    Horizontal abduction    Horizontal adduction    Lower Trapezius    Rhomboids        Elbow  Flexion 5 5  Extension 5 5  Pronation  4*  Supination  5      Wrist  Flexion  4+  Extension  5  Radial deviation    Ulnar deviation    (* = pain; Blank rows = not tested)  No pain with end-range elbow extension   Sensation Deferred  Reflexes Deferred   Palpation TTP common flexor tendon L elbow 1+, medial epicondyle 1+ Graded on 0-4  scale (0 = no pain, 1 = pain, 2 = pain with wincing/grimacing/flinching, 3 = pain with withdrawal, 4 = unwilling to allow palpation), (Blank rows = not tested)    SPECIAL TESTS  Medial Epicondylitis Test: Positive  Ligamentous Tests Valgus Stress: Negative for pain or laxity Varus Stress: Negative for pain or laxity     TODAY'S TREATMENT    SUBJECTIVE STATEMENT: Pt reports doing well with shipping up to 50 yards with quarter swing. He reports pain with full swing going 80+ yards with chipping wedge. Patient reports pain at arrival along medial elbow. Patient reports 2/10 pain at arrival to PT.      Manual Therapy - for symptom modulation, soft tissue sensitivity and mobility, joint mobility, ROM   STM/DTM along wrist flexors/pronator teres; x 8 minutes  Cross-friction massage along common wrist flexor tendon; x 3 minutes    Therapeutic Exercise - improved strength as needed to improve performance of CKC activities/functional movements, progressive loading to promote healing/remodeling of affected muscle-tendon group  Upper body ergometer, 2 minutes forward, 2 minutes backward - for tissue warm-up to improve muscle performance, improved soft tissue mobility/extensibility  -subjective gathered during portion of this time, 1 minute unbilled time   Hammer supination for eccentric loading of pronators; 2x12, 2-lb ankle weight strapped to end of hammer, long lever arm  Resisted Black Tband pronation with slow eccentric; 2x12  -demonstration and review of setup for proper carryover  Standing single-arm Nautilus row with 60lbs; 2x12, 3 sec hold  D1 flexion, Nautilus anchored at floor, 20-lbs;  2x10  Resisted shoulder adduction with Blue Tband, anchored to 2nd hook from top; 1x10 and 1x12   Wrist flexion eccentric; with Black Tband  -reviewed for HEP carryover (pt able to carry bands in luggage when going on vacation out of country)  -1x10 with slow eccentric  phase   PATIENT EDUCATION: We discussed continued loading program at home with gradual increase in intensity. We discussed shortening distance with "testing the waters" working on short-distance chipping and quarter golf swings.    *not today* Resisted wrist flexion, with slow eccentric; 6-lb Dbell; 2x12 Wall bounce with arm in 90/90 position; 2-lb Medball; 2x15  -Attempted golf swing with chipping motion and full follow-through; x 5 attempts for either without reproduction of pain Resisted internal rotation walkout with Blue Tband; 1x12 with isometric hold of shoulder/elbow Isometric wrist flexion; x 10, 10 sec hold Isometric ulnar deviation; x 10, 10 sec hold   PATIENT EDUCATION:  Education details: see above for patient education details Person educated: Patient Education method: Explanation, Demonstration, and Handouts Education comprehension: verbalized understanding   HOME EXERCISE PROGRAM:  Access Code: [email or web address] URL: [email or web address] Date: 03/28/2023 Prepared by: Consuela Mimes  Exercises - Seated Isometric Wrist Flexion Supinated with Manual Resistance  - 2 x daily - 7 x weekly - 2 sets - 10 reps - 10sec hold - Seated Isometric Wrist Ulnar Deviation with Manual Resistance  - 2 x daily - 7 x weekly - 2 sets - 10 reps - 10sec hold - Seated Wrist Flexion with Dumbbell  - 2 x daily - 7 x weekly - 2 sets - 10 reps - Forearm Pronation and Supination with Hammer  - 2 x daily - 7 x weekly - 2 sets - 10 reps    ASSESSMENT:  CLINICAL IMPRESSION:  Patient has been able to further progress distance with chipping and quarter swings up to 50 yards without pain. He reports performing full swing for up to approximately 80 yards distance and having delayed onset medial elbow pain with this; we discussed backing off on distance back to 30-50 yards with smaller-range swings based on symptomatic response. Pt is able to continue with  loading program in clinic including  increased emphasis on compound upper limb movements. We reviewed resisted Tband wrist flexion and pronation for carryover to the next 2 weeks when pt will be traveling out of the country. Patient has remaining deficits in wrist flexor/pronator strength, pain with gripping/twisting, and local nociceptive pain along medial elbow. Patient will benefit from continued skilled therapeutic intervention to address the above deficits as needed for improved function and QoL.    OBJECTIVE IMPAIRMENTS: decreased ROM, decreased strength, impaired UE functional use, and pain.   ACTIVITY LIMITATIONS: carrying, lifting, and reaching  PARTICIPATION LIMITATIONS:  playing golf, throwing baseball, hiking with walking poles  PERSONAL FACTORS: Age and 3+ comorbidities: (Hyperlipidemia, OA, pre-diabetes)  are also affecting patient's functional outcome.   REHAB POTENTIAL: Good  CLINICAL DECISION MAKING: Evolving/moderate complexity  EVALUATION COMPLEXITY: Moderate   GOALS: Goals reviewed with patient? Yes  SHORT TERM GOALS: Target date: 05/03/2023  Pt will be independent with HEP to improve strength and decrease neck pain to improve pain-free function at home and work. Baseline: 03/10/23: Baseline HEP initiated with simple wrist flexor and ulnar deviation isometrics.  Goal status: INITIAL   LONG TERM GOALS: Target date: 04/21/2023  Pt will increase FOTO to at least 79 to demonstrate significant improvement in function at home and work related to neck pain  Baseline: 03/10/23: 66 Goal status: INITIAL  2.  Pt will decrease worst elbow pain by at least 3 points on the NPRS in order to demonstrate clinically significant reduction in shoulder pain. Baseline: 03/10/23: Pain 5/10 at worst Goal status: INITIAL  3.  Pt will demonstrate golf swing with normal backswing and full follow-through with no pain reproduction as needed for full return to participation in golfing        Baseline: 03/10/23: Onset of pain with  golf swing Goal status: INITIAL  4. Pt will increase strength by at least 1/2 MMT grade for wrist flexors and pronators and have no pain reproduction with muscle testing in order to demonstrate improvement in strength and function      Baseline: 03/10/23: 4-4+/5 Goal status: INITIAL   PLAN: PT FREQUENCY: 1-2x/week  PT DURATION: 6 weeks  PLANNED INTERVENTIONS: Therapeutic exercises, Therapeutic activity, Neuromuscular re-education, Balance training, Gait training, Patient/Family education, Self Care, Joint mobilization, Joint manipulation, Vestibular training, Canalith repositioning, Orthotic/Fit training, DME instructions, Dry Needling, Electrical stimulation, Spinal manipulation, Spinal mobilization, Cryotherapy, Moist heat, Taping, Traction, Ultrasound, Ionotophoresis 4mg /ml Dexamethasone, Manual therapy, and Re-evaluation.  PLAN FOR NEXT SESSION: STM/DTM and isometrics for analgesic effect along wrist flexors/pronator teres. Consider dry needling as needed if pt has ongoing pain. Progressive loading and work into eccentrics as able.  Gradual return to activity over next 1-2 weeks.    Consuela Mimes, PT, DPT #[id number]  Gertie Exon, PT 04/12/2023, 9:44 AM

## 2023-04-14 ENCOUNTER — Ambulatory Visit: Payer: Medicare Other | Admitting: Physical Therapy

## 2023-04-14 DIAGNOSIS — M25522 Pain in left elbow: Secondary | ICD-10-CM | POA: Diagnosis not present

## 2023-04-14 DIAGNOSIS — M6281 Muscle weakness (generalized): Secondary | ICD-10-CM | POA: Diagnosis not present

## 2023-04-14 NOTE — Therapy (Addendum)
OUTPATIENT PHYSICAL THERAPY TREATMENT  Patient Name: Jeff Mcmahon MRN: [id number] DOB:1946/03/27, 77 y.o., male Today's Date: 04/14/2023  END OF SESSION:  PT End of Session - 04/14/23 0846     Visit Number 8    Number of Visits 10    Date for PT Re-Evaluation 04/28/23    Authorization Type Medicare 2024    PT Start Time 0901    PT Stop Time 0942    PT Time Calculation (min) 41 min    Activity Tolerance Patient tolerated treatment well    Behavior During Therapy WFL for tasks assessed/performed                Past Medical History:  Diagnosis Date   Arthritis    Dysrhythmia 2007   H/O IRREGULAR HEART BEAT-SAW CARDIOLOGIST AND SAID IT WAS NOTHING TO BE CONCERNED WITH   GERD (gastroesophageal reflux disease)    H/O   Hyperlipidemia    Pre-diabetes    Past Surgical History:  Procedure Laterality Date   COLONOSCOPY     COLONOSCOPY WITH PROPOFOL N/A 06/02/2020   Procedure: COLONOSCOPY WITH PROPOFOL;  Surgeon: Toney Reil, MD;  Location: [facility] ENDOSCOPY;  Service: Gastroenterology;  Laterality: N/A;   ESOPHAGOGASTRODUODENOSCOPY     ESOPHAGOGASTRODUODENOSCOPY (EGD) WITH PROPOFOL N/A 09/02/2020   Procedure: ESOPHAGOGASTRODUODENOSCOPY (EGD) WITH PROPOFOL;  Surgeon: Toney Reil, MD;  Location: [facility] ENDOSCOPY;  Service: Gastroenterology;  Laterality: N/A;   EXCISION MASS NECK N/A 09/07/2016   Procedure: EXCISION MASS NECK;  Surgeon: Lattie Haw, MD;  Location: [facility] ORS;  Service: General;  Laterality: N/A;   EXCISION OF BACK LESION N/A 09/07/2016   Procedure: EXCISION OF BACK LESION;  Surgeon: Lattie Haw, MD;  Location: [facility] ORS;  Service: General;  Laterality: N/A;   MOUTH SURGERY     dental procedure   RETINAL LASER PROCEDURE Right 04/06/2021   [facility] in [place]   Patient Active Problem List   Diagnosis Date Noted   Retinal tear of right eye 12/21/2021   Dysphagia    Cervical spondylosis with radiculopathy 10/02/2015    Prediabetes 09/09/2015   Personal history of colonic polyps 09/05/2015   Hyperlipidemia 09/04/2015   GERD (gastroesophageal reflux disease) 09/04/2015   Dry eyes 09/04/2015    PCP: Duanne Limerick, MD  REFERRING PROVIDER: Rosilyn Mings, PA-C  REFERRING DIAG: M25.522 Pain in left elbow   RATIONALE FOR EVALUATION AND TREATMENT: Rehabilitation  THERAPY DIAG: Pain in left elbow  Muscle weakness (generalized)  ONSET DATE: 03/08/23   FOLLOW-UP APPT SCHEDULED WITH REFERRING PROVIDER: Yes , f/u at [facility] in one month    SUBJECTIVE:  PERTINENT HISTORY: Patient reports pain along L medial elbow with swinging golf club. Pain started on Tuesday, 03/08/23. He reports notable fatigability along his elbow. He reports variability of pain. He reports that acetaminophen is keeping pain under control. Patient reports some NT affecting 4th-5th digit. Patient reports mild bruising on Tuesday that did resolve by today. Negative X-rays at [facility]. Pt has Hx of pain in cervical spine.   PAIN:  Pain Intensity: Present: 2/10, Best: 0/10, Worst: 4-5/10 Pain location: Medial elbow/medial epicondyle  Pain Quality:  Tiredness, sore to touch   Radiating: No  Numbness/Tingling: Yes, 4th-5th digit, some NT preceding this injury  Focal Weakness: No Aggravating factors: Direct touch/pressure on medial elbow, turning steering wheel, arm around his spouse  Relieving factors: Elbow sleeve, Acetaminophen, icing 24-hour pain behavior: None  History of prior shoulder or neck/shoulder injury, pain, surgery, or therapy: Yes, Hx of RTC-related pain in both shoulders, hx of stiff neck and improved neck pain with HEP (exercises given by Dr. Ashley Royalty) Falls: Has patient fallen in last 6 months? No, Number of falls: N/A Dominant hand:  left, pt plays golf right-handed  Imaging: Yes , Negative elbow radiographs at [facility]   Red flags (personal history of cancer, chills/fever, night sweats, nausea, vomiting, unrelenting pain): Negative  PRECAUTIONS: None  WEIGHT BEARING RESTRICTIONS: No   Living Environment Lives with: lives with their spouse Lives in: House/apartment   Prior level of function: Independent  Occupational demands: Retired   Presenter, broadcasting: Throwing baseball with son, golfing, hiking   Patient Goals: Back golfing, able to throw    OBJECTIVE:   Patient Surveys  FOTO: 82, predicted outcome score of 79   Posture Mild forward head, protracted scapulae  Cervical Screen AROM: WFL for all planes, exception of lateral flexion. Some L arm pain with flexion overpressure Spurlings A (ipsilateral lateral flexion/axial compression): R: Negative L: Positive for pain in neck Distraction Test: Negative  AROM AROM (Normal range in degrees) AROM 04/14/2023   Right Left  Shoulder    Flexion 150 150  Extension    Abduction [facility]  External Rotation [facility]  Internal Rotation 50 50  Hands Behind Head    Hands Behind Back        Elbow    Flexion WNL WNL  Extension WNL +3  Pronation WNL WNL  Supination WNL WNL  (* = pain; Blank rows = not tested)  UE MMT: MMT (out of 5) Right 03/10/2023 Left 03/10/2023      Shoulder   Flexion 5 5  Extension    Abduction 4+ 4* ( Pain in L shoulder)  External rotation 4+ 4* (Pain in L shoulder)  Internal rotation    Horizontal abduction    Horizontal adduction    Lower Trapezius    Rhomboids        Elbow  Flexion 5 5  Extension 5 5  Pronation  4*  Supination  5      Wrist  Flexion  4+  Extension  5  Radial deviation    Ulnar deviation    (* = pain; Blank rows = not tested)  No pain with end-range elbow extension   Sensation Deferred  Reflexes Deferred   Palpation TTP common flexor tendon L elbow 1+, medial epicondyle 1+ Graded on 0-4  scale (0 = no pain, 1 = pain, 2 = pain with wincing/grimacing/flinching, 3 = pain with withdrawal, 4 = unwilling to allow palpation), (Blank rows = not tested)    SPECIAL TESTS  Medial Epicondylitis Test: Positive  Ligamentous Tests Valgus Stress: Negative for pain or laxity Varus Stress: Negative for pain or laxity     TODAY'S TREATMENT    SUBJECTIVE STATEMENT: Pt reports no pain at arrival.  He will be going out of the country for next 2 weeks. He has not attempted golf swing/chipping again since last PT visit. He reports compliance with HEP and good tolerance of increased loads.      Manual Therapy - for symptom modulation, soft tissue sensitivity and mobility, joint mobility, ROM   STM/DTM along wrist flexors/pronator teres; x 8 minutes  Cross-friction massage along common wrist flexor tendon; x 3 minutes    Therapeutic Exercise - improved strength as needed to improve performance of CKC activities/functional movements, progressive loading to promote healing/remodeling of affected muscle-tendon group  Upper body ergometer, 2 minutes forward, 2 minutes backward - for tissue warm-up to improve muscle performance, improved soft tissue mobility/extensibility  -subjective gathered during portion of this time, 1 minute unbilled time   Hammer supination for eccentric loading of pronators; 2x15; 3-lb ankle weight strapped to end of hammer, long lever arm  Resisted Black Tband pronation with slow eccentric; reviewed for carryover to HEP   Standing single-arm row with Black Tband; 2x12, 3 sec hold  D1 flexion, Blue Tband anchored on door;  2x10   Wrist flexion eccentric; with Black Tband  -reviewed for HEP carryover (pt able to carry bands in luggage when going on vacation out of country)  -1x12 with slow eccentric phase  -for HEP review   PATIENT EDUCATION: We updated HEP and provided new MedBridge printout with Theraband alternatives to exercises.    *not  today* Resisted shoulder adduction with Blue Tband, anchored to 2nd hook from top; 1x10 and 1x12  Resisted wrist flexion, with slow eccentric; 6-lb Dbell; 2x12 Wall bounce with arm in 90/90 position; 2-lb Medball; 2x15  -Attempted golf swing with chipping motion and full follow-through; x 5 attempts for either without reproduction of pain Resisted internal rotation walkout with Blue Tband; 1x12 with isometric hold of shoulder/elbow Isometric wrist flexion; x 10, 10 sec hold Isometric ulnar deviation; x 10, 10 sec hold   PATIENT EDUCATION:  Education details: see above for patient education details Person educated: Patient Education method: Explanation, Demonstration, and Handouts Education comprehension: verbalized understanding   HOME EXERCISE PROGRAM:  Access Code: [email or web address] URL: [email or web address] Date: 04/14/2023 Prepared by: Consuela Mimes  Exercises - Seated Wrist Flexion with Dumbbell  - 2 x daily - 7 x weekly - 2 sets - 10 reps - Wrist Flexion with Resistance  - 2 x daily - 7 x weekly - 2 sets - 12-15 reps - Forearm Pronation and Supination with Hammer  - 2 x daily - 7 x weekly - 2 sets - 10 reps - Forearm Pronation with Resistance  - 2 x daily - 7 x weekly - 2 sets - 12-15 reps - Standing Single Arm Row with Resistance Thumb Up  - 1-2 x daily - 7 x weekly - 2 sets - 10 reps - Standing Single Arm Shoulder PNF D1 Flexion with Anchored Resistance  - 1-2 x daily - 7 x weekly - 2 sets - 10 reps - Standing Shoulder Single Arm PNF D2 Flexion with Resistance  - 1-2 x daily - 7 x weekly - 2 sets - 10 reps    ASSESSMENT:  CLINICAL IMPRESSION:  Patient's HEP was updated to include Theraband alternatives to progressive loading drills for carryover to patient's time  out of the country (pt able to travel with Tbands in luggage). Patient has most sensitivity along proximal pronator teres and common wrist flexor tendon. We are able to continue progression of  volume/load well without significant increase in pain acutely. Pt does have some low-level pain/aching after rest period today. We discussed parameters for progressive tendon loading with expectations for NPRS no more than 1-3/10 and for pain to resolve within the following 2-3 hr. Patient has remaining deficits in wrist flexor/pronator strength, pain with gripping/twisting, and local nociceptive pain along medial elbow. Patient will benefit from continued skilled therapeutic intervention to address the above deficits as needed for improved function and QoL.    OBJECTIVE IMPAIRMENTS: decreased ROM, decreased strength, impaired UE functional use, and pain.   ACTIVITY LIMITATIONS: carrying, lifting, and reaching  PARTICIPATION LIMITATIONS:  playing golf, throwing baseball, hiking with walking poles  PERSONAL FACTORS: Age and 3+ comorbidities: (Hyperlipidemia, OA, pre-diabetes)  are also affecting patient's functional outcome.   REHAB POTENTIAL: Good  CLINICAL DECISION MAKING: Evolving/moderate complexity  EVALUATION COMPLEXITY: Moderate   GOALS: Goals reviewed with patient? Yes  SHORT TERM GOALS: Target date: 05/05/2023  Pt will be independent with HEP to improve strength and decrease neck pain to improve pain-free function at home and work. Baseline: 03/10/23: Baseline HEP initiated with simple wrist flexor and ulnar deviation isometrics.  Goal status: INITIAL   LONG TERM GOALS: Target date: 04/21/2023  Pt will increase FOTO to at least 79 to demonstrate significant improvement in function at home and work related to neck pain  Baseline: 03/10/23: 66 Goal status: INITIAL  2.  Pt will decrease worst elbow pain by at least 3 points on the NPRS in order to demonstrate clinically significant reduction in shoulder pain. Baseline: 03/10/23: Pain 5/10 at worst Goal status: INITIAL  3.  Pt will demonstrate golf swing with normal backswing and full follow-through with no pain reproduction as  needed for full return to participation in golfing        Baseline: 03/10/23: Onset of pain with golf swing Goal status: INITIAL  4. Pt will increase strength by at least 1/2 MMT grade for wrist flexors and pronators and have no pain reproduction with muscle testing in order to demonstrate improvement in strength and function      Baseline: 03/10/23: 4-4+/5 Goal status: INITIAL   PLAN: PT FREQUENCY: 1-2x/week  PT DURATION: 6 weeks  PLANNED INTERVENTIONS: Therapeutic exercises, Therapeutic activity, Neuromuscular re-education, Balance training, Gait training, Patient/Family education, Self Care, Joint mobilization, Joint manipulation, Vestibular training, Canalith repositioning, Orthotic/Fit training, DME instructions, Dry Needling, Electrical stimulation, Spinal manipulation, Spinal mobilization, Cryotherapy, Moist heat, Taping, Traction, Ultrasound, Ionotophoresis 4mg /ml Dexamethasone, Manual therapy, and Re-evaluation.  PLAN FOR NEXT SESSION: STM/DTM and isometrics for analgesic effect along wrist flexors/pronator teres. Progressive loading and work into eccentrics as able.  Gradual return to activity following patient's 2-week vacation.    Consuela Mimes, PT, DPT #[id number]  Gertie Exon, PT 04/14/2023, 9:02 AM

## 2023-04-18 ENCOUNTER — Encounter: Payer: Self-pay | Admitting: Physical Therapy

## 2023-04-27 ENCOUNTER — Telehealth: Payer: Self-pay | Admitting: Family Medicine

## 2023-04-27 NOTE — Telephone Encounter (Signed)
Copied from CRM [id number]. Topic: Medicare AWV >> Apr 27, 2023 10:12 AM Payton Doughty wrote: Reason for CRM: LVM 04/27/23 AWV time change from 10am to 945am, please confirm time change khc  Verlee Rossetti; Care Guide Ambulatory Clinical Support [place] [facility] Direct Dial: [phone]

## 2023-05-03 DIAGNOSIS — Z23 Encounter for immunization: Secondary | ICD-10-CM | POA: Diagnosis not present

## 2023-05-04 ENCOUNTER — Ambulatory Visit: Payer: Medicare Other | Admitting: Physical Therapy

## 2023-05-04 DIAGNOSIS — M6281 Muscle weakness (generalized): Secondary | ICD-10-CM | POA: Diagnosis not present

## 2023-05-04 DIAGNOSIS — M25522 Pain in left elbow: Secondary | ICD-10-CM

## 2023-05-09 ENCOUNTER — Encounter: Payer: Self-pay | Admitting: Physical Therapy

## 2023-05-11 ENCOUNTER — Ambulatory Visit: Payer: Medicare Other | Attending: Physician Assistant | Admitting: Physical Therapy

## 2023-05-11 ENCOUNTER — Ambulatory Visit: Payer: Medicare Other

## 2023-05-11 VITALS — BP 120/60 | Ht 70.0 in | Wt 189.4 lb

## 2023-05-11 DIAGNOSIS — M25522 Pain in left elbow: Secondary | ICD-10-CM | POA: Insufficient documentation

## 2023-05-11 DIAGNOSIS — Z0000 Encounter for general adult medical examination without abnormal findings: Secondary | ICD-10-CM

## 2023-05-11 DIAGNOSIS — M6281 Muscle weakness (generalized): Secondary | ICD-10-CM | POA: Diagnosis not present

## 2023-05-11 NOTE — Therapy (Signed)
OUTPATIENT PHYSICAL THERAPY TREATMENT AND PROGRESS NOTE   Dates of reporting period  03/10/23   to   05/11/23   Patient Name: Jeff Mcmahon MRN: [id number] DOB:[birth year], 77 y.o., male Today's Date: 05/11/2023  END OF SESSION:  PT End of Session - 05/11/23 1416     Visit Number 10    Number of Visits 10    Date for PT Re-Evaluation 04/28/23    Authorization Type Medicare 2024    PT Start Time 1411    PT Stop Time 1445    PT Time Calculation (min) 34 min    Activity Tolerance Patient tolerated treatment well    Behavior During Therapy WFL for tasks assessed/performed             Past Medical History:  Diagnosis Date   Arthritis    Dysrhythmia 2007   H/O IRREGULAR HEART BEAT-SAW CARDIOLOGIST AND SAID IT WAS NOTHING TO BE CONCERNED WITH   GERD (gastroesophageal reflux disease)    H/O   Hyperlipidemia    Pre-diabetes    Past Surgical History:  Procedure Laterality Date   COLONOSCOPY     COLONOSCOPY WITH PROPOFOL N/A 06/02/2020   Procedure: COLONOSCOPY WITH PROPOFOL;  Surgeon: Toney Reil, MD;  Location: [facility] ENDOSCOPY;  Service: Gastroenterology;  Laterality: N/A;   ESOPHAGOGASTRODUODENOSCOPY     ESOPHAGOGASTRODUODENOSCOPY (EGD) WITH PROPOFOL N/A 09/02/2020   Procedure: ESOPHAGOGASTRODUODENOSCOPY (EGD) WITH PROPOFOL;  Surgeon: Toney Reil, MD;  Location: [facility] ENDOSCOPY;  Service: Gastroenterology;  Laterality: N/A;   EXCISION MASS NECK N/A 09/07/2016   Procedure: EXCISION MASS NECK;  Surgeon: Lattie Haw, MD;  Location: [facility] ORS;  Service: General;  Laterality: N/A;   EXCISION OF BACK LESION N/A 09/07/2016   Procedure: EXCISION OF BACK LESION;  Surgeon: Lattie Haw, MD;  Location: [facility] ORS;  Service: General;  Laterality: N/A;   MOUTH SURGERY     dental procedure   RETINAL LASER PROCEDURE Right 04/06/2021   [facility] in [place]   Patient Active Problem List   Diagnosis Date Noted   Retinal tear of right eye 12/21/2021    Dysphagia    Cervical spondylosis with radiculopathy 10/02/2015   Prediabetes 09/09/2015   History of colonic polyps 09/05/2015   Hyperlipidemia 09/04/2015   GERD (gastroesophageal reflux disease) 09/04/2015   Dry eyes 09/04/2015    PCP: Duanne Limerick, MD  REFERRING PROVIDER: Rosilyn Mings, PA-C  REFERRING DIAG: M25.522 Pain in left elbow   RATIONALE FOR EVALUATION AND TREATMENT: Rehabilitation  THERAPY DIAG: Pain in left elbow  Muscle weakness (generalized)  ONSET DATE: 03/08/23    SUBJECTIVE:  PERTINENT HISTORY: Patient reports pain along L medial elbow with swinging golf club. Pain started on Tuesday, 03/08/23. He reports notable fatigability along his elbow. He reports variability of pain. He reports that acetaminophen is keeping pain under control. Patient reports some NT affecting 4th-5th digit. Patient reports mild bruising on Tuesday that did resolve by today (date of eval). Negative X-rays at [facility]. Pt has Hx of pain in cervical spine.   PAIN:  Pain Intensity: Present: 2/10, Best: 0/10, Worst: 4-5/10 Pain location: Medial elbow/medial epicondyle  Pain Quality:  Tiredness, sore to touch   Radiating: No  Numbness/Tingling: Yes, 4th-5th digit, some NT preceding this injury  Focal Weakness: No Aggravating factors: Direct touch/pressure on medial elbow, turning steering wheel, arm around his spouse  Relieving factors: Elbow sleeve, Acetaminophen, icing 24-hour pain behavior: None  History of prior shoulder or neck/shoulder injury, pain, surgery, or therapy: Yes, Hx of RTC-related pain in both shoulders, hx of stiff neck and improved neck pain with HEP (exercises given by Dr. Ashley Royalty) Falls: Has patient fallen in last 6 months? No, Number of falls: N/A Dominant hand: left, pt plays  golf right-handed  Imaging: Yes , Negative elbow radiographs at [facility]   Red flags (personal history of cancer, chills/fever, night sweats, nausea, vomiting, unrelenting pain): Negative  PRECAUTIONS: None  WEIGHT BEARING RESTRICTIONS: No   Living Environment Lives with: lives with their spouse Lives in: House/apartment   Prior level of function: Independent  Occupational demands: Retired   Presenter, broadcasting: Throwing baseball with son, golfing, hiking   Patient Goals: Back golfing, able to throw    OBJECTIVE:   Patient Surveys  FOTO: 71, predicted outcome score of 79   Posture Mild forward head, protracted scapulae  Cervical Screen AROM: WFL for all planes, exception of lateral flexion. Some L arm pain with flexion overpressure Spurlings A (ipsilateral lateral flexion/axial compression): R: Negative L: Positive for pain in neck Distraction Test: Negative  AROM AROM (Normal range in degrees) AROM 04/14/2023   Right Left  Shoulder    Flexion 150 150  Extension    Abduction [facility]  External Rotation [facility]  Internal Rotation 50 50  Hands Behind Head    Hands Behind Back        Elbow    Flexion WNL WNL  Extension WNL +3  Pronation WNL WNL  Supination WNL WNL  (* = pain; Blank rows = not tested)  UE MMT: MMT (out of 5) Right 03/10/2023 Left 03/10/2023 Right 05/11/23 Left 05/11/23        Shoulder     Flexion 5 5 5 5   Extension      Abduction 4+ 4* ( Pain in L shoulder) 5 5 (lateral elbow pain)  External rotation 4+ 4* (Pain in L shoulder) 4 4+  Internal rotation      Horizontal abduction      Horizontal adduction      Lower Trapezius      Rhomboids            Elbow    Flexion 5 5 5 5   Extension 5 5 5 5   Pronation  4* 5 5  Supination  5 5 5         Wrist    Flexion  4+  5  Extension  5  5  Radial deviation      Ulnar deviation      (* = pain; Blank rows = not tested)  No pain with end-range elbow  extension  Sensation Deferred  Reflexes Deferred   Palpation TTP common flexor tendon L elbow 1+, medial epicondyle 1+ Graded on 0-4 scale (0 = no pain, 1 = pain, 2 = pain with wincing/grimacing/flinching, 3 = pain with withdrawal, 4 = unwilling to allow palpation), (Blank rows = not tested)    SPECIAL TESTS  Medial Epicondylitis Test: Positive  Ligamentous Tests Valgus Stress: Negative for pain or laxity Varus Stress: Negative for pain or laxity     TODAY'S TREATMENT    SUBJECTIVE STATEMENT: Patient reports progressing with throwing at increasing distances without reproduction of pain. Pain 1/10 at most over previous week when picking up vacuum cleaner. He reports being able to play short round of golf with completion of 4 holes without reproduction of pain. He is curious about progressing from 6 iron to 7 iron over the following week. Pt reports no significant flare-ups since last visit and compliance with HEP.      Therapeutic Exercise - improved strength as needed to improve performance of CKC activities/functional movements, progressive loading to promote healing/remodeling of affected muscle-tendon group   *GOAL UPDATE PERFORMED   Upper body ergometer, 2 minutes forward, 2 minutes backward - for tissue warm-up to improve muscle performance, improved soft tissue mobility/extensibility  -subjective gathered during this time   Trial of golf swing; completed chipping motion and full swing without significant reproduction of symptoms  Trial of overhead throwing at 20 ft; completed x 10 with PT with no reproduction of symptoms  Plyoboard toss with 2.2-lb medball; x15 with 90/90 overhead throw   PATIENT EDUCATION: We discussed progression of load/intensity and gradual progression of volume of swinging golf club as well as distance of throwing per graded return to throwing guidelines. We encouraged continued HEP with progression of loads as tolerated and gradual  resumption of activities (golf, throwing, pickleball).     *not today* Hammer supination for eccentric loading of pronators; 2x12; 4-lb ankle weight strapped to end of hammer, long lever arm Resisted wrist flexion, with slow eccentric; 9-lb Dbell; 2x10  Standing single-arm row with Nautilus; 1x10 with 60lb, 1x10 with 70-lb D1 flexion, Nautilus at lowest level; 2x12 Resisted Black Tband pronation with slow eccentric; reviewed for carryover to HEP Wrist flexion eccentric; with Black Tband  -1x12 with slow eccentric phase Resisted shoulder adduction with Blue Tband, anchored to 2nd hook from top; 1x10 and 1x12  Wall bounce with arm in 90/90 position; 2-lb Medball; 2x15  -Attempted golf swing with chipping motion and full follow-through; x 5 attempts for either without reproduction of pain Resisted internal rotation walkout with Blue Tband; 1x12 with isometric hold of shoulder/elbow Isometric wrist flexion; x 10, 10 sec hold Isometric ulnar deviation; x 10, 10 sec hold   PATIENT EDUCATION:  Education details: see above for patient education details Person educated: Patient Education method: Explanation, Demonstration, and Handouts Education comprehension: verbalized understanding   HOME EXERCISE PROGRAM:  Access Code: [email or web address] URL: [email or web address] Date: 04/14/2023 Prepared by: Consuela Mimes  Exercises - Seated Wrist Flexion with Dumbbell  - 2 x daily - 7 x weekly - 2 sets - 10 reps - Wrist Flexion with Resistance  - 2 x daily - 7 x weekly - 2 sets - 12-15 reps - Forearm Pronation and Supination with Hammer  - 2 x daily - 7 x weekly - 2 sets - 10 reps - Forearm Pronation with Resistance  - 2 x daily - 7 x weekly - 2 sets - 12-15 reps - Standing Single Arm Row with  Resistance Thumb Up  - 1-2 x daily - 7 x weekly - 2 sets - 10 reps - Standing Single Arm Shoulder PNF D1 Flexion with Anchored Resistance  - 1-2 x daily - 7 x weekly - 2 sets - 10 reps - Standing  Shoulder Single Arm PNF D2 Flexion with Resistance  - 1-2 x daily - 7 x weekly - 2 sets - 10 reps    ASSESSMENT:  CLINICAL IMPRESSION: Patient has not experienced significant flare-up of symptoms with return to activity. He has gradually been able to increase load and volume with swinging golf club as well as throwing. Pt does not have significant pain today; he only experienced brief episode of pain with carrying household equipment with bilat UE. He has met nearly all goals with exception of FOTO (some activities rated lower due to pt not trying certain tasks yet). Pt has excellent MMTs without reproduction of symptoms. Low NPRS over previous week with gradual re-introduction of desired recreational activities. Pt is appropriate for continued home exercise program and graded return to activity. We discussed at length parameters and guidelines for progressing exercises and for return to activity. Pt is about ready for discharge; we will send FOTO electronically over next 2 weeks and update this final goal.    OBJECTIVE IMPAIRMENTS: decreased ROM, decreased strength, impaired UE functional use, and pain.   ACTIVITY LIMITATIONS: carrying, lifting, and reaching  PARTICIPATION LIMITATIONS:  playing golf, throwing baseball, hiking with walking poles  PERSONAL FACTORS: Age and 3+ comorbidities: (Hyperlipidemia, OA, pre-diabetes)  are also affecting patient's functional outcome.   REHAB POTENTIAL: Good  CLINICAL DECISION MAKING: Evolving/moderate complexity  EVALUATION COMPLEXITY: Moderate   GOALS: Goals reviewed with patient? Yes  SHORT TERM GOALS: Target date: 04/03/2023  Pt will be independent with HEP to improve strength and decrease neck pain to improve pain-free function at home and work. Baseline: 03/10/23: Baseline HEP initiated with simple wrist flexor and ulnar deviation isometrics.       05/11/23: Pt verbalizes understanding of exercises and has independently progressed load/volume.   Goal status: ACHIEVED   LONG TERM GOALS: Target date: 04/21/2023  Pt will increase FOTO to at least 79 to demonstrate significant improvement in function at home and work related to neck pain  Baseline: 03/10/23: 66.           05/11/23: 76/79 Goal status: IN PROGRESS  2.  Pt will decrease worst elbow pain by at least 3 points on the NPRS in order to demonstrate clinically significant reduction in shoulder pain. Baseline: 03/10/23: Pain 5/10 at worst.           05/11/23: 1/10 at worst.  Goal status: ACHIEVED   3.  Pt will demonstrate golf swing with normal backswing and full follow-through with no pain reproduction as needed for full return to participation in golfing        Baseline: 03/10/23: Onset of pain with golf swing.         05/11/23: Demonstrated without significant pain.  Goal status: ACHIEVED  4. Pt will increase strength by at least 1/2 MMT grade for wrist flexors and pronators and have no pain reproduction with muscle testing in order to demonstrate improvement in strength and function      Baseline: 03/10/23: 4-4+/5.        05/11/23: Met for both with no reproduction of symptoms today (see table above) Goal status: ACHIEVED   PLAN: PT FREQUENCY: -  PT DURATION: -  PLANNED INTERVENTIONS: Therapeutic exercises, Therapeutic  activity, Neuromuscular re-education, Balance training, Gait training, Patient/Family education, Self Care, Joint mobilization, Joint manipulation, Vestibular training, Canalith repositioning, Orthotic/Fit training, DME instructions, Dry Needling, Electrical stimulation, Spinal manipulation, Spinal mobilization, Cryotherapy, Moist heat, Taping, Traction, Ultrasound, Ionotophoresis 4mg /ml Dexamethasone, Manual therapy, and Re-evaluation.  PLAN FOR NEXT SESSION: Pt to continue with graded loading program and gradual resumption of recreational activities and throwing with slow increases in volume and intensity. Pt to continued with home-based loading program.     Consuela Mimes, PT, DPT #[id number]  Gertie Exon, PT 05/11/2023, 2:52 PM

## 2023-05-11 NOTE — Progress Notes (Signed)
Subjective:   Jeff Mcmahon is a 77 y.o. male who presents for Medicare Annual/Subsequent preventive examination.  Visit Complete: In person  Cardiac Risk Factors include: advanced age (>10men, >44 women);male gender;dyslipidemia     Objective:    Today's Vitals   05/11/23 0934  BP: 120/60  Weight: 189 lb 6.4 oz (85.9 kg)  Height: 5\' 10"  (1.778 m)   Body mass index is 27.18 kg/m.     05/11/2023    9:45 AM 04/14/2022   10:08 AM 04/08/2021   10:30 AM 01/04/2021    8:18 AM 09/02/2020    8:33 AM 06/02/2020    7:40 AM 04/07/2020   10:46 AM  Advanced Directives  Does Patient Have a Medical Advance Directive? Yes Yes Yes No Yes Yes Yes  Type of Estate agent of [place];Living will  Healthcare Power of [place];Living will  Living will  Healthcare Power of [place];Living will  Does patient want to make changes to medical advance directive? No - Patient declined No - Patient declined       Copy of Healthcare Power of Attorney in Chart? Yes - validated most recent copy scanned in chart (See row information)  Yes - validated most recent copy scanned in chart (See row information)    Yes - validated most recent copy scanned in chart (See row information)    Current Medications (verified) Outpatient Encounter Medications as of 05/11/2023  Medication Sig   acetaminophen (TYLENOL) 500 MG tablet Take 500 mg by mouth every 6 (six) hours as needed.   diclofenac Sodium (VOLTAREN) 1 % GEL Apply topically 4 (four) times daily.   famotidine (PEPCID) 40 MG tablet Take 1 tablet (40 mg total) by mouth daily.   Multiple Vitamin (MULTIVITAMIN WITH MINERALS) TABS tablet Take 1 tablet by mouth daily.   Polyethyl Glycol-Propyl Glycol (SYSTANE) 0.4-0.3 % SOLN Apply to eye.   psyllium (METAMUCIL) 58.6 % powder Take 1 packet by mouth 3 (three) times daily.   sodium chloride (OCEAN) 0.65 % SOLN nasal spray Place 1 spray into both nostrils as needed for congestion.    triamcinolone  (NASACORT) 55 MCG/ACT AERO nasal inhaler Place 2 sprays into the nose daily. Dr. Elenore Rota   [DISCONTINUED] Misc Natural Products (GLUCOSAMINE CHONDROITIN ADV) TABS Take by mouth.   [DISCONTINUED] Omega-3 Fatty Acids (FISH OIL) 1000 MG CAPS Take 1 capsule (1,000 mg total) by mouth daily.   No facility-administered encounter medications on file as of 05/11/2023.    Allergies (verified) Patient has no known allergies.   History: Past Medical History:  Diagnosis Date   Arthritis    Dysrhythmia 2007   H/O IRREGULAR HEART BEAT-SAW CARDIOLOGIST AND SAID IT WAS NOTHING TO BE CONCERNED WITH   GERD (gastroesophageal reflux disease)    H/O   Hyperlipidemia    Pre-diabetes    Past Surgical History:  Procedure Laterality Date   COLONOSCOPY     COLONOSCOPY WITH PROPOFOL N/A 06/02/2020   Procedure: COLONOSCOPY WITH PROPOFOL;  Surgeon: Toney Reil, MD;  Location: [facility] ENDOSCOPY;  Service: Gastroenterology;  Laterality: N/A;   ESOPHAGOGASTRODUODENOSCOPY     ESOPHAGOGASTRODUODENOSCOPY (EGD) WITH PROPOFOL N/A 09/02/2020   Procedure: ESOPHAGOGASTRODUODENOSCOPY (EGD) WITH PROPOFOL;  Surgeon: Toney Reil, MD;  Location[facility] END[facility]COPY;  Service: Gastroenterology;  Laterality: N/A;   EXCISION MASS NECK N/A 09/07/2016   Procedure: EXCISION MASS NECK;  Surgeon: Lattie Haw, MD;  Location: [facility] ORS;  Service: General;  Laterality: N/A;   EXCISION OF BACK LESION N/A 09/07/2016  Procedure: EXCISION OF BACK LESION;  Surgeon: Lattie Haw, MD;  Location: [facility] ORS;  Service: General;  Laterality: N/A;   MOUTH SURGERY     dental procedure   RETINAL LASER PROCEDURE Right 04/06/2021   [facility] in [place]   Family History  Problem Relation Age of Onset   Lymphoma Mother 25   Hypertension Mother    Cancer Father        lung   Alcohol abuse Father    Mental illness Father    Heart disease Brother    Social History   Socioeconomic History   Marital status:  Married    Spouse name: Not on file   Number of children: 2   Years of education: Not on file   Highest education level: Professional school degree (e.g., MD, DDS, DVM, JD)  Occupational History   Occupation: Retired  Tobacco Use   Smoking status: Never   Smokeless tobacco: Never   Tobacco comments:    smoking cessation materials not required  Vaping Use   Vaping status: Never Used  Substance and Sexual Activity   Alcohol use: Yes    Comment: social   Drug use: No   Sexual activity: Not Currently  Other Topics Concern   Not on file  Social History Narrative   Not on file   Social Determinants of Health   Financial Resource Strain: Low Risk  (05/11/2023)   Overall Financial Resource Strain (CARDIA)    Difficulty of Paying Living Expenses: Not hard at all  Food Insecurity: No Food Insecurity (05/11/2023)   Hunger Vital Sign    Worried About Running Out of Food in the Last Year: Never true    Ran Out of Food in the Last Year: Never true  Transportation Needs: No Transportation Needs (05/11/2023)   PRAPARE - Administrator, Civil Service (Medical): No    Lack of Transportation (Non-Medical): No  Physical Activity: Sufficiently Active (05/11/2023)   Exercise Vital Sign    Days of Exercise per Week: 6 days    Minutes of Exercise per Session: 60 min  Stress: No Stress Concern Present (05/11/2023)   [employer] of Occupational Health - Occupational Stress Questionnaire    Feeling of Stress : Not at all  Social Connections: Socially Integrated (05/11/2023)   Social Connection and Isolation Panel [NHANES]    Frequency of Communication with Friends and Family: More than three times a week    Frequency of Social Gatherings with Friends and Family: Twice a week    Attends Religious Services: More than 4 times per year    Active Member of [employer] or Organizations: Yes    Attends Engineer, structural: More than 4 times per year    Marital Status: Married     Tobacco Counseling Counseling given: Not Answered Tobacco comments: smoking cessation materials not required   Clinical Intake:  Pre-visit preparation completed: Yes  Pain : No/denies pain     Nutritional Status: BMI 25 -29 Overweight Nutritional Risks: None Diabetes: No  How often do you need to have someone help you when you read instructions, pamphlets, or other written materials from your doctor or pharmacy?: 1 - Never  Interpreter Needed?: No  Information entered by :: Kennedy Bucker, LPN   Activities of Daily Living    05/11/2023    9:46 AM 05/07/2023    8:53 AM  In your present state of health, do you have any difficulty performing the following activities:  Hearing? 0 0  Vision? 0 0  Difficulty concentrating or making decisions? 0 0  Walking or climbing stairs? 0 0  Dressing or bathing? 0 0  Doing errands, shopping? 0 0  Preparing Food and eating ? N N  Using the Toilet? N N  In the past six months, have you accidently leaked urine? N N  Do you have problems with loss of bowel control? N N  Managing your Medications? N N  Managing your Finances? N N  Housekeeping or managing your Housekeeping? N N    Patient Care Team: Duanne Limerick, MD as PCP - General (Family Medicine) Dasher, Cliffton Asters, MD as Consulting Physician (Dermatology) Vernie Murders, MD (Otolaryngology) Toney Reil, MD as Consulting Physician (Gastroenterology)  Indicate any recent Medical Services you may have received from other than [facility] providers in the past year (date may be approximate).     Assessment:   This is a routine wellness examination for Keri.  Hearing/Vision screen Hearing Screening - Comments:: No aids Vision Screening - Comments:: Wears glasses- [facility]   Goals Addressed             This Visit's Progress    DIET - EAT MORE FRUITS AND VEGETABLES         Depression Screen    05/11/2023    9:43 AM 12/22/2022    7:50 AM 08/11/2022    9:35 AM  06/23/2022    8:07 AM 04/14/2022   10:09 AM 03/23/2022    4:18 PM 06/22/2021    8:50 AM  PHQ 2/9 Scores  PHQ - 2 Score 0 0 0 0 0 0 0  PHQ- 9 Score 0 0 0 0 0 0 0    Fall Risk    05/11/2023    9:46 AM 05/07/2023    8:53 AM 12/22/2022    7:50 AM 08/11/2022    9:35 AM 06/23/2022    8:06 AM  Fall Risk   Falls in the past year? 1 1 0 0 0  Number falls in past yr: 0 0 0 0 0  Injury with Fall? 0 0 0 0 0  Risk for fall due to : History of fall(s)  No Fall Risks No Fall Risks No Fall Risks  Follow up Falls evaluation completed;Falls prevention discussed  Falls evaluation completed Falls evaluation completed Falls evaluation completed    MEDICARE RISK AT HOME: Medicare Risk at Home Any stairs in or around the home?: Yes If so, are there any without handrails?: No Home free of loose throw rugs in walkways, pet beds, electrical cords, etc?: Yes Adequate lighting in your home to reduce risk of falls?: Yes Life alert?: No Use of a cane, walker or w/c?: No Grab bars in the bathroom?: No Shower chair or bench in shower?: No Elevated toilet seat or a handicapped toilet?: Yes  TIMED UP AND GO:  Was the test performed?  Yes  Length of time to ambulate 10 feet: 4 sec Gait steady and fast without use of assistive device    Cognitive Function:    12/21/2021    8:20 AM  MMSE - Mini Mental State Exam  Orientation to time 5  Orientation to Place 5  Registration 3  Attention/ Calculation 5  Recall 3  Language- name 2 objects 2  Language- repeat 1  Language- follow 3 step command 3  Language- read & follow direction 1  Write a sentence 1  Copy design 1  Total score 30  05/11/2023    9:47 AM 04/14/2022   10:10 AM 04/07/2020   10:48 AM 04/04/2019   10:38 AM 02/22/2018    9:03 AM  6CIT Screen  What Year? 0 points 0 points 0 points 0 points 0 points  What month? 0 points 0 points 0 points 0 points 0 points  What time? 0 points 0 points 0 points 0 points 0 points  Count back from 20 0  points 0 points 0 points 0 points 0 points  Months in reverse 0 points 0 points 0 points 0 points 0 points  Repeat phrase 0 points 0 points 0 points 0 points 0 points  Total Score 0 points 0 points 0 points 0 points 0 points    Immunizations Immunization History  Administered Date(s) Administered   Fluad Quad(high Dose 65+) 04/28/2022   Influenza Split 05/03/2023   Influenza, High Dose Seasonal PF 04/10/2017, 04/15/2019   Influenza-Unspecified 04/09/2018, 04/22/2020, 04/15/2021   PFIZER(Purple Top)SARS-COV-2 Vaccination 08/29/2019, 09/26/2019, 05/09/2020, 11/05/2020, 04/15/2021   PNEUMOCOCCAL CONJUGATE-20 06/23/2022   Pfizer Covid-19 Vaccine Bivalent Booster 35yrs & up 12/07/2021   Pfizer(Comirnaty)Fall Seasonal Vaccine 12 years and older 11/17/2022   Pneumococcal Conjugate-13 09/04/2011   Pneumococcal Polysaccharide-23 09/04/2015   Tdap 07/23/2018   Zoster Recombinant(Shingrix) 02/01/2017, 04/10/2017   Zoster, Live 09/04/2015    TDAP status: Up to date  Flu Vaccine status: Up to date  Pneumococcal vaccine status: Up to date  Covid-19 vaccine status: Completed vaccines  Qualifies for Shingles Vaccine? Yes   Zostavax completed Yes   Shingrix Completed?: Yes  Screening Tests Health Maintenance  Topic Date Due   COVID-19 Vaccine (8 - 2023-24 season) 04/10/2023   Medicare Annual Wellness (AWV)  05/10/2024   Colonoscopy  06/02/2025   DTaP/Tdap/Td (2 - Td or Tdap) 07/23/2028   Pneumonia Vaccine 42+ Years old  Completed   INFLUENZA VACCINE  Completed   Hepatitis C Screening  Completed   Zoster Vaccines- Shingrix  Completed   HPV VACCINES  Aged Out    Health Maintenance  Health Maintenance Due  Topic Date Due   COVID-19 Vaccine (8 - 2023-24 season) 04/10/2023    Colorectal cancer screening: Type of screening: Colonoscopy. Completed 06/02/20. Repeat every 5 years  Lung Cancer Screening: (Low Dose CT Chest recommended if Age 23-80 years, 20 pack-year currently smoking  OR have quit w/in 15years.) does not qualify.    Additional Screening:  Hepatitis C Screening: does qualify; Completed 12/30/15  Vision Screening: Recommended annual ophthalmology exams for early detection of glaucoma and other disorders of the eye. Is the patient up to date with their annual eye exam?  Yes  Who is the provider or what is the name of the office in which the patient attends annual eye exams? [facility] If pt is not established with a provider, would they like to be referred to a provider to establish care? No .   Dental Screening: Recommended annual dental exams for proper oral hygiene   Community Resource Referral / Chronic Care Management: CRR required this visit?  No   CCM required this visit?  No     Plan:     I have personally reviewed and noted the following in the patient's chart:   Medical and social history Use of alcohol, tobacco or illicit drugs  Current medications and supplements including opioid prescriptions. Patient is not currently taking opioid prescriptions. Functional ability and status Nutritional status Physical activity Advanced directives List of other physicians Hospitalizations, surgeries, and ER visits  in previous 12 months Vitals Screenings to include cognitive, depression, and falls Referrals and appointments  In addition, I have reviewed and discussed with patient certain preventive protocols, quality metrics, and best practice recommendations. A written personalized care plan for preventive services as well as general preventive health recommendations were provided to patient.     Hal Hope, LPN   16/08/958   After Visit Summary: my chart  Nurse Notes: none

## 2023-05-11 NOTE — Patient Instructions (Addendum)
Jeff Mcmahon , Thank you for taking time to come for your Medicare Wellness Visit. I appreciate your ongoing commitment to your health goals. Please review the following plan we discussed and let me know if I can assist you in the future.   Referrals/Orders/Follow-Ups/Clinician Recommendations: none  This is a list of the screening recommended for you and due dates:  Health Maintenance  Topic Date Due   COVID-19 Vaccine (8 - 2023-24 season) 04/10/2023   Medicare Annual Wellness Visit  05/10/2024   Colon Cancer Screening  06/02/2025   DTaP/Tdap/Td vaccine (2 - Td or Tdap) 07/23/2028   Pneumonia Vaccine  Completed   Flu Shot  Completed   Hepatitis C Screening  Completed   Zoster (Shingles) Vaccine  Completed   HPV Vaccine  Aged Out    Advanced directives: (In Chart) A copy of your advanced directives are scanned into your chart should your provider ever need it.  Next Medicare Annual Wellness Visit scheduled for next year: Yes   05/16/24 @ 9:45 am in person

## 2023-06-21 ENCOUNTER — Encounter: Payer: Self-pay | Admitting: Family Medicine

## 2023-06-21 ENCOUNTER — Ambulatory Visit (INDEPENDENT_AMBULATORY_CARE_PROVIDER_SITE_OTHER): Payer: Medicare Other | Admitting: Family Medicine

## 2023-06-21 VITALS — BP 110/62 | HR 67 | Ht 70.0 in | Wt 186.0 lb

## 2023-06-21 DIAGNOSIS — R030 Elevated blood-pressure reading, without diagnosis of hypertension: Secondary | ICD-10-CM | POA: Diagnosis not present

## 2023-06-21 DIAGNOSIS — E785 Hyperlipidemia, unspecified: Secondary | ICD-10-CM | POA: Diagnosis not present

## 2023-06-21 DIAGNOSIS — R7303 Prediabetes: Secondary | ICD-10-CM | POA: Diagnosis not present

## 2023-06-21 DIAGNOSIS — K2100 Gastro-esophageal reflux disease with esophagitis, without bleeding: Secondary | ICD-10-CM

## 2023-06-21 NOTE — Progress Notes (Signed)
Date:  06/21/2023   Name:  Jeff Mcmahon   DOB:  Jan 07, 1946   MRN:  [id number]   Chief Complaint: Gastroesophageal Reflux  Gastroesophageal Reflux He reports no abdominal pain, no belching, no chest pain, no coughing, no dysphagia, no early satiety, no globus sensation, no heartburn, no hoarse voice, no nausea, no sore throat or no wheezing. This is a chronic problem. The current episode started more than 1 year ago. The problem has been gradually improving. The symptoms are aggravated by certain foods. Pertinent negatives include no anemia, fatigue, melena, muscle weakness, orthopnea or weight loss. He has tried a PPI (otc) for the symptoms. The treatment provided moderate relief.  Hyperlipidemia This is a chronic problem. The current episode started more than 1 year ago. The problem is controlled. Recent lipid tests were reviewed and are normal. He has no history of chronic renal disease, diabetes or hypothyroidism. Pertinent negatives include no chest pain, myalgias or shortness of breath. Current antihyperlipidemic treatment includes diet change. The current treatment provides moderate improvement of lipids. There are no compliance problems.  Risk factors for coronary artery disease include dyslipidemia.  Diabetes Diabetes type: prediabetes. Pertinent negatives for hypoglycemia include no dizziness, headaches or nervousness/anxiousness. Pertinent negatives for diabetes include no chest pain, no fatigue, no polydipsia and no weight loss. Current diabetic treatment includes diet. He is compliant with treatment all of the time. There is no change in his home blood glucose trend.    Lab Results  Component Value Date   NA 142 08/12/2022   K 4.4 08/12/2022   CO2 24 08/12/2022   GLUCOSE 94 08/12/2022   BUN 12 08/12/2022   CREATININE 0.87 08/12/2022   CALCIUM 9.2 08/12/2022   EGFR 90 08/12/2022   GFRNONAA 84 05/16/2019   Lab Results  Component Value Date   CHOL 180 12/22/2022   HDL 44  12/22/2022   LDLCALC 118 (H) 12/22/2022   TRIG 99 12/22/2022   CHOLHDL 3.9 12/31/2019   Lab Results  Component Value Date   TSH 2.430 07/22/2017   Lab Results  Component Value Date   HGBA1C 5.4 08/12/2022   Lab Results  Component Value Date   WBC 5.3 07/22/2017   HGB 16.0 07/22/2017   HCT 47.7 07/22/2017   MCV 96 07/22/2017   PLT 236 07/22/2017   Lab Results  Component Value Date   ALT 22 07/25/2018   AST 20 07/25/2018   ALKPHOS 103 07/25/2018   BILITOT 0.6 07/25/2018   No results found for: "25OHVITD2", "25OHVITD3", "VD25OH"   Review of Systems  Constitutional:  Negative for chills, fatigue, fever and weight loss.  HENT:  Negative for drooling, ear discharge, ear pain, hoarse voice and sore throat.   Respiratory:  Negative for cough, shortness of breath and wheezing.   Cardiovascular:  Negative for chest pain, palpitations and leg swelling.  Gastrointestinal:  Negative for abdominal pain, blood in stool, constipation, diarrhea, dysphagia, heartburn, melena and nausea.  Endocrine: Negative for polydipsia.  Genitourinary:  Negative for dysuria, frequency, hematuria and urgency.  Musculoskeletal:  Negative for back pain, myalgias, muscle weakness and neck pain.  Skin:  Negative for rash.  Allergic/Immunologic: Negative for environmental allergies.  Neurological:  Negative for dizziness and headaches.  Hematological:  Does not bruise/bleed easily.  Psychiatric/Behavioral:  Negative for suicidal ideas. The patient is not nervous/anxious.     Patient Active Problem List   Diagnosis Date Noted   Retinal tear of right eye 12/21/2021   Dysphagia  Cervical spondylosis with radiculopathy 10/02/2015   Prediabetes 09/09/2015   History of colonic polyps 09/05/2015   Hyperlipidemia 09/04/2015   GERD (gastroesophageal reflux disease) 09/04/2015   Dry eyes 09/04/2015    No Known Allergies  Past Surgical History:  Procedure Laterality Date   COLONOSCOPY     COLONOSCOPY  WITH PROPOFOL N/A 06/02/2020   Procedure: COLONOSCOPY WITH PROPOFOL;  Surgeon: Toney Reil, MD;  Location: [facility] ENDOSCOPY;  Service: Gastroenterology;  Laterality: N/A;   ESOPHAGOGASTRODUODENOSCOPY     ESOPHAGOGASTRODUODENOSCOPY (EGD) WITH PROPOFOL N/A 09/02/2020   Procedure: ESOPHAGOGASTRODUODENOSCOPY (EGD) WITH PROPOFOL;  Surgeon: Toney Reil, MD;  Location: [facility] ENDOSCOPY;  Service: Gastroenterology;  Laterality: N/A;   EXCISION MASS NECK N/A 09/07/2016   Procedure: EXCISION MASS NECK;  Surgeon: Lattie Haw, MD;  Location: [facility] ORS;  Service: General;  Laterality: N/A;   EXCISION OF BACK LESION N/A 09/07/2016   Procedure: EXCISION OF BACK LESION;  Surgeon: Lattie Haw, MD;  Location: [facility] ORS;  Service: General;  Laterality: N/A;   MOUTH SURGERY     dental procedure   RETINAL LASER PROCEDURE Right 04/06/2021   [facility] in [place]    Social History   Tobacco Use   Smoking status: Never   Smokeless tobacco: Never   Tobacco comments:    smoking cessation materials not required  Vaping Use   Vaping status: Never Used  Substance Use Topics   Alcohol use: Yes    Comment: social   Drug use: No     Medication list has been reviewed and updated.  Current Meds  Medication Sig   acetaminophen (TYLENOL) 500 MG tablet Take 500 mg by mouth every 6 (six) hours as needed.   diclofenac Sodium (VOLTAREN) 1 % GEL Apply topically 4 (four) times daily.   famotidine (PEPCID) 40 MG tablet Take 1 tablet (40 mg total) by mouth daily.   Multiple Vitamin (MULTIVITAMIN WITH MINERALS) TABS tablet Take 1 tablet by mouth daily.   Polyethyl Glycol-Propyl Glycol (SYSTANE) 0.4-0.3 % SOLN Apply to eye.   psyllium (METAMUCIL) 58.6 % powder Take 1 packet by mouth 3 (three) times daily.   sodium chloride (OCEAN) 0.65 % SOLN nasal spray Place 1 spray into both nostrils as needed for congestion.    triamcinolone (NASACORT) 55 MCG/ACT AERO nasal inhaler Place 2  sprays into the nose daily. Dr. Elenore Rota       06/21/2023    7:58 AM 12/22/2022    7:50 AM 08/11/2022    9:36 AM 06/23/2022    8:07 AM  GAD 7 : Generalized Anxiety Score  Nervous, Anxious, on Edge 0 0 0 0  Control/stop worrying 0 0 0 0  Worry too much - different things 0 0 0 0  Trouble relaxing 0 0 0 0  Restless 0 0 0 0  Easily annoyed or irritable 0 0 0 0  Afraid - awful might happen 0 0 0 0  Total GAD 7 Score 0 0 0 0  Anxiety Difficulty Not difficult at all Not difficult at all Not difficult at all Not difficult at all       06/21/2023    7:58 AM 05/11/2023    9:43 AM 12/22/2022    7:50 AM  Depression screen PHQ 2/9  Decreased Interest 0 0 0  Down, Depressed, Hopeless 0 0 0  PHQ - 2 Score 0 0 0  Altered sleeping 0 0 0  Tired, decreased energy 0 0 0  Change in appetite 0  0 0  Feeling bad or failure about yourself  0 0 0  Trouble concentrating 0 0 0  Moving slowly or fidgety/restless 0 0 0  Suicidal thoughts 0 0 0  PHQ-9 Score 0 0 0  Difficult doing work/chores Not difficult at all Not difficult at all Not difficult at all    BP Readings from Last 3 Encounters:  06/21/23 110/62  05/11/23 120/60  12/22/22 116/64    Physical Exam Vitals and nursing note reviewed.  HENT:     Head: Normocephalic.     Right Ear: Tympanic membrane and external ear normal.     Left Ear: Tympanic membrane and external ear normal.     Nose: Nose normal.     Mouth/Throat:     Mouth: Mucous membranes are moist.  Eyes:     General: No scleral icterus.       Right eye: No discharge.        Left eye: No discharge.     Conjunctiva/sclera: Conjunctivae normal.     Pupils: Pupils are equal, round, and reactive to light.  Neck:     Thyroid: No thyromegaly.     Vascular: No JVD.     Trachea: No tracheal deviation.  Cardiovascular:     Rate and Rhythm: Normal rate and regular rhythm.     Heart sounds: Normal heart sounds. No murmur heard.    No friction rub. No gallop.  Pulmonary:      Effort: No respiratory distress.     Breath sounds: Normal breath sounds. No wheezing, rhonchi or rales.  Chest:     Chest wall: No tenderness.  Abdominal:     General: Bowel sounds are normal.     Palpations: Abdomen is soft. There is no mass.     Tenderness: There is no abdominal tenderness. There is no guarding or rebound.  Musculoskeletal:        General: No tenderness. Normal range of motion.     Cervical back: Normal range of motion and neck supple.  Lymphadenopathy:     Cervical: No cervical adenopathy.  Skin:    General: Skin is warm.     Findings: No rash.  Neurological:     Mental Status: He is alert and oriented to person, place, and time.     Cranial Nerves: No cranial nerve deficit.     Deep Tendon Reflexes: Reflexes are normal and symmetric.     Wt Readings from Last 3 Encounters:  06/21/23 186 lb (84.4 kg)  05/11/23 189 lb 6.4 oz (85.9 kg)  12/22/22 186 lb (84.4 kg)    BP 110/62   Pulse 67   Ht 5\' 10"  (1.778 m)   Wt 186 lb (84.4 kg)   SpO2 98%   BMI 26.69 kg/m   Assessment and Plan:  1. Gastroesophageal reflux disease with esophagitis without hemorrhage Chronic.  Controlled.  Stable.  Currently patient is on over-the-counter Pepcid 40 mg once a day.  This is being tolerated well and will continue with current over-the-counter acquisition.  2. Prediabetes Chronic.  Controlled.  Stable.  Asymptomatic without progression to polyuria polydipsia.  Will check A1c for current level of control. - Hemoglobin A1c  3. Hyperlipidemia, unspecified hyperlipidemia type Chronic.  Diet controlled.  Stable.  Patient is tolerating dietary control and we will check lipid panel for current level of LDL control.  We will also check triglycerides as well patient has been given low-cholesterol low triglyceride guidelines for future reference. - Lipid Panel With  LDL/HDL Ratio  4. Elevated blood pressure reading without diagnosis of hypertension Chronic.  Controlled.  Stable.   Blood pressure is excellent 110/62.  Patient is currently controlling with limitations of sodium intake and we will continue in this direction and will recheck in 6 months.   Elizabeth Sauer, MD

## 2023-06-21 NOTE — Patient Instructions (Signed)

## 2023-06-22 ENCOUNTER — Encounter: Payer: Self-pay | Admitting: Family Medicine

## 2023-06-22 LAB — HEMOGLOBIN A1C
Est. average glucose Bld gHb Est-mCnc: 111 mg/dL
Hgb A1c MFr Bld: 5.5 % (ref 4.8–5.6)

## 2023-06-22 LAB — LIPID PANEL WITH LDL/HDL RATIO
Cholesterol, Total: 199 mg/dL (ref 100–199)
HDL: 47 mg/dL (ref >39)
LDL Chol Calc (NIH): 136 mg/dL — ABNORMAL HIGH (ref 0–99)
LDL/HDL Ratio: 2.9 ratio (ref 0.0–3.6)
Triglycerides: 90 mg/dL (ref 0–149)
VLDL Cholesterol Cal: 16 mg/dL (ref 5–40)

## 2023-06-22 MED ORDER — ATORVASTATIN CALCIUM 10 MG PO TABS: 10.0000 mg | ORAL_TABLET | Freq: Every day | ORAL | 1 refills | Status: DC

## 2023-06-22 NOTE — Addendum Note (Signed)
Addended by: Duanne Limerick on: 06/22/2023 06:33 AM   Modules accepted: Orders

## 2023-06-24 ENCOUNTER — Ambulatory Visit: Payer: Medicare Other | Admitting: Family Medicine

## 2023-07-25 DIAGNOSIS — G3184 Mild cognitive impairment, so stated: Secondary | ICD-10-CM | POA: Diagnosis not present

## 2023-09-29 ENCOUNTER — Ambulatory Visit (INDEPENDENT_AMBULATORY_CARE_PROVIDER_SITE_OTHER): Payer: Medicare Other | Admitting: Family Medicine

## 2023-09-29 VITALS — BP 140/64 | HR 89 | Ht 70.0 in | Wt 190.8 lb

## 2023-09-29 DIAGNOSIS — M4722 Other spondylosis with radiculopathy, cervical region: Secondary | ICD-10-CM

## 2023-09-29 MED ORDER — PREDNISONE 20 MG PO TABS: 20.0000 mg | ORAL_TABLET | Freq: Two times a day (BID) | ORAL | 0 refills | Status: DC

## 2023-09-29 MED ORDER — BACLOFEN 5 MG PO TABS: 1.0000 | ORAL_TABLET | Freq: Three times a day (TID) | ORAL | 0 refills | Status: DC | PRN

## 2023-09-29 MED ORDER — GABAPENTIN 100 MG PO CAPS: 100.0000 mg | ORAL_CAPSULE | Freq: Every evening | ORAL | 0 refills | Status: DC

## 2023-09-30 NOTE — Progress Notes (Signed)
Primary Care / Sports Medicine Office Visit  Patient Information:  Patient ID: Jeff Mcmahon, male DOB: Jul 17, 1946 Age: 78 y.o. MRN: [id number]   Jeff Mcmahon is a pleasant 78 y.o. male presenting with the following:  Chief Complaint  Patient presents with   Neck Pain    Neck pain x 3 weeks over the last couple days has got worse. Having weakness, numbness, and tingling in Bil arms. Patient is taking Tylenol for pain and also using voltaren. ROM is restricted and painful when patient is moving neck in all directions but worse on the left side. No recent xrays. Last xrays taken in 2023.    Vitals:   09/29/23 1308  BP: (!) 140/64  Pulse: 89  SpO2: 96%   Vitals:   09/29/23 1308  Weight: 190 lb 12.8 oz (86.5 kg)  Height: 5\' 10"  (1.778 m)   Body mass index is 27.38 kg/m.  No results found.   Independent interpretation of notes and tests performed by another provider:   None  Procedures performed:   None  Pertinent History, Exam, Impression, and Recommendations:   Problem List Items Addressed This Visit     Cervical spondylosis with radiculopathy - Primary   History of Present Illness Jeff Mcmahon is a 78 year old male with cervical spine degenerative changes who presents with worsening neck pain and stiffness.  He has been experiencing worsening neck pain and stiffness for the past three weeks, with increased stiffness and tightness, particularly on the back right side, and some pain on the left. The pain has intensified over the last two days, especially when lying down and rotating his head, reaching a severity of ten out of ten.  He reports a sensation of numbness in both of his arms, described as 'a little bit', with symptoms equally present in both arms and no specific pattern of occurrence.  He has been using Voltaren topically and has taken acetaminophen for pain relief. He attempted some neck exercises found online but not extensively.   Physical  Exam MUSCULOSKELETAL: Positive Spurling test on the right, negative on the left. Nontender spinous processes, paraspinal spasm. NEUROLOGICAL: Motor function, sensation, proprioception, reflexes, and cranial nerves intact. Neurological exam normal otherwise.  Assessment and Plan Cervical Spondylosis with radiculopathy Increased neck stiffness and pain, particularly on the right side, with intermittent numbness radiating down both arms. Pain is severe (10/10) when lying down and turning head. X-rays from November 2023 with degenerative changes and foraminal narrowing at C4-C5 and C5-C6 levels. Positive Spurling test on the right side. -Start Prednisone 20 mg twice daily for 5 days to reduce inflammation. -Continue Voltaren as needed. -Resume Gabapentin 100 mg in the evening for 5 days, then switch to as-needed dosing. -Add Baclofen 5 mg as needed for muscle relaxation. -Start neck exercises once symptoms are 80-90% improved. -Contact office via MyChart or phone call in 2 weeks if not improving rapidly or if symptoms return after stopping Prednisone.      Relevant Medications   gabapentin (NEURONTIN) 100 MG capsule   predniSONE (DELTASONE) 20 MG tablet   Baclofen 5 MG TABS     Orders & Medications Medications:  Meds ordered this encounter  Medications   gabapentin (NEURONTIN) 100 MG capsule    Sig: Take 1 capsule (100 mg total) by mouth every evening.    Dispense:  30 capsule    Refill:  0   predniSONE (DELTASONE) 20 MG tablet    Sig: Take 1 tablet (20 mg  total) by mouth 2 (two) times daily with a meal for 10 days.    Dispense:  10 tablet    Refill:  0   Baclofen 5 MG TABS    Sig: Take 1 tablet (5 mg total) by mouth 3 (three) times daily as needed (muscle tightness pain). SIDE EFFECT CAN BE DROWSINESS    Dispense:  60 tablet    Refill:  0   No orders of the defined types were placed in this encounter.    No follow-ups on file.     Jerrol Banana, MD, [facility]   Primary Care  Sports Medicine Primary Care and Sports Medicine at [facility]

## 2023-09-30 NOTE — Patient Instructions (Addendum)
-   Start Prednisone 20 mg twice daily for 5 days. - Continue Voltaren as needed for pain. - Resume Gabapentin 100 mg in the evening for 5 days, then switch to as-needed dosing. - Add Baclofen 5 mg as needed for muscle relaxation. - Start neck exercises once symptoms are 80-90% improved. - Contact office via MyChart or phone call in 2 weeks if not improving rapidly or if symptoms return after stopping Prednisone.

## 2023-09-30 NOTE — Assessment & Plan Note (Addendum)
History of Present Illness Jeff Mcmahon is a 78 year old male with cervical spine degenerative changes who presents with worsening neck pain and stiffness.  He has been experiencing worsening neck pain and stiffness for the past three weeks, with increased stiffness and tightness, particularly on the back right side, and some pain on the left. The pain has intensified over the last two days, especially when lying down and rotating his head, reaching a severity of ten out of ten.  He reports a sensation of numbness in both of his arms, described as 'a little bit', with symptoms equally present in both arms and no specific pattern of occurrence.  He has been using Voltaren topically and has taken acetaminophen for pain relief. He attempted some neck exercises found online but not extensively.   Physical Exam MUSCULOSKELETAL: Positive Spurling test on the right, negative on the left. Nontender spinous processes, paraspinal spasm. NEUROLOGICAL: Motor function, sensation, proprioception, reflexes, and cranial nerves intact. Neurological exam normal otherwise.  Assessment and Plan Cervical Spondylosis with radiculopathy Increased neck stiffness and pain, particularly on the right side, with intermittent numbness radiating down both arms. Pain is severe (10/10) when lying down and turning head. X-rays from November 2023 with degenerative changes and foraminal narrowing at C4-C5 and C5-C6 levels. Positive Spurling test on the right side. -Start Prednisone 20 mg twice daily for 5 days to reduce inflammation. -Continue Voltaren as needed. -Resume Gabapentin 100 mg in the evening for 5 days, then switch to as-needed dosing. -Add Baclofen 5 mg as needed for muscle relaxation. -Start neck exercises once symptoms are 80-90% improved. -Contact office via MyChart or phone call in 2 weeks if not improving rapidly or if symptoms return after stopping Prednisone.

## 2023-10-07 ENCOUNTER — Encounter: Payer: Self-pay | Admitting: Family Medicine

## 2023-10-07 ENCOUNTER — Other Ambulatory Visit: Payer: Self-pay | Admitting: Family Medicine

## 2023-10-07 DIAGNOSIS — M4722 Other spondylosis with radiculopathy, cervical region: Secondary | ICD-10-CM

## 2023-10-07 MED ORDER — PREDNISONE 20 MG PO TABS
20.0000 mg | ORAL_TABLET | Freq: Two times a day (BID) | ORAL | 0 refills | Status: AC
Start: 2023-10-07 — End: 2023-10-14

## 2023-10-07 NOTE — Telephone Encounter (Signed)
 Please review.  KP

## 2023-11-02 DIAGNOSIS — Z23 Encounter for immunization: Secondary | ICD-10-CM | POA: Diagnosis not present

## 2023-11-08 ENCOUNTER — Encounter: Payer: Self-pay | Admitting: Student in an Organized Health Care Education/Training Program

## 2023-11-08 ENCOUNTER — Ambulatory Visit
Attending: Student in an Organized Health Care Education/Training Program | Admitting: Student in an Organized Health Care Education/Training Program

## 2023-11-08 VITALS — BP 147/80 | HR 66 | Temp 97.2°F | Ht 70.0 in | Wt 181.0 lb

## 2023-11-08 DIAGNOSIS — M5412 Radiculopathy, cervical region: Secondary | ICD-10-CM | POA: Insufficient documentation

## 2023-11-08 DIAGNOSIS — G894 Chronic pain syndrome: Secondary | ICD-10-CM | POA: Diagnosis not present

## 2023-11-08 DIAGNOSIS — M542 Cervicalgia: Secondary | ICD-10-CM | POA: Insufficient documentation

## 2023-11-08 DIAGNOSIS — M47812 Spondylosis without myelopathy or radiculopathy, cervical region: Secondary | ICD-10-CM | POA: Insufficient documentation

## 2023-11-08 NOTE — Progress Notes (Signed)
 Patient: Jeff Mcmahon  Service Category: E/M  Provider: Edward Jolly, MD  DOB: 07/21/1946  DOS: 11/08/2023  Referring Provider: Jerrol Banana, MD  MRN: [id number]  Setting: Ambulatory outpatient  PCP: Duanne Limerick, MD  Type: New Patient  Specialty: Interventional Pain Management    Location: Office  Delivery: Face-to-face     Primary Reason(s) for Visit: Encounter for initial evaluation of one or more chronic problems (new to examiner) potentially causing chronic pain, and posing a threat to normal musculoskeletal function. (Level of risk: High) CC: Neck Pain (Mainly on right side)  HPI  Jeff Mcmahon is a 78 y.o. year old, male patient, who comes for the first time to our practice referred by Ashley Royalty Ocie Bob, MD for our initial evaluation of his chronic pain. He has Hyperlipidemia; GERD (gastroesophageal reflux disease); Dry eyes; History of colonic polyps; Prediabetes; Cervical spondylosis; Dysphagia; Retinal tear of right eye; Cervical radicular pain; and Chronic pain syndrome on their problem list. Today he comes in for evaluation of his Neck Pain (Mainly on right side)  Pain Assessment: Location: Right Neck Radiating: radiates down right arm occasionally Onset: More than a month ago Duration: Chronic pain Quality: Numbness, Dull, Aching Severity: 3 /10 (subjective, self-reported pain score)  Effect on ADL: limits ADls Timing: Constant Modifying factors: sitting still, hot shower BP: (!) 147/80  HR: 66  Onset and Duration: Gradual Cause of pain: Unknown Severity: No change since onset, NAS-11 at its worse: 10/10, NAS-11 at its best: 1/10, NAS-11 now: 5/10, and NAS-11 on the average: 3/10 Timing: Night, During activity or exercise, and After a period of immobility Aggravating Factors: Eating and Twisting Alleviating Factors: Hot packs, Medications, and Warm showers or baths Associated Problems: Erectile dysfunction, Numbness, Spasms, and Tingling Quality of Pain:  Sharp Previous Examinations or Tests: X-rays and Orthopedic evaluation Previous Treatments: Steroid treatments by mouth and Traction   Mr. Wadhwa is being evaluated for possible interventional pain management therapies for the treatment of his chronic pain.   Discussed the use of AI scribe software for clinical note transcription with the patient, who gave verbal consent to proceed.  History of Present Illness   Jeff Mcmahon is a 78 year old male who presents with chronic right-sided neck pain. He was referred by Dr. Joseph Berkshire for evaluation of his chronic neck pain.  He has chronic right-sided neck pain, sometimes radiating into his arms and up to the back of his head. The pain is primarily on the right side, with occasional involvement of the left side. He experiences more numbness than pain in his arms, predominantly on the right side. No history of trauma or injury is reported.  The neck pain began approximately 40 years ago while pitching baseball, leading to a neurologist consultation and traction therapy, which was effective at the time. In November 2023, he experienced extreme stiffness in his neck, prompting a visit to Dr. Ashley Royalty. Initially, there was minimal pain, but by February, he began experiencing some pain. He has tried prednisone, which provided significant short-term relief, but was limited to two rounds. He also attempted traction years ago with good results. Gabapentin was tried but caused significant drowsiness, and baclofen was not helpful. He has not engaged in much physical therapy due to the recurrence of pain, although he has been prescribed neck exercises to perform at home.  An x-ray conducted in November 2023 revealed degeneration and narrowing at the C4-C5 and C5-C6 levels. He has not tried non-steroidal anti-inflammatory drugs (NSAIDs) like  ibuprofen or Aleve due to medical advice, but he takes acetaminophen for pain management.  No weakness in his upper  extremities, such as difficulty holding utensils or issues with fine motor control. He is not on any blood thinners.     Meds   Current Outpatient Medications:    acetaminophen (TYLENOL) 500 MG tablet, Take 500 mg by mouth every 6 (six) hours as needed., Disp: , Rfl:    atorvastatin (LIPITOR) 10 MG tablet, Take 1 tablet (10 mg total) by mouth daily., Disp: 90 tablet, Rfl: 1   famotidine (PEPCID) 40 MG tablet, Take 1 tablet (40 mg total) by mouth daily., Disp: 90 tablet, Rfl: 1   Multiple Vitamin (MULTIVITAMIN WITH MINERALS) TABS tablet, Take 1 tablet by mouth daily., Disp: , Rfl:    psyllium (METAMUCIL) 58.6 % powder, Take 1 packet by mouth 3 (three) times daily., Disp: , Rfl:    sodium chloride (OCEAN) 0.65 % SOLN nasal spray, Place 1 spray into both nostrils as needed for congestion. , Disp: , Rfl:    triamcinolone (NASACORT) 55 MCG/ACT AERO nasal inhaler, Place 2 sprays into the nose daily. Dr. Elenore Rota, Disp: , Rfl:   Imaging Review    Narrative CLINICAL DATA:  Trapezius pain. Pain and stiffness in the LEFT side of the neck. Symptoms for 9 months. No known injury.  EXAM: CERVICAL SPINE - COMPLETE 4+ VIEW  COMPARISON:  None Available.  FINDINGS: There is loss of cervical lordosis. This may be secondary to splinting, soft tissue injury, or positioning. There is moderate disc height loss with uncovertebral spurring C4-5, C5-6 C6-7. No acute fracture or subluxation. No lytic or blastic lesions. Lung apices are clear.  IMPRESSION: 1. Moderate degenerative changes. No evidence for acute abnormality. 2. Loss of cervical lordosis.   Electronically Signed By: Norva Pavlov M.D. On: 06/24/2022 14:24   Narrative CLINICAL DATA:  Post slip and fall, injuring elbow.  EXAM: LEFT ELBOW - COMPLETE 3+ VIEW  COMPARISON:  None.  FINDINGS: Mild soft tissue swelling about the posterior aspect of the elbow. No associated fracture or elbow joint effusion. Joint spaces  appear preserved. No radiopaque foreign body.  IMPRESSION: Mild soft tissue swelling about the posterior aspect of the elbow without associated fracture or elbow joint effusion.   Electronically Signed By: Simonne Come M.D. On: 01/09/2019 10:21  Complexity Note: Imaging results reviewed.                         ROS  Cardiovascular: Abnormal heart rhythm and High blood pressure Pulmonary or Respiratory: Snoring  Neurological: No reported neurological signs or symptoms such as seizures, abnormal skin sensations, urinary and/or fecal incontinence, being born with an abnormal open spine and/or a tethered spinal cord Psychological-Psychiatric: No reported psychological or psychiatric signs or symptoms such as difficulty sleeping, anxiety, depression, delusions or hallucinations (schizophrenial), mood swings (bipolar disorders) or suicidal ideations or attempts Gastrointestinal: Vomiting blood (Ulcers) Genitourinary: No reported renal or genitourinary signs or symptoms such as difficulty voiding or producing urine, peeing blood, non-functioning kidney, kidney stones, difficulty emptying the bladder, difficulty controlling the flow of urine, or chronic kidney disease Hematological: No reported hematological signs or symptoms such as prolonged bleeding, low or poor functioning platelets, bruising or bleeding easily, hereditary bleeding problems, low energy levels due to low hemoglobin or being anemic Endocrine: No reported endocrine signs or symptoms such as high or low blood sugar, rapid heart rate due to high thyroid levels, obesity or  weight gain due to slow thyroid or thyroid disease Rheumatologic: No reported rheumatological signs and symptoms such as fatigue, joint pain, tenderness, swelling, redness, heat, stiffness, decreased range of motion, with or without associated rash Musculoskeletal: Negative for myasthenia gravis, muscular dystrophy, multiple sclerosis or malignant hyperthermia Work  History: Retired  Allergies  Mr. Wenrich is allergic to voltaren [diclofenac].  Laboratory Chemistry Profile   Renal Lab Results  Component Value Date   BUN 12 08/12/2022   CREATININE 0.87 08/12/2022   BCR 14 08/12/2022   GFRAA 98 05/16/2019   GFRNONAA 84 05/16/2019     Electrolytes Lab Results  Component Value Date   NA 142 08/12/2022   K 4.4 08/12/2022   CL 101 08/12/2022   CALCIUM 9.2 08/12/2022   PHOS 3.5 08/12/2022     Hepatic Lab Results  Component Value Date   AST 20 07/25/2018   ALT 22 07/25/2018   ALBUMIN 4.5 08/12/2022   ALKPHOS 103 07/25/2018     ID Lab Results  Component Value Date   SARSCOV2NAA NEGATIVE 08/29/2020     Bone No results found for: "VD25OH", "VD125OH2TOT", "ZO1096EA5", "WU9811BJ4", "25OHVITD1", "25OHVITD2", "25OHVITD3", "TESTOFREE", "TESTOSTERONE"   Endocrine Lab Results  Component Value Date   GLUCOSE 94 08/12/2022   HGBA1C 5.5 06/21/2023   TSH 2.430 07/22/2017     Neuropathy Lab Results  Component Value Date   HGBA1C 5.5 06/21/2023     CNS No results found for: "COLORCSF", "APPEARCSF", "RBCCOUNTCSF", "WBCCSF", "POLYSCSF", "LYMPHSCSF", "EOSCSF", "PROTEINCSF", "GLUCCSF", "JCVIRUS", "CSFOLI", "IGGCSF", "LABACHR", "ACETBL"   Inflammation (CRP: Acute  ESR: Chronic) No results found for: "CRP", "ESRSEDRATE", "LATICACIDVEN"   Rheumatology No results found for: "RF", "ANA", "LABURIC", "URICUR", "LYMEIGGIGMAB", "LYMEABIGMQN", "HLAB27"   Coagulation Lab Results  Component Value Date   PLT 236 07/22/2017     Cardiovascular Lab Results  Component Value Date   HGB 16.0 07/22/2017   HCT 47.7 07/22/2017     Screening Lab Results  Component Value Date   SARSCOV2NAA NEGATIVE 08/29/2020     Cancer No results found for: "CEA", "CA125", "LABCA2"   Allergens No results found for: "ALMOND", "APPLE", "ASPARAGUS", "AVOCADO", "BANANA", "BARLEY", "BASIL", "BAYLEAF", "GREENBEAN", "LIMABEAN", "WHITEBEAN", "BEEFIGE", "REDBEET",  "BLUEBERRY", "BROCCOLI", "CABBAGE", "MELON", "CARROT", "CASEIN", "CASHEWNUT", "CAULIFLOWER", "CELERY"     Note: Lab results reviewed.  PFSH  Drug: Mr. Demeyer  reports no history of drug use. Alcohol:  reports current alcohol use. Tobacco:  reports that he has never smoked. He has never used smokeless tobacco. Medical:  has a past medical history of Arthritis, Dysrhythmia (2007), GERD (gastroesophageal reflux disease), Hyperlipidemia, and Pre-diabetes. Family: family history includes Alcohol abuse in his father; Cancer in his father; Heart disease in his brother; Hypertension in his mother; Lymphoma (age of onset: 75) in his mother; Mental illness in his father.  Past Surgical History:  Procedure Laterality Date   COLONOSCOPY     COLONOSCOPY WITH PROPOFOL N/A 06/02/2020   Procedure: COLONOSCOPY WITH PROPOFOL;  Surgeon: Toney Reil, MD;  Location: [facility] ENDOSCOPY;  Service: Gastroenterology;  Laterality: N/A;   ESOPHAGOGASTRODUODENOSCOPY     ESOPHAGOGASTRODUODENOSCOPY (EGD) WITH PROPOFOL N/A 09/02/2020   Procedure: ESOPHAGOGASTRODUODENOSCOPY (EGD) WITH PROPOFOL;  Surgeon: Toney Reil, MD;  Location: [facility] ENDOSCOPY;  Service: Gastroenterology;  Laterality: N/A;   EXCISION MASS NECK N/A 09/07/2016   Procedure: EXCISION MASS NECK;  Surgeon: Lattie Haw, MD;  Location: [facility] ORS;  Service: General;  Laterality: N/A;   EXCISION OF BACK LESION N/A 09/07/2016   Procedure: EXCISION OF BACK  LESION;  Surgeon: Lattie Haw, MD;  Location: [facility] ORS;  Service: General;  Laterality: N/A;   MOUTH SURGERY     dental procedure   RETINAL LASER PROCEDURE Right 04/06/2021   [facility]ist in [place]   Active Ambulatory Problems    Diagnosis Date Noted   Hyperlipidemia 09/04/2015   GERD (gastroesophageal reflux disease) 09/04/2015   Dry eyes 09/04/2015   History of colonic polyps 09/05/2015   Prediabetes 09/09/2015   Cervical spondylosis 10/02/2015   Dysphagia     Retinal tear of right eye 12/21/2021   Cervical radicular pain 11/08/2023   Chronic pain syndrome 11/08/2023   Resolved Ambulatory Problems    Diagnosis Date Noted   Hypertension 09/04/2015   Neck mass    Mass of skin of back    Past Medical History:  Diagnosis Date   Arthritis    Dysrhythmia 2007   Pre-diabetes    Constitutional Exam  General appearance: Well nourished, well developed, and well hydrated. In no apparent acute distress Vitals:   11/08/23 1035  BP: (!) 147/80  Pulse: 66  Temp: (!) 97.2 F (36.2 C)  SpO2: 100%  Weight: 181 lb (82.1 kg)  Height: 5\' 10"  (1.778 m)   BMI Assessment: Estimated body mass index is 25.97 kg/m as calculated from the following:   Height as of this encounter: 5\' 10"  (1.778 m).   Weight as of this encounter: 181 lb (82.1 kg).  BMI interpretation table: BMI level Category Range association with higher incidence of chronic pain  <18 kg/m2 Underweight   18.5-24.9 kg/m2 Ideal body weight   25-29.9 kg/m2 Overweight Increased incidence by 20%  30-34.9 kg/m2 Obese (Class I) Increased incidence by 68%  35-39.9 kg/m2 Severe obesity (Class II) Increased incidence by 136%  >40 kg/m2 Extreme obesity (Class III) Increased incidence by 254%   Patient's current BMI Ideal Body weight  Body mass index is 25.97 kg/m. Ideal body weight: 73 kg (160 lb 15 oz) Adjusted ideal body weight: 76.6 kg (168 lb 15.4 oz)   BMI Readings from Last 4 Encounters:  11/08/23 25.97 kg/m  09/29/23 27.38 kg/m  06/21/23 26.69 kg/m  05/11/23 27.18 kg/m   Wt Readings from Last 4 Encounters:  11/08/23 181 lb (82.1 kg)  09/29/23 190 lb 12.8 oz (86.5 kg)  06/21/23 186 lb (84.4 kg)  05/11/23 189 lb 6.4 oz (85.9 kg)    Psych/Mental status: Alert, oriented x 3 (person, place, & time)       Eyes: PERLA Respiratory: No evidence of acute respiratory distress  Cervical Spine Area Exam  Skin & Axial Inspection: No masses, redness, edema, swelling, or associated  skin lesions Alignment: Symmetrical Functional ROM: Pain restricted ROM, to the right>left Stability: No instability detected Muscle Tone/Strength: Functionally intact. No obvious neuro-muscular anomalies detected. Sensory (Neurological): Dermatomal pain pattern Palpation: No palpable anomalies             Upper Extremity (UE) Exam    Side: Right upper extremity  Side: Left upper extremity  Skin & Extremity Inspection: Skin color, temperature, and hair growth are WNL. No peripheral edema or cyanosis. No masses, redness, swelling, asymmetry, or associated skin lesions. No contractures.  Skin & Extremity Inspection: Skin color, temperature, and hair growth are WNL. No peripheral edema or cyanosis. No masses, redness, swelling, asymmetry, or associated skin lesions. No contractures.  Functional ROM: Unrestricted ROM          Functional ROM: Unrestricted ROM  Muscle Tone/Strength: Functionally intact. No obvious neuro-muscular anomalies detected.  Muscle Tone/Strength: Functionally intact. No obvious neuro-muscular anomalies detected.  Sensory (Neurological): Dermatomal pain pattern          Sensory (Neurological): Dermatomal pain pattern          Palpation: No palpable anomalies              Palpation: No palpable anomalies              Provocative Test(s):  Phalen's test: deferred Tinel's test: deferred Apley's scratch test (touch opposite shoulder):  Action 1 (Across chest): deferred Action 2 (Overhead): deferred Action 3 (LB reach): deferred   Provocative Test(s):  Phalen's test: deferred Tinel's test: deferred Apley's scratch test (touch opposite shoulder):  Action 1 (Across chest): deferred Action 2 (Overhead): deferred Action 3 (LB reach): deferred     Assessment  Primary Diagnosis & Pertinent Problem List: The primary encounter diagnosis was Cervical radicular pain. Diagnoses of Cervical spondylosis, Chronic pain syndrome, and Cervicalgia were also pertinent to this  visit.  Visit Diagnosis (New problems to examiner): 1. Cervical radicular pain   2. Cervical spondylosis   3. Chronic pain syndrome   4. Cervicalgia    Plan of Care (Initial workup plan)  Assessment and Plan    Cervical radiculopathy   Chronic right-sided neck pain radiates to the arms and occipital region, indicating cervical radiculopathy. Symptoms include arm numbness and restricted movement. A November 2023 X-ray showed degeneration and foraaminal narrowing at C4-C5 and C5-C6. Occipital neuralgia due to nerve inflammation is a differential diagnosis. Previous treatments with prednisone, traction, gabapentin, and baclofen had varying efficacy.  He has also completed a home exercise rehab program provided by Dr. Ashley Royalty with limited benefit.  I recommend further diagnostic workup via cervical MRI. Physical therapy is considered beneficial. Refer to physical therapy with Consuela Mimes in [place]. Schedule an epidural injection in the neck in 4-6 weeks, contingent on MRI results however her cervical spine x-ray reveals cervical foraminal narrowing at C4-C5 and C5-C6 and symptoms are consistent with cervical radicular pain. Discuss potential use of Lyrica or Cymbalta for neuropathic pain if needed, he wants to hold off for now. Avoid NSAIDs per primary care physician's advice.  Occipital neuralgia   Pain in the occipital region may relate to cervical radiculopathy, possibly due to occipital nerve inflammation. MRI results will help guide further treatment  1. Cervical radicular pain (Primary) - Ambulatory referral to Physical Therapy - MR CERVICAL SPINE WO CONTRAST; Future - Cervical Epidural Injection; Future  2. Cervical spondylosis - Ambulatory referral to Physical Therapy - MR CERVICAL SPINE WO CONTRAST; Future  3. Chronic pain syndrome - Ambulatory referral to Physical Therapy - MR CERVICAL SPINE WO CONTRAST; Future - Cervical Epidural Injection; Future  4. Cervicalgia -  Ambulatory referral to Physical Therapy - MR CERVICAL SPINE WO CONTRAST; Future - Cervical Epidural Injection; Future     Imaging Orders         MR CERVICAL SPINE WO CONTRAST    Referral Orders         Ambulatory referral to Physical Therapy    Procedure Orders         Cervical Epidural Injection     Provider-requested follow-up: Return in about 29 days (around 12/07/2023) for C-ESI, in clinic [facility].  Future Appointments  Date Time Provider Department Center  12/07/2023 10:40 AM Edward Jolly, MD [facility] None  12/13/2023  8:00 AM Duanne Limerick, MD [facility]  05/16/2024  9:30  AM [facility]-ANNUAL WELLNESS VISIT [facility]   I discussed the assessment and treatment plan with the patient. The patient was provided an opportunity to ask questions and all were answered. The patient agreed with the plan and demonstrated an understanding of the instructions.  Patient advised to call back or seek an in-person evaluation if the symptoms or condition worsens.  Duration of encounter: .  Total time on encounter, as per AMA guidelines included both the face-to-face and non-face-to-face time personally spent by the physician and/or other qualified health care professional(s) on the day of the encounter (includes time in activities that require the physician or other qualified health care professional and does not include time in activities normally performed by clinical staff). Physician's time may include the following activities when performed: Preparing to see the patient (e.g., pre-charting review of records, searching for previously ordered imaging, lab work, and nerve conduction tests) Review of prior analgesic pharmacotherapies. Reviewing PMP Interpreting ordered tests (e.g., lab work, imaging, nerve conduction tests) Performing post-procedure evaluations, including interpretation of diagnostic procedures Obtaining and/or reviewing separately obtained history Performing a medically  appropriate examination and/or evaluation Counseling and educating the patient/family/caregiver Ordering medications, tests, or procedures Referring and communicating with other health care professionals (when not separately reported) Documenting clinical information in the electronic or other health record Independently interpreting results (not separately reported) and communicating results to the patient/ family/caregiver Care coordination (not separately reported)  Note by: Edward Jolly, MD Date: 11/08/2023; Time: 11:42 AM

## 2023-11-08 NOTE — Progress Notes (Signed)
 Safety precautions to be maintained throughout the outpatient stay will include: orient to surroundings, keep bed in low position, maintain call bell within reach at all times, provide assistance with transfer out of bed and ambulation.

## 2023-11-08 NOTE — Patient Instructions (Signed)
 Procedure instructions  Do not eat or drink fluids (other than water) for 6 hours before your procedure  No water for 2 hours before your procedure  Take your blood pressure medicine with a sip of water  Arrive 30 minutes before your appointment  Carefully read the "Preparing for your procedure" detailed instructions  If you have questions call us at [phone]  _____________________________________________________________________    ______________________________________________________________________  Preparing for your procedure  Appointments: If you think you may not be able to keep your appointment, call 24-48 hours in advance to cancel. We need time to make it available to others.  During your procedure appointment there will be: No Prescription Refills. No disability issues to discussed. No medication changes or discussions.  Instructions: Food intake: Avoid eating anything solid for at least 8 hours prior to your procedure. Clear liquid intake: You may take clear liquids such as water up to 2 hours prior to your procedure. (No carbonated drinks. No soda.) Transportation: Unless otherwise stated by your physician, bring a driver. Morning Medicines: Except for blood thinners, take all of your other morning medications with a sip of water. Make sure to take your heart and blood pressure medicines. If your blood pressure's lower number is above 100, the case will be rescheduled. Blood thinners: Make sure to stop your blood thinners as instructed.  If you take a blood thinner, but were not instructed to stop it, call our office [phone] and ask to talk to a nurse. Not stopping a blood thinner prior to certain procedures could lead to serious complications. Diabetics on insulin: Notify the staff so that you can be scheduled 1st case in the morning. If your diabetes requires high dose insulin, take only  of your normal insulin dose the morning of the procedure and  notify the staff that you have done so. Preventing infections: Shower with an antibacterial soap the morning of your procedure.  Build-up your immune system: Take 1000 mg of Vitamin C with every meal (3 times a day) the day prior to your procedure. Antibiotics: Inform the nursing staff if you are taking any antibiotics or if you have any conditions that may require antibiotics prior to procedures. (Example: recent joint implants)   Pregnancy: If you are pregnant make sure to notify the nursing staff. Not doing so may result in injury to the fetus, including death.  Sickness: If you have a cold, fever, or any active infections, call and cancel or reschedule your procedure. Receiving steroids while having an infection may result in complications. Arrival: You must be in the facility at least 30 minutes prior to your scheduled procedure. Tardiness: Your scheduled time is also the cutoff time. If you do not arrive at least 15 minutes prior to your procedure, you will be rescheduled.  Children: Do not bring any children with you. Make arrangements to keep them home. Dress appropriately: There is always a possibility that your clothing may get soiled. Avoid long dresses. Valuables: Do not bring any jewelry or valuables.  Reasons to call and reschedule or cancel your procedure: (Following these recommendations will minimize the risk of a serious complication.) Surgeries: Avoid having procedures within 2 weeks of any surgery. (Avoid for 2 weeks before or after any surgery). Flu Shots: Avoid having procedures within 2 weeks of a flu shots or . (Avoid for 2 weeks before or after immunizations). Barium: Avoid having a procedure within 7-10 days after having had a radiological study involving the use of radiological contrast. (  Myelograms, Barium swallow or enema study). Heart attacks: Avoid any elective procedures or surgeries for the initial 6 months after a "Myocardial Infarction" (Heart Attack). Blood  thinners: It is imperative that you stop these medications before procedures. Let us know if you if you take any blood thinner.  Infection: Avoid procedures during or within two weeks of an infection (including chest colds or gastrointestinal problems). Symptoms associated with infections include: Localized redness, fever, chills, night sweats or profuse sweating, burning sensation when voiding, cough, congestion, stuffiness, runny nose, sore throat, diarrhea, nausea, vomiting, cold or Flu symptoms, recent or current infections. It is specially important if the infection is over the area that we intend to treat. Heart and lung problems: Symptoms that may suggest an active cardiopulmonary problem include: cough, chest pain, breathing difficulties or shortness of breath, dizziness, ankle swelling, uncontrolled high or unusually low blood pressure, and/or palpitations. If you are experiencing any of these symptoms, cancel your procedure and contact your primary care physician for an evaluation.  Remember:  Regular Business hours are:  Monday to Thursday 8:00 AM to 4:00 PM  Provider's Schedule: Delano Metz, MD:  Procedure days: Tuesday and Thursday 7:30 AM to 4:00 PM  Edward Jolly, MD:  Procedure days: Monday and Wednesday 7:30 AM to 4:00 PM  Epidural Steroid Injection Patient Information  Description: The epidural space surrounds the nerves as they exit the spinal cord.  In some patients, the nerves can be compressed and inflamed by a bulging disc or a tight spinal canal (spinal stenosis).  By injecting steroids into the epidural space, we can bring irritated nerves into direct contact with a potentially helpful medication.  These steroids act directly on the irritated nerves and can reduce swelling and inflammation which often leads to decreased pain.  Epidural steroids may be injected anywhere along the spine and from the neck to the low back depending upon the location of your pain.   After  numbing the skin with local anesthetic (like Novocaine), a small needle is passed into the epidural space slowly.  You may experience a sensation of pressure while this is being done.  The entire block usually last less than 10 minutes.  Conditions which may be treated by epidural steroids:  Low back and leg pain Neck and arm pain Spinal stenosis Post-laminectomy syndrome Herpes zoster (shingles) pain Pain from compression fractures  Preparation for the injection:  Do not eat any solid food or dairy products within 8 hours of your appointment.  You may drink clear liquids up to 3 hours before appointment.  Clear liquids include water, black coffee, juice or soda.  No milk or cream please. You may take your regular medication, including pain medications, with a sip of water before your appointment  Diabetics should hold regular insulin (if taken separately) and take 1/2 normal NPH dos the morning of the procedure.  Carry some sugar containing items with you to your appointment. A driver must accompany you and be prepared to drive you home after your procedure.  Bring all your current medications with your. An IV may be inserted and sedation may be given at the discretion of the physician.   A blood pressure cuff, EKG and other monitors will often be applied during the procedure.  Some patients may need to have extra oxygen administered for a short period. You will be asked to provide medical information, including your allergies, prior to the procedure.  We must know immediately if you are taking blood thinners (like  Coumadin/Warfarin)  Or if you are allergic to IV iodine contrast (dye). We must know if you could possible be pregnant.  Possible side-effects: Bleeding from needle site Infection (rare, may require surgery) Nerve injury (rare) Numbness & tingling (temporary) Difficulty urinating (rare, temporary) Spinal headache ( a headache worse with upright posture) Light -headedness  (temporary) Pain at injection site (several days) Decreased blood pressure (temporary) Weakness in arm/leg (temporary) Pressure sensation in back/neck (temporary)  Call if you experience: Fever/chills associated with headache or increased back/neck pain. Headache worsened by an upright position. New onset weakness or numbness of an extremity below the injection site Hives or difficulty breathing (go to the emergency room) Inflammation or drainage at the infection site Severe back/neck pain Any new symptoms which are concerning to you  Please note:  Although the local anesthetic injected can often make your back or neck feel good for several hours after the injection, the pain will likely return.  It takes 3-7 days for steroids to work in the epidural space.  You may not notice any pain relief for at least that one week.  If effective, we will often do a series of three injections spaced 3-6 weeks apart to maximally decrease your pain.  After the initial series, we generally will wait several months before considering a repeat injection of the same type.  If you have any questions, please call [phone] [facility]

## 2023-11-11 ENCOUNTER — Ambulatory Visit

## 2023-11-11 ENCOUNTER — Ambulatory Visit
Admission: RE | Admit: 2023-11-11 | Discharge: 2023-11-11 | Disposition: A | Discharge status: Home / Self Care | Source: Ambulatory Visit | Attending: Student in an Organized Health Care Education/Training Program | Admitting: Student in an Organized Health Care Education/Training Program

## 2023-11-11 DIAGNOSIS — G894 Chronic pain syndrome: Secondary | ICD-10-CM | POA: Diagnosis not present

## 2023-11-11 DIAGNOSIS — R200 Anesthesia of skin: Secondary | ICD-10-CM | POA: Diagnosis not present

## 2023-11-11 DIAGNOSIS — M542 Cervicalgia: Secondary | ICD-10-CM | POA: Insufficient documentation

## 2023-11-11 DIAGNOSIS — M5412 Radiculopathy, cervical region: Secondary | ICD-10-CM | POA: Insufficient documentation

## 2023-11-11 DIAGNOSIS — R202 Paresthesia of skin: Secondary | ICD-10-CM | POA: Diagnosis not present

## 2023-11-11 DIAGNOSIS — M4722 Other spondylosis with radiculopathy, cervical region: Secondary | ICD-10-CM | POA: Diagnosis not present

## 2023-11-11 DIAGNOSIS — M4802 Spinal stenosis, cervical region: Secondary | ICD-10-CM | POA: Diagnosis not present

## 2023-11-11 DIAGNOSIS — M47812 Spondylosis without myelopathy or radiculopathy, cervical region: Secondary | ICD-10-CM | POA: Diagnosis not present

## 2023-11-28 ENCOUNTER — Ambulatory Visit: Attending: Student in an Organized Health Care Education/Training Program | Admitting: Physical Therapy

## 2023-11-28 DIAGNOSIS — G894 Chronic pain syndrome: Secondary | ICD-10-CM | POA: Diagnosis not present

## 2023-11-28 DIAGNOSIS — M5412 Radiculopathy, cervical region: Secondary | ICD-10-CM | POA: Insufficient documentation

## 2023-11-28 DIAGNOSIS — M79601 Pain in right arm: Secondary | ICD-10-CM | POA: Diagnosis not present

## 2023-11-28 DIAGNOSIS — M542 Cervicalgia: Secondary | ICD-10-CM | POA: Diagnosis not present

## 2023-11-28 DIAGNOSIS — M25522 Pain in left elbow: Secondary | ICD-10-CM | POA: Diagnosis not present

## 2023-11-28 DIAGNOSIS — M6281 Muscle weakness (generalized): Secondary | ICD-10-CM | POA: Diagnosis not present

## 2023-11-28 DIAGNOSIS — M47812 Spondylosis without myelopathy or radiculopathy, cervical region: Secondary | ICD-10-CM | POA: Diagnosis not present

## 2023-11-28 NOTE — Therapy (Signed)
 OUTPATIENT PHYSICAL THERAPY NECK EVALUATION   Patient Name: Jeff Mcmahon MRN: [id number] DOB:05-12-1946, 78 y.o., male Today's Date: 11/28/2023  END OF SESSION:  PT End of Session - 11/28/23 1327     Visit Number 1    Number of Visits 13    Date for PT Re-Evaluation 01/12/24    Authorization Type Medicare A&B 2025    PT Start Time 1327    PT Stop Time 1410    PT Time Calculation (min) 43 min    Activity Tolerance Patient tolerated treatment well    Behavior During Therapy WFL for tasks assessed/performed             Past Medical History:  Diagnosis Date   Arthritis    Dysrhythmia 2007   H/O IRREGULAR HEART BEAT-SAW CARDIOLOGIST AND SAID IT WAS NOTHING TO BE CONCERNED WITH   GERD (gastroesophageal reflux disease)    H/O   Hyperlipidemia    Pre-diabetes    Past Surgical History:  Procedure Laterality Date   COLONOSCOPY     COLONOSCOPY WITH PROPOFOL  N/A 06/02/2020   Procedure: COLONOSCOPY WITH PROPOFOL ;  Surgeon: Selena Daily, MD;  Location: [facility] ENDOSCOPY;  Service: Gastroenterology;  Laterality: N/A;   ESOPHAGOGASTRODUODENOSCOPY     ESOPHAGOGASTRODUODENOSCOPY (EGD) WITH PROPOFOL  N/A 09/02/2020   Procedure: ESOPHAGOGASTRODUODENOSCOPY (EGD) WITH PROPOFOL ;  Surgeon: Selena Daily, MD;  Location: [facility] ENDOSCOPY;  Service: Gastroenterology;  Laterality: N/A;   EXCISION MASS NECK N/A 09/07/2016   Procedure: EXCISION MASS NECK;  Surgeon: Claudia Cuff, MD;  Location: [facility] ORS;  Service: General;  Laterality: N/A;   EXCISION OF BACK LESION N/A 09/07/2016   Procedure: EXCISION OF BACK LESION;  Surgeon: Claudia Cuff, MD;  Location: [facility] ORS;  Service: General;  Laterality: N/A;   MOUTH SURGERY     dental procedure   RETINAL LASER PROCEDURE Right 04/06/2021   [facility] in [place]   Patient Active Problem List   Diagnosis Date Noted   Cervical radicular pain 11/08/2023   Chronic pain syndrome 11/08/2023   Retinal tear of right eye  12/21/2021   Dysphagia    Cervical spondylosis 10/02/2015   Prediabetes 09/09/2015   History of colonic polyps 09/05/2015   Hyperlipidemia 09/04/2015   GERD (gastroesophageal reflux disease) 09/04/2015   Dry eyes 09/04/2015    PCP: Clarise Crooks, MD  REFERRING PROVIDER: Cephus Collin, MD  REFERRING DIAG:  M54.12 (ICD-10-CM) - Cervical radicular pain  M47.812 (ICD-10-CM) - Cervical spondylosis  G89.4 (ICD-10-CM) - Chronic pain syndrome  M54.2 (ICD-10-CM) - Cervicalgia    RATIONALE FOR EVALUATION AND TREATMENT: Rehabilitation  THERAPY DIAG: Cervicalgia  Pain in right arm  Muscle weakness (generalized)  ONSET DATE: Worsening pain since Feb 2024, chronic issue prior to that which was under control  FOLLOW-UP APPT SCHEDULED WITH REFERRING PROVIDER: Yes ; 12/07/23    SUBJECTIVE:  Chief Complaint: Pt is a 78 year old male referred for R-sided neck pain with R upper limb referral with atraumatic onset.   Pertinent History Pt is a 78 year old male referred for R-sided neck pain with R upper limb referral with atraumatic onset. Med hx including OA, GERD, HLD, pre-diabetes. MRI results have yet to be obtained from radiologist. Hx of C-spine X-rays with evidence of moderate degenerative changes and loss of lordosis.   Pt reports long-term problem that has gotten worse over last couple of months. He reports minor pain and motion deficits for approx 40 years. In Nov 2023, pt got a lot more stiffness in C-spine; R-sided neck pain primarily. Symptoms improved with Rx medication given by Dr. Augustus Ledger in the fall. Pt reports in February, symptoms got much worse, up to 10/10. Prednisone  resolved pain completely, but pain came back in a couple of days. Pt reports notable pain with turning his head. Pt will consider  ESI for 12/07/23. "Uneasiness" in neck at rest along posterior cervical spine. Symptoms are variable. Pt reports more issues with getting to sleep and with turning his head to change positions. Pt reports he can have posterior C-spine pain with wide jaw opening. No recent nausea, vomiting, vertigo, dizziness, visual changes.    Pain:  Pain Intensity: Present: 2/10, Best: 1/10, Worst: 10/10 Pain location: Posterior C-spine and occipital region Pain Quality: sharp  Radiating: No  Numbness/Tingling: Yes; numbness in arms with cervical extension, down to bilat forearms  Focal Weakness: No Aggravating factors: turning R more than L, lying flat in bed, turning over in bed, looking overhead Relieving factors: keeping head in pain-free range, sitting with good head support, limiting ROM, heat, Bengay 24-hour pain behavior: worse at night time   History of prior neck injury, pain, surgery, or therapy: Yes;  Hx of RTC-related pain in both shoulders Dominant hand: left  Imaging: Yes ; *Cervical spine X-rays 1. Moderate degenerative changes. No evidence for acute abnormality. 2. Loss of cervical lordosis.  *Cervical spine MRI completed 11/11/23 - awaiting release of radiologist report   Red flags (personal history of cancer, h/o spinal tumors, history of compression fracture, chills/fever, night sweats, nausea, vomiting, unrelenting pain): Negative   PRECAUTIONS: None  WEIGHT BEARING RESTRICTIONS: No  FALLS: Has patient fallen in last 6 months? No  Living Environment Lives with: lives with their spouse; son lives 20 minutes away  Lives in: House/apartment   Prior level of function: Independent  Occupational demands: Retired  Hobbies: Multimedia programmer, able to throw baseball with son/grandkids  Patient Goals: Able to resolve pain    OBJECTIVE:   Patient Surveys  NDI 10/50 = 20%  Cognition Patient is oriented to person, place, and time.  Recent memory is intact.  Remote memory is  intact.  Attention span and concentration are intact.  Expressive speech is intact.  Patient's fund of knowledge is within normal limits for educational level.    Gross Musculoskeletal Assessment Tremor: None Bulk: Normal Tone: Normal   Posture Increased thoracic kyphosis, Mild Dowager's hump, forward head. Resting in posterior pelvic tilt/kyphotic posture in sitting.   AROM AROM (Normal range in degrees) AROM 11/28/23  Cervical  Flexion (50) 25  Extension (80) 13* (Muscles "grab" back of neck)  Right lateral flexion (45) 15*  Left lateral flexion (45) 16*  Right rotation (85) 42  Left rotation (85) 39  (* = pain; Blank rows = not tested)   MMT MMT (out of 5) Right 11/28/23 Left 11/28/23  Cervical (isometric)  Flexion WNL (  numbness reproduced bilat forearms)  Extension WNL  Lateral Flexion WNL WNL  Rotation WNL*(numbness reproduced bilat forearms) WNL*      Shoulder   Flexion 4+ 4+  Extension    Abduction 4+ 4+* ( L shoulder pain)  Internal rotation 4+ 4+  External rotation  4+ 4+  Horizontal abduction    Horizontal adduction    Lower Trapezius    Rhomboids        Elbow  Flexion 5 5  Extension 5 5  Pronation    Supination        Wrist  Flexion 4+ 4+  Extension 5 5  Radial deviation    Ulnar deviation        (* = pain; Blank rows = not tested)  Sensation Grossly intact to light touch bilateral UE as determined by testing dermatomes C2-T2. Proprioception and hot/cold testing deferred on this date.  Reflexes R/L Elbow: 1+/1+  Brachioradialis: 2+/2+  Tricep: 2+/2+ UMN signs: Negative (Hoffman's and Clonus)  Palpation Location LEFT  RIGHT           Suboccipitals 1 1  Cervical paraspinals 1 1  Upper Trapezius 1 (n/t reproduced) 1 (n/t reproduced)  Levator Scapulae    Rhomboid Major/Minor    (Blank rows = not tested) Graded on 0-4 scale (0 = no pain, 1 = pain, 2 = pain with wincing/grimacing/flinching, 3 = pain with withdrawal, 4 = unwilling to  allow palpation), (Blank rows = not tested)  Repeated Movements Repeated retraction: Mild pressure in back of head, no worse after  Passive Accessory Intervertebral Motion Significant hypomobility C3-C6, decreased sideglide bilaterally in mid to lower C-spine with pain at restriction   SPECIAL TESTS Spurlings A (ipsilateral lateral flexion/axial compression): R: Positive for neck pain L: Positive for neck pain Spurlings B (ipsilateral lateral flexion/contralateral rotation/axial compression): R: Negative L: Negative Cervical compression: Reproduction of N/T Distraction Test: No pain, residual numbness that is not resolved  Hoffman Sign (cervical cord compression): R: Negative L: Negative ULTT Median: R: Negative L: Negative ULTT Ulnar: R: Positive for neck pain L: Negative ULTT Radial: R: Negative L: Negative   TODAY'S TREATMENT    Therapeutic Exercise - for HEP establishment, discussion on appropriate exercise/activity modification, PT education   Reviewed baseline home exercises and provided handout for Med[employer] program (see Access Code); tactile cueing and therapist demonstration utilized as needed for carryover of proper technique to HEP.    Patient education on current condition, anatomy involved, prognosis, plan of care. Discussion on activity modification to prevent flare-up of condition, including avoidance of positions/postures that increase numbness/tingling.     PATIENT EDUCATION:  Education details: see above for patient education details Person educated: Patient Education method: Explanation, Demonstration, and Handouts Education comprehension: verbalized understanding and returned demonstration   HOME EXERCISE PROGRAM:  Access Code: [email or web address] URL: [email or web address] Date: 11/29/2023 Prepared by: Denese Finn  Exercises - Seated Cervical Retraction  - 5-6 x daily - 7 x weekly - 1 sets - 10 reps - 1sec hold   ASSESSMENT:  CLINICAL  IMPRESSION: Patient is an active 78 y.o. male who was seen today for physical therapy evaluation and treatment for neck pain with more pain on right side. Pt also has bilateral numbness/tingling down to proximal forearms that is reproduced with cervical flexion and R rotation. Unexpectedly, cervical traction in neutral and flexed position leads to increase in numbness/tingling. Pt has clear signs and symptoms of cervical radiculopathy and X-ray confirmed degenerative changes in cervical spine with loss of  lordosis. Pt is awaiting results of recent MRI from radiologist. Pt has readily reproducible paresthesias with no notable pain or worsening paresthesias with cervical retraction. We will further check for response with retraction next visit and further update HEP. Pt has current deficits in C-spine AROM, mid to lower cervical spine stiffness/hypomobility, decreased tolerance to loading bilat deltoid/RTC, and postural changes/upper crossed presentation. Pt will continue to benefit from skilled PT services to address deficits and improve function.   OBJECTIVE IMPAIRMENTS: decreased ROM, decreased strength, hypomobility, impaired flexibility, impaired UE functional use, postural dysfunction, and pain.   ACTIVITY LIMITATIONS: carrying, lifting, bending, sleeping, transfers, bed mobility, and reach over head  PARTICIPATION LIMITATIONS: meal prep, driving, shopping, community activity, golfing, and throwing baseball with son/grandson  PERSONAL FACTORS: Age, Past/current experiences, Time since onset of injury/illness/exacerbation, and 3+ comorbidities: (OA, HLD, pre-diabetes, chronic neck pain with acute flare-up)  are also affecting patient's functional outcome.   REHAB POTENTIAL: Good  CLINICAL DECISION MAKING: Unstable/unpredictable  EVALUATION COMPLEXITY: High   GOALS: Goals reviewed with patient? Yes  SHORT TERM GOALS: Target date: 12/19/2023  Pt will be independent with HEP to improve  strength and decrease neck pain to improve pain-free function at home and work. Baseline: 11/28/23: Baseline HEP initiated.  Goal status: INITIAL   LONG TERM GOALS: Target date: 01/12/2024  Pt will have cervical spine flexion/extension AROM to 30-35 deg or greater and C-spine rotation AROM to 60 deg or greater bilaterally as needed for completion of ADLs without functional limitation Baseline: 11/28/23:  Goal status: INITIAL  2.  Pt will decrease worst neck pain by at least 2 points on the NPRS in order to demonstrate clinically significant reduction in neck pain. Baseline: 11/28/23: 10/10 at worst.  Goal status: INITIAL  3.  Pt will decrease NDI score by at least 19% in order demonstrate clinically significant reduction in neck pain/disability.       Baseline: 11/28/23: 10/50 = 20% Goal status: INITIAL  4.  Pt will demonstrate golf swing and overhead throw with normal mechanics and no reproduction of upper quarter pain as needed for participation in golf/throwing baseball with family. Baseline: 11/28/23: Pt currently unable to swing golf club or throw.  Goal status: INITIAL   PLAN: PT FREQUENCY: 1-2x/week  PT DURATION: 6 weeks  PLANNED INTERVENTIONS: Therapeutic exercises, Therapeutic activity, Neuromuscular re-education, Balance training, Gait training, Patient/Family education, Self Care, Joint mobilization, Joint manipulation, Vestibular training, Canalith repositioning, Orthotic/Fit training, DME instructions, Dry Needling, Electrical stimulation, Spinal manipulation, Spinal mobilization, Cryotherapy, Moist heat, Taping, Traction, Ultrasound, Ionotophoresis 4mg /ml Dexamethasone, Manual therapy, and Re-evaluation.  PLAN FOR NEXT SESSION: Re-test repeated cervical retraction. Test traction in sidelying or seated position for potential relief of neuralgias. Utilize manual therapy/mobilization techniques for C-spine mobility and progress with postural re-education drills as tolerated.     Denese Finn, PT, DPT #[id number]  Aleatha Hunting, PT 11/28/2023, 1:27 PM

## 2023-11-29 ENCOUNTER — Encounter: Payer: Self-pay | Admitting: Physical Therapy

## 2023-11-30 ENCOUNTER — Ambulatory Visit: Admitting: Physical Therapy

## 2023-11-30 DIAGNOSIS — M79601 Pain in right arm: Secondary | ICD-10-CM

## 2023-11-30 DIAGNOSIS — M6281 Muscle weakness (generalized): Secondary | ICD-10-CM | POA: Diagnosis not present

## 2023-11-30 DIAGNOSIS — M542 Cervicalgia: Secondary | ICD-10-CM | POA: Diagnosis not present

## 2023-11-30 DIAGNOSIS — M47812 Spondylosis without myelopathy or radiculopathy, cervical region: Secondary | ICD-10-CM | POA: Diagnosis not present

## 2023-11-30 DIAGNOSIS — M5412 Radiculopathy, cervical region: Secondary | ICD-10-CM | POA: Diagnosis not present

## 2023-11-30 DIAGNOSIS — M25522 Pain in left elbow: Secondary | ICD-10-CM | POA: Diagnosis not present

## 2023-11-30 NOTE — Therapy (Signed)
 OUTPATIENT PHYSICAL THERAPY NECK TREATMENT   Patient Name: Jeff Mcmahon MRN: [id number] DOB:Jan 25, 1946, 78 y.o., male Today's Date: 11/30/2023  END OF SESSION:  PT End of Session - 11/30/23 1328     Visit Number 2    Number of Visits 13    Date for PT Re-Evaluation 01/12/24    Authorization Type Medicare A&B 2025    PT Start Time 1329    PT Stop Time 1427    PT Time Calculation (min) 58 min    Activity Tolerance Patient tolerated treatment well    Behavior During Therapy WFL for tasks assessed/performed              Past Medical History:  Diagnosis Date   Arthritis    Dysrhythmia 2007   H/O IRREGULAR HEART BEAT-SAW CARDIOLOGIST AND SAID IT WAS NOTHING TO BE CONCERNED WITH   GERD (gastroesophageal reflux disease)    H/O   Hyperlipidemia    Pre-diabetes    Past Surgical History:  Procedure Laterality Date   COLONOSCOPY     COLONOSCOPY WITH PROPOFOL  N/A 06/02/2020   Procedure: COLONOSCOPY WITH PROPOFOL ;  Surgeon: Selena Daily, MD;  Location: [facility];  Service: Gastroenterology;  Laterality: N/A;   ESOPHAGOGASTRODUODENOSCOPY     ESOPHAGOGASTRODUODENOSCOPY (EGD) WITH PROPOFOL  N/A 09/02/2020   Procedure: ESOPHAGOGASTRODUODENOSCOPY (EGD) WITH PROPOFOL ;  Surgeon: Selena Daily, MD;  Location: [facility];  Service: Gastroenterology;  Laterality: N/A;   EXCISION MASS NECK N/A 09/07/2016   Procedure: EXCISION MASS NECK;  Surgeon: Claudia Cuff, MD;  Location: [facility];  Service: General;  Laterality: N/A;   EXCISION OF BACK LESION N/A 09/07/2016   Procedure: EXCISION OF BACK LESION;  Surgeon: Claudia Cuff, MD;  Location: [facility];  Service: General;  Laterality: N/A;   MOUTH SURGERY     dental procedure   RETINAL LASER PROCEDURE Right 04/06/2021   [facility] in [place]   Patient Active Problem List   Diagnosis Date Noted   Cervical radicular pain 11/08/2023   Chronic pain syndrome 11/08/2023   Retinal tear of right  eye 12/21/2021   Dysphagia    Cervical spondylosis 10/02/2015   Prediabetes 09/09/2015   History of colonic polyps 09/05/2015   Hyperlipidemia 09/04/2015   GERD (gastroesophageal reflux disease) 09/04/2015   Dry eyes 09/04/2015    PCP: Clarise Crooks, MD  REFERRING PROVIDER: Cephus Collin, MD  REFERRING DIAG:  M54.12 (ICD-10-CM) - Cervical radicular pain  M47.812 (ICD-10-CM) - Cervical spondylosis  G89.4 (ICD-10-CM) - Chronic pain syndrome  M54.2 (ICD-10-CM) - Cervicalgia    RATIONALE FOR EVALUATION AND TREATMENT: Rehabilitation  THERAPY DIAG: Cervicalgia  Pain in right arm  Muscle weakness (generalized)  ONSET DATE: Worsening pain since Feb 2024, chronic issue prior to that which was under control  FOLLOW-UP APPT SCHEDULED WITH REFERRING PROVIDER: Yes ; 12/07/23   PERTINENT HISTORY:  Pt is a 78 year old male referred for R-sided neck pain with R upper limb referral with atraumatic onset. Med hx including OA, GERD, HLD, pre-diabetes. MRI results have yet to be obtained from radiologist. Hx of C-spine X-rays with evidence of moderate degenerative changes and loss of lordosis.   Pt reports long-term problem that has gotten worse over last couple of months. He reports minor pain and motion deficits for approx 40 years. In Nov 2023, pt got a lot more stiffness in C-spine; R-sided neck pain primarily. Symptoms improved with Rx medication given by Dr. Augustus Ledger in the fall. Pt reports in February, symptoms got much  worse, up to 10/10. Prednisone  resolved pain completely, but pain came back in a couple of days. Pt reports notable pain with turning his head. Pt will consider ESI for 12/07/23. "Uneasiness" in neck at rest along posterior cervical spine. Symptoms are variable. Pt reports more issues with getting to sleep and with turning his head to change positions. Pt reports he can have posterior C-spine pain with wide jaw opening. No recent nausea, vomiting, vertigo, dizziness, visual  changes.    Pain:  Pain Intensity: Present: 2/10, Best: 1/10, Worst: 10/10 Pain location: Posterior C-spine and occipital region Pain Quality: sharp  Radiating: No  Numbness/Tingling: Yes; numbness in arms with cervical extension, down to bilat forearms  Focal Weakness: No Aggravating factors: turning R more than L, lying flat in bed, turning over in bed, looking overhead Relieving factors: keeping head in pain-free range, sitting with good head support, limiting ROM, heat, Bengay 24-hour pain behavior: worse at night time   History of prior neck injury, pain, surgery, or therapy: Yes;  Hx of RTC-related pain in both shoulders Dominant hand: left  Imaging: Yes ; *Cervical spine X-rays 1. Moderate degenerative changes. No evidence for acute abnormality. 2. Loss of cervical lordosis.  *Cervical spine MRI completed 11/11/23 - awaiting release of radiologist report   Red flags (personal history of cancer, h/o spinal tumors, history of compression fracture, chills/fever, night sweats, nausea, vomiting, unrelenting pain): Negative   PRECAUTIONS: None  WEIGHT BEARING RESTRICTIONS: No  FALLS: Has patient fallen in last 6 months? No  Living Environment Lives with: lives with their spouse; son lives 20 minutes away  Lives in: House/apartment   Prior level of function: Independent  Occupational demands: Retired  Hobbies: Multimedia programmer, able to throw baseball with son/grandkids  Patient Goals: Able to resolve pain     OBJECTIVE (data from initial evaluation unless otherwise dated):    Patient Surveys  NDI 10/50 = 20%  Posture Increased thoracic kyphosis, Mild Dowager's hump, forward head. Resting in posterior pelvic tilt/kyphotic posture in sitting.   AROM AROM (Normal range in degrees) AROM 11/28/23  Cervical  Flexion (50) 25  Extension (80) 13* (Muscles "grab" back of neck)  Right lateral flexion (45) 15*  Left lateral flexion (45) 16*  Right rotation (85) 42  Left  rotation (85) 39  (* = pain; Blank rows = not tested)   MMT MMT (out of 5) Right 11/28/23 Left 11/28/23  Cervical (isometric)  Flexion WNL (numbness reproduced bilat forearms)  Extension WNL  Lateral Flexion WNL WNL  Rotation WNL*(numbness reproduced bilat forearms) WNL*      Shoulder   Flexion 4+ 4+  Extension    Abduction 4+ 4+* ( L shoulder pain)  Internal rotation 4+ 4+  External rotation  4+ 4+  Horizontal abduction    Horizontal adduction    Lower Trapezius    Rhomboids        Elbow  Flexion 5 5  Extension 5 5  Pronation    Supination        Wrist  Flexion 4+ 4+  Extension 5 5  Radial deviation    Ulnar deviation        (* = pain; Blank rows = not tested)  Sensation Grossly intact to light touch bilateral UE as determined by testing dermatomes C2-T2. Proprioception and hot/cold testing deferred on this date.  Reflexes R/L Elbow: 1+/1+  Brachioradialis: 2+/2+  Tricep: 2+/2+ UMN signs: Negative (Hoffman's and Clonus)  Palpation Location LEFT  RIGHT  Suboccipitals 1 1  Cervical paraspinals 1 1  Upper Trapezius 1 (n/t reproduced) 1 (n/t reproduced)  Levator Scapulae    Rhomboid Major/Minor    (Blank rows = not tested) Graded on 0-4 scale (0 = no pain, 1 = pain, 2 = pain with wincing/grimacing/flinching, 3 = pain with withdrawal, 4 = unwilling to allow palpation), (Blank rows = not tested)  Repeated Movements Repeated retraction: Mild pressure in back of head, no worse after  Passive Accessory Intervertebral Motion Significant hypomobility C3-C6, decreased sideglide bilaterally in mid to lower C-spine with pain at restriction   SPECIAL TESTS Spurlings A (ipsilateral lateral flexion/axial compression): R: Positive for neck pain L: Positive for neck pain Spurlings B (ipsilateral lateral flexion/contralateral rotation/axial compression): R: Negative L: Negative Cervical compression: Reproduction of N/T Distraction Test: No pain, residual  numbness that is not resolved  Hoffman Sign (cervical cord compression): R: Negative L: Negative ULTT Median: R: Negative L: Negative ULTT Ulnar: R: Positive for neck pain L: Negative ULTT Radial: R: Negative L: Negative   TODAY'S TREATMENT    SUBJECTIVE STATEMENT:   Patent reports notable stiffness at arrival and lack of motion. He reports minor symptoms at arrival. He reports some pain in back of head/upper cervical region with retraction. He reports attempting shoulder exercises previously given for RTC - they did lead to notable numbness. Pt has cervical spine ESI scheduled for 12/07/23.    Therapeutic Exercise - for improved soft tissue flexibility and extensibility as needed for ROM, improved strength as needed to improve performance of CKC activities/functional movements  Repeated cervical retraction, seated; 1 x 10  -reproduction of bilateral numbness during, some remaining after (increase in arm paresthesia)  -Pt has numbness initially with supine lying on two pillows*  Supine with head raised 20 deg, completed various repeated movement strategies: Repeated cervical retraction, supine; 1 x 10  -reproduction of bilateral numbness, mildly remaining after  -minor numbness remaining after rest period  Repeated cervical flexion; 1 x 10  -numbness LUE>RUE   -after rest period 1 min, numbness remaining in L arm  Repeated cervical sidebend to L; 1 x 10   -numbness remaining in LUE  Repeated cervical sidebend to L, in supine; 2 x 10   -numbness further in L upper limb  Repeated cervical flexion; 1 x 10  -increase in L>RUE numbness   Scapular retractions; 1 x 10 and 1x6, 3 sec hold  -decreased reps on second set due to report of L UT pain Median nerve flossing; 1x10 on either side   -heavy verbal cueing and demonstration for technique, limited elbow/wrist extension exhibited by pt   PATIENT EDUCATION: Discussed repeated motion principles and holding on repeated movement that  consistently peripheralizes symptoms with more peripheral symptoms remaining after. Discussed holding on repeated motion and utilizing self-traction, neuromobilization, and simple scapular retraction isometrics for HEP at this time. HEP updated and new printout given to patient.    Manual Therapy - for symptom modulation, soft tissue sensitivity and mobility, joint mobility, ROM   Cervical traction with pt sidelying L; 10 sec intervals; 2 bouts x 3 minutes  Bilateral cervical sideglides, C3-5 gr III for improved mobility; x30 sec on each side   -increase in numbess in L>R arm with R to L sideglide    PA mobilization in supine, using PT's 1st MCPs on each articular pillar   -numbness bilaterally increased with CPA at C4-6      PATIENT EDUCATION:  Education details: see above for patient education  details Person educated: Patient Education method: Explanation, Demonstration, and Handouts Education comprehension: verbalized understanding and returned demonstration   HOME EXERCISE PROGRAM:  Access Code: [email or web address] URL: [email or web address] Date: 11/30/2023 Prepared by: Denese Finn  Exercises - Seated Self Cervical Traction  - 2 x daily - 7 x weekly - 2 sets - 10 reps - Median Nerve Flossing - Tray  - 2 x daily - 7 x weekly - 2 sets - 10 reps - 1sec hold - Seated Scapular Retraction  - 2 x daily - 7 x weekly - 2-3 sets - 5-8 reps - 3sec hold   ASSESSMENT:  CLINICAL IMPRESSION: Patient's radiculopathy does apparently respond to mechanical therapy/movement strategies, but he has increase in L>R arm numbness with various directions and has frequent re-occurrence of numbness with manual therapy techniques. Pt has significant bilateral paresthesias to forearm level with CPA from C4-6 levels. We modified program today to hold on repeated retraction and utilize self-traction, neuromobilization, and simple periscapular isometrics as tolerated with modified rep scheme prn for  improved tolerance. We will re-visit repeated motion strategies with future visits as tolerated. Pt is awaiting ESI next Wednesday 12/07/23. Pt has current deficits in C-spine AROM, mid to lower cervical spine stiffness/hypomobility, decreased tolerance to loading bilat deltoid/RTC, and postural changes/upper crossed presentation. Pt will continue to benefit from skilled PT services to address deficits and improve function.   OBJECTIVE IMPAIRMENTS: decreased ROM, decreased strength, hypomobility, impaired flexibility, impaired UE functional use, postural dysfunction, and pain.   ACTIVITY LIMITATIONS: carrying, lifting, bending, sleeping, transfers, bed mobility, and reach over head  PARTICIPATION LIMITATIONS: meal prep, driving, shopping, community activity, golfing, and throwing baseball with son/grandson  PERSONAL FACTORS: Age, Past/current experiences, Time since onset of injury/illness/exacerbation, and 3+ comorbidities: (OA, HLD, pre-diabetes, chronic neck pain with acute flare-up)  are also affecting patient's functional outcome.   REHAB POTENTIAL: Good  CLINICAL DECISION MAKING: Unstable/unpredictable  EVALUATION COMPLEXITY: High   GOALS: Goals reviewed with patient? Yes  SHORT TERM GOALS: Target date: 12/19/2023  Pt will be independent with HEP to improve strength and decrease neck pain to improve pain-free function at home and work. Baseline: 11/28/23: Baseline HEP initiated.  Goal status: INITIAL   LONG TERM GOALS: Target date: 01/12/2024  Pt will have cervical spine flexion/extension AROM to 30-35 deg or greater and C-spine rotation AROM to 60 deg or greater bilaterally as needed for completion of ADLs without functional limitation Baseline: 11/28/23:  Goal status: INITIAL  2.  Pt will decrease worst neck pain by at least 2 points on the NPRS in order to demonstrate clinically significant reduction in neck pain. Baseline: 11/28/23: 10/10 at worst.  Goal status: INITIAL  3.   Pt will decrease NDI score by at least 19% in order demonstrate clinically significant reduction in neck pain/disability.       Baseline: 11/28/23: 10/50 = 20% Goal status: INITIAL  4.  Pt will demonstrate golf swing and overhead throw with normal mechanics and no reproduction of upper quarter pain as needed for participation in golf/throwing baseball with family. Baseline: 11/28/23: Pt currently unable to swing golf club or throw.  Goal status: INITIAL   PLAN: PT FREQUENCY: 1-2x/week  PT DURATION: 6 weeks  PLANNED INTERVENTIONS: Therapeutic exercises, Therapeutic activity, Neuromuscular re-education, Balance training, Gait training, Patient/Family education, Self Care, Joint mobilization, Joint manipulation, Vestibular training, Canalith repositioning, Orthotic/Fit training, DME instructions, Dry Needling, Electrical stimulation, Spinal manipulation, Spinal mobilization, Cryotherapy, Moist heat, Taping, Traction, Ultrasound, Ionotophoresis 4mg /ml Dexamethasone, Manual therapy, and Re-evaluation.  PLAN FOR NEXT SESSION: Test traction in sidelying or seated position for potential relief of neuralgias. Utilize manual therapy/mobilization techniques for C-spine mobility and progress with postural re-education drills as tolerated. Continue with neuromobilization techniques as tolerated.    Denese Finn, PT, DPT #[id number]  Aleatha Hunting, PT 11/30/2023, 2:30 PM

## 2023-12-05 ENCOUNTER — Ambulatory Visit: Admitting: Physical Therapy

## 2023-12-05 DIAGNOSIS — M5412 Radiculopathy, cervical region: Secondary | ICD-10-CM | POA: Diagnosis not present

## 2023-12-05 DIAGNOSIS — M542 Cervicalgia: Secondary | ICD-10-CM

## 2023-12-05 DIAGNOSIS — M47812 Spondylosis without myelopathy or radiculopathy, cervical region: Secondary | ICD-10-CM | POA: Diagnosis not present

## 2023-12-05 DIAGNOSIS — M79601 Pain in right arm: Secondary | ICD-10-CM | POA: Diagnosis not present

## 2023-12-05 DIAGNOSIS — M25522 Pain in left elbow: Secondary | ICD-10-CM

## 2023-12-05 DIAGNOSIS — M6281 Muscle weakness (generalized): Secondary | ICD-10-CM | POA: Diagnosis not present

## 2023-12-05 NOTE — Therapy (Unsigned)
 OUTPATIENT PHYSICAL THERAPY NECK TREATMENT   Patient Name: Jeff Mcmahon MRN: [id number] DOB:03/25/1946, 78 y.o., male Today's Date: 12/05/2023  END OF SESSION:  PT End of Session - 12/05/23 1332     Visit Number 3    Number of Visits 13    Date for PT Re-Evaluation 01/12/24    Authorization Type Medicare A&B 2025    PT Start Time 1332    PT Stop Time 1415    PT Time Calculation (min) 43 min    Activity Tolerance Patient tolerated treatment well    Behavior During Therapy WFL for tasks assessed/performed               Past Medical History:  Diagnosis Date   Arthritis    Dysrhythmia 2007   H/O IRREGULAR HEART BEAT-SAW CARDIOLOGIST AND SAID IT WAS NOTHING TO BE CONCERNED WITH   GERD (gastroesophageal reflux disease)    H/O   Hyperlipidemia    Pre-diabetes    Past Surgical History:  Procedure Laterality Date   COLONOSCOPY     COLONOSCOPY WITH PROPOFOL  N/A 06/02/2020   Procedure: COLONOSCOPY WITH PROPOFOL ;  Surgeon: Selena Daily, MD;  Location: [facility];  Service: Gastroenterology;  Laterality: N/A;   ESOPHAGOGASTRODUODENOSCOPY     ESOPHAGOGASTRODUODENOSCOPY (EGD) WITH PROPOFOL  N/A 09/02/2020   Procedure: ESOPHAGOGASTRODUODENOSCOPY (EGD) WITH PROPOFOL ;  Surgeon: Selena Daily, MD;  Location: [facility];  Service: Gastroenterology;  Laterality: N/A;   EXCISION MASS NECK N/A 09/07/2016   Procedure: EXCISION MASS NECK;  Surgeon: Claudia Cuff, MD;  Location: [facility];  Service: General;  Laterality: N/A;   EXCISION OF BACK LESION N/A 09/07/2016   Procedure: EXCISION OF BACK LESION;  Surgeon: Claudia Cuff, MD;  Location: [facility];  Service: General;  Laterality: N/A;   MOUTH SURGERY     dental procedure   RETINAL LASER PROCEDURE Right 04/06/2021   [facility] in [place]   Patient Active Problem List   Diagnosis Date Noted   Cervical radicular pain 11/08/2023   Chronic pain syndrome 11/08/2023   Retinal tear of right  eye 12/21/2021   Dysphagia    Cervical spondylosis 10/02/2015   Prediabetes 09/09/2015   History of colonic polyps 09/05/2015   Hyperlipidemia 09/04/2015   GERD (gastroesophageal reflux disease) 09/04/2015   Dry eyes 09/04/2015    PCP: Clarise Crooks, MD  REFERRING PROVIDER: Cephus Collin, MD  REFERRING DIAG:  M54.12 (ICD-10-CM) - Cervical radicular pain  M47.812 (ICD-10-CM) - Cervical spondylosis  G89.4 (ICD-10-CM) - Chronic pain syndrome  M54.2 (ICD-10-CM) - Cervicalgia    RATIONALE FOR EVALUATION AND TREATMENT: Rehabilitation  THERAPY DIAG: Cervicalgia  Pain in right arm  Muscle weakness (generalized)  Pain in left elbow  ONSET DATE: Worsening pain since Feb 2024, chronic issue prior to that which was under control  FOLLOW-UP APPT SCHEDULED WITH REFERRING PROVIDER: Yes ; 12/07/23   PERTINENT HISTORY:  Pt is a 78 year old male referred for R-sided neck pain with R upper limb referral with atraumatic onset. Med hx including OA, GERD, HLD, pre-diabetes. MRI results have yet to be obtained from radiologist. Hx of C-spine X-rays with evidence of moderate degenerative changes and loss of lordosis.   Pt reports long-term problem that has gotten worse over last couple of months. He reports minor pain and motion deficits for approx 40 years. In Nov 2023, pt got a lot more stiffness in C-spine; R-sided neck pain primarily. Symptoms improved with Rx medication given by Dr. Augustus Ledger in the fall. Pt  reports in February, symptoms got much worse, up to 10/10. Prednisone  resolved pain completely, but pain came back in a couple of days. Pt reports notable pain with turning his head. Pt will consider ESI for 12/07/23. "Uneasiness" in neck at rest along posterior cervical spine. Symptoms are variable. Pt reports more issues with getting to sleep and with turning his head to change positions. Pt reports he can have posterior C-spine pain with wide jaw opening. No recent nausea, vomiting, vertigo,  dizziness, visual changes.    Pain:  Pain Intensity: Present: 2/10, Best: 1/10, Worst: 10/10 Pain location: Posterior C-spine and occipital region Pain Quality: sharp  Radiating: No  Numbness/Tingling: Yes; numbness in arms with cervical extension, down to bilat forearms  Focal Weakness: No Aggravating factors: turning R more than L, lying flat in bed, turning over in bed, looking overhead Relieving factors: keeping head in pain-free range, sitting with good head support, limiting ROM, heat, Bengay 24-hour pain behavior: worse at night time   History of prior neck injury, pain, surgery, or therapy: Yes;  Hx of RTC-related pain in both shoulders Dominant hand: left  Imaging: Yes ; *Cervical spine X-rays 1. Moderate degenerative changes. No evidence for acute abnormality. 2. Loss of cervical lordosis.  *Cervical spine MRI completed 11/11/23 - awaiting release of radiologist report   Red flags (personal history of cancer, h/o spinal tumors, history of compression fracture, chills/fever, night sweats, nausea, vomiting, unrelenting pain): Negative   PRECAUTIONS: None  WEIGHT BEARING RESTRICTIONS: No  FALLS: Has patient fallen in last 6 months? No  Living Environment Lives with: lives with their spouse; son lives 20 minutes away  Lives in: House/apartment   Prior level of function: Independent  Occupational demands: Retired  Hobbies: Multimedia programmer, able to throw baseball with son/grandkids  Patient Goals: Able to resolve pain     OBJECTIVE (data from initial evaluation unless otherwise dated):    Patient Surveys  NDI 10/50 = 20%  Posture Increased thoracic kyphosis, Mild Dowager's hump, forward head. Resting in posterior pelvic tilt/kyphotic posture in sitting.   AROM AROM (Normal range in degrees) AROM 11/28/23  Cervical  Flexion (50) 25  Extension (80) 13* (Muscles "grab" back of neck)  Right lateral flexion (45) 15*  Left lateral flexion (45) 16*  Right rotation  (85) 42  Left rotation (85) 39  (* = pain; Blank rows = not tested)   MMT MMT (out of 5) Right 11/28/23 Left 11/28/23  Cervical (isometric)  Flexion WNL (numbness reproduced bilat forearms)  Extension WNL  Lateral Flexion WNL WNL  Rotation WNL*(numbness reproduced bilat forearms) WNL*      Shoulder   Flexion 4+ 4+  Extension    Abduction 4+ 4+* ( L shoulder pain)  Internal rotation 4+ 4+  External rotation  4+ 4+  Horizontal abduction    Horizontal adduction    Lower Trapezius    Rhomboids        Elbow  Flexion 5 5  Extension 5 5  Pronation    Supination        Wrist  Flexion 4+ 4+  Extension 5 5  Radial deviation    Ulnar deviation        (* = pain; Blank rows = not tested)  Sensation Grossly intact to light touch bilateral UE as determined by testing dermatomes C2-T2. Proprioception and hot/cold testing deferred on this date.  Reflexes R/L Elbow: 1+/1+  Brachioradialis: 2+/2+  Tricep: 2+/2+ UMN signs: Negative (Hoffman's and Clonus)  Palpation Location  LEFT  RIGHT           Suboccipitals 1 1  Cervical paraspinals 1 1  Upper Trapezius 1 (n/t reproduced) 1 (n/t reproduced)  Levator Scapulae    Rhomboid Major/Minor    (Blank rows = not tested) Graded on 0-4 scale (0 = no pain, 1 = pain, 2 = pain with wincing/grimacing/flinching, 3 = pain with withdrawal, 4 = unwilling to allow palpation), (Blank rows = not tested)  Repeated Movements Repeated retraction: Mild pressure in back of head, no worse after  Passive Accessory Intervertebral Motion Significant hypomobility C3-C6, decreased sideglide bilaterally in mid to lower C-spine with pain at restriction   SPECIAL TESTS Spurlings A (ipsilateral lateral flexion/axial compression): R: Positive for neck pain L: Positive for neck pain Spurlings B (ipsilateral lateral flexion/contralateral rotation/axial compression): R: Negative L: Negative Cervical compression: Reproduction of N/T Distraction Test: No pain,  residual numbness that is not resolved  Hoffman Sign (cervical cord compression): R: Negative L: Negative ULTT Median: R: Negative L: Negative ULTT Ulnar: R: Positive for neck pain L: Negative ULTT Radial: R: Negative L: Negative   TODAY'S TREATMENT    SUBJECTIVE STATEMENT:   Patent got results from radiologist's results for C-spine MRI. Patient reports notable numbness this AM with performance of L upper limb median nerve flossing. Pt reports some pain along bilateral lateral paracervical musculature with self-traction. Patient reports overall less pain since last Wednesday. Patient reports some residual numbness in L upper limb with nerve gliding exercise earlier.    Therapeutic Exercise - for improved soft tissue flexibility and extensibility as needed for ROM, improved strength as needed to improve performance of CKC activities/functional movements  Repeated cervical retraction, seated; 1 x 10  -reproduction of bilateral numbness during, some remaining after (increase in arm paresthesia)  -Pt has numbness initially with supine lying on two pillows*  Supine with head raised 20 deg, completed various repeated movement strategies: Repeated cervical retraction, supine; 1 x 10  -reproduction of bilateral numbness, mildly remaining after  -minor numbness remaining after rest period  Repeated cervical flexion; 1 x 10  -numbness LUE>RUE   -after rest period 1 min, numbness remaining in L arm  Repeated cervical sidebend to L; 1 x 10   -numbness remaining in LUE  Repeated cervical sidebend to L, in supine; 2 x 10   -numbness further in L upper limb  Repeated cervical flexion; 1 x 10  -increase in L>RUE numbness   Scapular retractions; 1 x 10 and 1x6, 3 sec hold  -decreased reps on second set due to report of L UT pain Median nerve flossing; 1x10 on either side   -heavy verbal cueing and demonstration for technique, limited elbow/wrist extension exhibited by pt   PATIENT EDUCATION:  Discussed repeated motion principles and holding on repeated movement that consistently peripheralizes symptoms with more peripheral symptoms remaining after. Discussed holding on repeated motion and utilizing self-traction, neuromobilization, and simple scapular retraction isometrics for HEP at this time. HEP updated and new printout given to patient.    Manual Therapy - for symptom modulation, soft tissue sensitivity and mobility, joint mobility, ROM   Cervical traction with pt sidelying L; 10 sec intervals; 2 bouts x 3 minutes  Bilateral cervical sideglides, C3-5 gr III for improved mobility; x30 sec on each side   -increase in numbess in L>R arm with R to L sideglide    PA mobilization in supine, using PT's 1st MCPs on each articular pillar   -numbness bilaterally increased with  CPA at C4-6      PATIENT EDUCATION:  Education details: see above for patient education details Person educated: Patient Education method: Explanation, Demonstration, and Handouts Education comprehension: verbalized understanding and returned demonstration   HOME EXERCISE PROGRAM:  Access Code: [email or web address] URL: [email or web address] Date: 11/30/2023 Prepared by: Denese Finn  Exercises - Seated Self Cervical Traction  - 2 x daily - 7 x weekly - 2 sets - 10 reps - Median Nerve Flossing - Tray  - 2 x daily - 7 x weekly - 2 sets - 10 reps - 1sec hold - Seated Scapular Retraction  - 2 x daily - 7 x weekly - 2-3 sets - 5-8 reps - 3sec hold   ASSESSMENT:  CLINICAL IMPRESSION: Patient's radiculopathy does apparently respond to mechanical therapy/movement strategies, but he has increase in L>R arm numbness with various directions and has frequent re-occurrence of numbness with manual therapy techniques. Pt has significant bilateral paresthesias to forearm level with CPA from C4-6 levels. We modified program today to hold on repeated retraction and utilize self-traction, neuromobilization, and  simple periscapular isometrics as tolerated with modified rep scheme prn for improved tolerance. We will re-visit repeated motion strategies with future visits as tolerated. Pt is awaiting ESI next Wednesday 12/07/23. Pt has current deficits in C-spine AROM, mid to lower cervical spine stiffness/hypomobility, decreased tolerance to loading bilat deltoid/RTC, and postural changes/upper crossed presentation. Pt will continue to benefit from skilled PT services to address deficits and improve function.   OBJECTIVE IMPAIRMENTS: decreased ROM, decreased strength, hypomobility, impaired flexibility, impaired UE functional use, postural dysfunction, and pain.   ACTIVITY LIMITATIONS: carrying, lifting, bending, sleeping, transfers, bed mobility, and reach over head  PARTICIPATION LIMITATIONS: meal prep, driving, shopping, community activity, golfing, and throwing baseball with son/grandson  PERSONAL FACTORS: Age, Past/current experiences, Time since onset of injury/illness/exacerbation, and 3+ comorbidities: (OA, HLD, pre-diabetes, chronic neck pain with acute flare-up)  are also affecting patient's functional outcome.   REHAB POTENTIAL: Good  CLINICAL DECISION MAKING: Unstable/unpredictable  EVALUATION COMPLEXITY: High   GOALS: Goals reviewed with patient? Yes  SHORT TERM GOALS: Target date: 12/19/2023  Pt will be independent with HEP to improve strength and decrease neck pain to improve pain-free function at home and work. Baseline: 11/28/23: Baseline HEP initiated.  Goal status: INITIAL   LONG TERM GOALS: Target date: 01/12/2024  Pt will have cervical spine flexion/extension AROM to 30-35 deg or greater and C-spine rotation AROM to 60 deg or greater bilaterally as needed for completion of ADLs without functional limitation Baseline: 11/28/23:  Goal status: INITIAL  2.  Pt will decrease worst neck pain by at least 2 points on the NPRS in order to demonstrate clinically significant reduction in  neck pain. Baseline: 11/28/23: 10/10 at worst.  Goal status: INITIAL  3.  Pt will decrease NDI score by at least 19% in order demonstrate clinically significant reduction in neck pain/disability.       Baseline: 11/28/23: 10/50 = 20% Goal status: INITIAL  4.  Pt will demonstrate golf swing and overhead throw with normal mechanics and no reproduction of upper quarter pain as needed for participation in golf/throwing baseball with family. Baseline: 11/28/23: Pt currently unable to swing golf club or throw.  Goal status: INITIAL   PLAN: PT FREQUENCY: 1-2x/week  PT DURATION: 6 weeks  PLANNED INTERVENTIONS: Therapeutic exercises, Therapeutic activity, Neuromuscular re-education, Balance training, Gait training, Patient/Family education, Self Care, Joint mobilization, Joint manipulation, Vestibular training, Canalith repositioning, Orthotic/Fit training, DME instructions, Dry Needling, Electrical stimulation,  Spinal manipulation, Spinal mobilization, Cryotherapy, Moist heat, Taping, Traction, Ultrasound, Ionotophoresis 4mg /ml Dexamethasone, Manual therapy, and Re-evaluation.  PLAN FOR NEXT SESSION: Test traction in sidelying or seated position for potential relief of neuralgias. Utilize manual therapy/mobilization techniques for C-spine mobility and progress with postural re-education drills as tolerated. Continue with neuromobilization techniques as tolerated.    Denese Finn, PT, DPT #[id number]  Aleatha Hunting, PT 12/05/2023, 1:32 PM

## 2023-12-07 ENCOUNTER — Ambulatory Visit
Attending: Student in an Organized Health Care Education/Training Program | Admitting: Student in an Organized Health Care Education/Training Program

## 2023-12-07 ENCOUNTER — Ambulatory Visit
Admission: RE | Admit: 2023-12-07 | Discharge: 2023-12-07 | Disposition: A | Discharge status: Home / Self Care | Source: Ambulatory Visit | Attending: Student in an Organized Health Care Education/Training Program | Admitting: Student in an Organized Health Care Education/Training Program

## 2023-12-07 ENCOUNTER — Encounter: Payer: Self-pay | Admitting: Student in an Organized Health Care Education/Training Program

## 2023-12-07 VITALS — BP 139/76 | HR 75 | Temp 98.1°F | Resp 18 | Ht 70.0 in | Wt 181.0 lb

## 2023-12-07 DIAGNOSIS — G894 Chronic pain syndrome: Secondary | ICD-10-CM | POA: Insufficient documentation

## 2023-12-07 DIAGNOSIS — M5412 Radiculopathy, cervical region: Secondary | ICD-10-CM | POA: Insufficient documentation

## 2023-12-07 MED ORDER — SODIUM CHLORIDE 0.9% FLUSH
1.0000 mL | Freq: Once | INTRAVENOUS | Status: AC
  Administered 2023-12-07: 10 mL

## 2023-12-07 MED ORDER — IOHEXOL 180 MG/ML  SOLN
10.0000 mL | Freq: Once | INTRAMUSCULAR | Status: AC
  Administered 2023-12-07: 10 mL via EPIDURAL
  Filled 2023-12-07: qty 20

## 2023-12-07 MED ORDER — LIDOCAINE HCL (PF) 2 % IJ SOLN
INTRAMUSCULAR | Status: AC
Start: 2023-12-07 — End: ?
  Filled 2023-12-07: qty 5

## 2023-12-07 MED ORDER — ROPIVACAINE HCL 2 MG/ML IJ SOLN
1.0000 mL | Freq: Once | INTRAMUSCULAR | Status: AC
  Administered 2023-12-07: 20 mL via EPIDURAL
  Filled 2023-12-07: qty 20

## 2023-12-07 MED ORDER — SODIUM CHLORIDE (PF) 0.9 % IJ SOLN
INTRAMUSCULAR | Status: AC
  Filled 2023-12-07: qty 10

## 2023-12-07 MED ORDER — DEXAMETHASONE SODIUM PHOSPHATE 10 MG/ML IJ SOLN
10.0000 mg | Freq: Once | INTRAMUSCULAR | Status: AC
  Administered 2023-12-07: 10 mg
  Filled 2023-12-07: qty 1

## 2023-12-07 NOTE — Patient Instructions (Addendum)

## 2023-12-07 NOTE — Progress Notes (Signed)
 Safety precautions to be maintained throughout the outpatient stay will include: orient to surroundings, keep bed in low position, maintain call bell within reach at all times, provide assistance with transfer out of bed and ambulation.

## 2023-12-07 NOTE — Progress Notes (Signed)
 PROVIDER NOTE: Interpretation of information contained herein should be left to medically-trained personnel. Specific patient instructions are provided elsewhere under "Patient Instructions" section of medical record. This document was created in part using STT-dictation technology, any transcriptional errors that may result from this process are unintentional.  Patient: Jeff Mcmahon Type: Established DOB: May 03, 1946 MRN: [id number] PCP: Clarise Crooks, MD  Service: Procedure DOS: 12/07/2023 Setting: Ambulatory Location: Ambulatory outpatient facility Delivery: Face-to-face Provider: Cephus Collin, MD Specialty: Interventional Pain Management Specialty designation: 09 Location: Outpatient facility Ref. Prov.: Clarise Crooks, MD       Interventional Therapy   Procedure: Cervical Epidural Steroid injection (CESI) (Interlaminar) #1  Laterality: Midline  Level: C7-T1 Imaging: Fluoroscopy-assisted DOS: 12/07/2023  Performed by: Cephus Collin, MD Anesthesia: Local anesthesia (1-2% Lidocaine )   Purpose: Diagnostic/Therapeutic Indications: Cervicalgia, cervical radicular pain, degenerative disc disease, severe enough to impact quality of life or function. 1. Cervical radicular pain   2. Chronic pain syndrome    NAS-11 score:   Pre-procedure: 5 /10   Post-procedure: 5 /10      Position  Prep  Materials:  Location setting: Procedure suite Position: Prone, on modified reverse trendelenburg to facilitate breathing, with head in head-cradle. Pillows positioned under chest (below chin-level) with cervical spine flexed. Safety Precautions: Patient was assessed for positional comfort and pressure points before starting the procedure. Prepping solution: DuraPrep (Iodine Povacrylex [0.7% available iodine] and Isopropyl Alcohol, 74% w/w) Prep Area: Entire  cervicothoracic region Approach: percutaneous, paramedial Intended target: Posterior cervical epidural space Materials Procedure:   Tray: Epidural Needle(s): Epidural (Tuohy) Qty: 1 Length: (90mm) 3.5-inch Gauge: 22G  H&P (Pre-op Assessment):  Jeff Mcmahon is a 78 y.o. (year old), male patient, seen today for interventional treatment. He  has a past surgical history that includes Mouth surgery; Colonoscopy; Esophagogastroduodenoscopy; Excision mass neck (N/A, 09/07/2016); Excision of back lesion (N/A, 09/07/2016); Colonoscopy with propofol  (N/A, 06/02/2020); Esophagogastroduodenoscopy (egd) with propofol  (N/A, 09/02/2020); and Retinal laser procedure (Right, 04/06/2021). Jeff Mcmahon has a current medication list which includes the following prescription(s): acetaminophen , atorvastatin , famotidine , muscle rub, multivitamin with minerals, psyllium, sodium chloride , and triamcinolone . His primarily concern today is the Neck Pain  Initial Vital Signs:  Pulse/HCG Rate: 75ECG Heart Rate: 70 Temp: 98.1 F (36.7 C) Resp: 16 BP: 108/84 SpO2: 100 %  BMI: Estimated body mass index is 25.97 kg/m as calculated from the following:   Height as of this encounter: 5\' 10"  (1.778 m).   Weight as of this encounter: 181 lb (82.1 kg).  Risk Assessment: Allergies: Reviewed. He is allergic to voltaren [diclofenac].  Allergy Precautions: None required Coagulopathies: Reviewed. None identified.  Blood-thinner therapy: None at this time Active Infection(s): Reviewed. None identified. Jeff Mcmahon is afebrile  Site Confirmation: Jeff Mcmahon was asked to confirm the procedure and laterality before marking the site Procedure checklist: Completed Consent: Before the procedure and under the influence of no sedative(s), amnesic(s), or anxiolytics, the patient was informed of the treatment options, risks and possible complications. To fulfill our ethical and legal obligations, as recommended by the American Medical Association's Code of Ethics, I have informed the patient of my clinical impression; the nature and purpose of the treatment or  procedure; the risks, benefits, and possible complications of the intervention; the alternatives, including doing nothing; the risk(s) and benefit(s) of the alternative treatment(s) or procedure(s); and the risk(s) and benefit(s) of doing nothing. The patient was provided information about the general risks and possible complications associated with the procedure. These may include, but are  not limited to: failure to achieve desired goals, infection, bleeding, organ or nerve damage, allergic reactions, paralysis, and death. In addition, the patient was informed of those risks and complications associated to Spine-related procedures, such as failure to decrease pain; infection (i.e.: Meningitis, epidural or intraspinal abscess); bleeding (i.e.: epidural hematoma, subarachnoid hemorrhage, or any other type of intraspinal or peri-dural bleeding); organ or nerve damage (i.e.: Any type of peripheral nerve, nerve root, or spinal cord injury) with subsequent damage to sensory, motor, and/or autonomic systems, resulting in permanent pain, numbness, and/or weakness of one or several areas of the body; allergic reactions; (i.e.: anaphylactic reaction); and/or death. Furthermore, the patient was informed of those risks and complications associated with the medications. These include, but are not limited to: allergic reactions (i.e.: anaphylactic or anaphylactoid reaction(s)); adrenal axis suppression; blood sugar elevation that in diabetics may result in ketoacidosis or comma; water retention that in patients with history of congestive heart failure may result in shortness of breath, pulmonary edema, and decompensation with resultant heart failure; weight gain; swelling or edema; medication-induced neural toxicity; particulate matter embolism and blood vessel occlusion with resultant organ, and/or nervous system infarction; and/or aseptic necrosis of one or more joints. Finally, the patient was informed that Medicine is  not an exact science; therefore, there is also the possibility of unforeseen or unpredictable risks and/or possible complications that may result in a catastrophic outcome. The patient indicated having understood very clearly. We have given the patient no guarantees and we have made no promises. Enough time was given to the patient to ask questions, all of which were answered to the patient's satisfaction. Mr. Kubick has indicated that he wanted to continue with the procedure. Attestation: I, the ordering provider, attest that I have discussed with the patient the benefits, risks, side-effects, alternatives, likelihood of achieving goals, and potential problems during recovery for the procedure that I have provided informed consent. Date  Time: 12/07/2023  9:56 AM  Pre-Procedure Preparation:  Monitoring: As per clinic protocol. Respiration, ETCO2, SpO2, BP, heart rate and rhythm monitor placed and checked for adequate function Safety Precautions: Patient was assessed for positional comfort and pressure points before starting the procedure. Time-out: I initiated and conducted the "Time-out" before starting the procedure, as per protocol. The patient was asked to participate by confirming the accuracy of the "Time Out" information. Verification of the correct person, site, and procedure were performed and confirmed by me, the nursing staff, and the patient. "Time-out" conducted as per Joint Commission's Universal Protocol (UP.01.01.01). Time: 1101 Start Time: 1101 hrs.  Description  Narrative of Procedure:          Rationale (medical necessity): procedure needed and proper for the diagnosis and/or treatment of the patient's medical symptoms and needs. Start Time: 1101 hrs. Safety Precautions: Aspiration looking for blood return was conducted prior to all injections. At no point did we inject any substances, as a needle was being advanced. No attempts were made at seeking any paresthesias. Safe  injection practices and needle disposal techniques used. Medications properly checked for expiration dates. SDV (single dose vial) medications used. Description of procedure: Protocol guidelines were followed. The patient was assisted into a comfortable position. The target area was identified and the area prepped in the usual manner. Skin & deeper tissues infiltrated with local anesthetic. Appropriate amount of time allowed to pass for local anesthetics to take effect. Using fluoroscopic guidance, the epidural needle was introduced through the skin, ipsilateral to the reported pain, and advanced to  the target area. Posterior laminar os was contacted and the needle walked caudad, until the lamina was cleared. The ligamentum flavum was engaged and the epidural space identified using "loss-of-resistance technique" with 2-3 ml of PF-NaCl (0.9% NSS), in a 5cc dedicated LOR syringe. (See "Imaging guidance" below for use of contrast details.) Once proper needle placement was secured, and negative aspiration confirmed, the solution was injected in intermittent fashion, asking for systemic symptoms every 0.5cc. The needles were then removed and the area cleansed, making sure to leave some of the prepping solution back to take advantage of its long term bactericidal properties.  Vitals:   12/07/23 1009 12/07/23 1059 12/07/23 1105  BP: 108/84 132/76 139/76  Pulse: 75    Resp: 16 18 18   Temp: 98.1 F (36.7 C)    SpO2: 100% 99% 99%  Weight: 181 lb (82.1 kg)    Height: 5\' 10"  (1.778 m)       End Time: 1105 hrs.  Imaging Guidance (Spinal):          Type of Imaging Technique: Fluoroscopy Guidance (Spinal) Indication(s): Fluoroscopy guidance for needle placement to enhance accuracy in procedures requiring precise needle localization for targeted delivery of medication in or near specific anatomical locations not easily accessible without such real-time imaging assistance. Exposure Time: Please see nurses  notes. Contrast: Before injecting any contrast, we confirmed that the patient did not have an allergy to iodine, shellfish, or radiological contrast. Once satisfactory needle placement was completed at the desired level, radiological contrast was injected. Contrast injected under live fluoroscopy. No contrast complications. See chart for type and volume of contrast used. Fluoroscopic Guidance: I was personally present during the use of fluoroscopy. "Tunnel Vision Technique" used to obtain the best possible view of the target area. Parallax error corrected before commencing the procedure. "Direction-depth-direction" technique used to introduce the needle under continuous pulsed fluoroscopy. Once target was reached, antero-posterior, oblique, and lateral fluoroscopic projection used confirm needle placement in all planes. Images permanently stored in EMR. Interpretation: I personally interpreted the imaging intraoperatively. Adequate needle placement confirmed in multiple planes. Appropriate spread of contrast into desired area was observed. No evidence of afferent or efferent intravascular uptake. No intrathecal or subarachnoid spread observed. Permanent images saved into the patient's record.  Post-operative Assessment:  Post-procedure Vital Signs:  Pulse/HCG Rate: 7573 Temp: 98.1 F (36.7 C) Resp: 18 BP: 139/76 SpO2: 99 %  EBL: None  Complications: No immediate post-treatment complications observed by team, or reported by patient.  Note: The patient tolerated the entire procedure well. A repeat set of vitals were taken after the procedure and the patient was kept under observation following institutional policy, for this type of procedure. Post-procedural neurological assessment was performed, showing return to baseline, prior to discharge. The patient was provided with post-procedure discharge instructions, including a section on how to identify potential problems. Should any problems arise  concerning this procedure, the patient was given instructions to immediately contact us , at any time, without hesitation. In any case, we plan to contact the patient by telephone for a follow-up status report regarding this interventional procedure.  Comments:  No additional relevant information.  Plan of Care (POC)  Orders:  Orders Placed This Encounter  Procedures   DG PAIN CLIN[facility] C-ARM 1-60 MIN NO REPORT    Intraoperative interpretation by procedural physician at [facility].    Standing Status:   Standing    Number of Occurrences:   1    Reason for exam::  Assistance in needle guidance and placement for procedures requiring needle placement in or near specific anatomical locations not easily accessible without such assistance.    Medications ordered for procedure: Meds ordered this encounter  Medications   iohexol (OMNIPAQUE) 180 MG/ML injection 10 mL    Must be Myelogram-compatible. If not available, you may substitute with a water-soluble, non-ionic, hypoallergenic, myelogram-compatible radiological contrast medium.   ropivacaine (PF) 2 mg/mL (0.2%) (NAROPIN) injection 1 mL   sodium chloride  flush (NS) 0.9 % injection 1 mL   dexamethasone (DECADRON) injection 10 mg   Medications administered: We administered iohexol, ropivacaine (PF) 2 mg/mL (0.2%), sodium chloride  flush, and dexamethasone.  See the medical record for exact dosing, route, and time of administration.  Follow-up plan:   Return in about 5 weeks (around 01/11/2024) for PPE, F2F.       C7/T1 Esi 12/07/23    Recent Visits Date Type Provider Dept  11/08/23 Office Visit Cephus Collin, MD [facility]  Showing recent visits within past 90 days and meeting all other requirements Today's Visits Date Type Provider Dept  12/07/23 Procedure visit Cephus Collin, MD [facility]  Showing today's visits and meeting all other requirements Future Appointments No visits were found meeting  these conditions. Showing future appointments within next 90 days and meeting all other requirements  Disposition: Discharge home  Discharge (Date  Time): 12/07/2023; 1115 hrs.   Primary Care Physician: Clarise Crooks, MD Location: [facility] Note by: Cephus Collin, MD (TTS technology used. I apologize for any typographical errors that were not detected and corrected.) Date: 12/07/2023; Time: 11:09 AM  Disclaimer:  Medicine is not an Visual merchandiser. The only guarantee in medicine is that nothing is guaranteed. It is important to note that the decision to proceed with this intervention was based on the information collected from the patient. The Data and conclusions were drawn from the patient's questionnaire, the interview, and the physical examination. Because the information was provided in large part by the patient, it cannot be guaranteed that it has not been purposely or unconsciously manipulated. Every effort has been made to obtain as much relevant data as possible for this evaluation. It is important to note that the conclusions that lead to this procedure are derived in large part from the available data. Always take into account that the treatment will also be dependent on availability of resources and existing treatment guidelines, considered by other Pain Management Practitioners as being common knowledge and practice, at the time of the intervention. For Medico-Legal purposes, it is also important to point out that variation in procedural techniques and pharmacological choices are the acceptable norm. The indications, contraindications, technique, and results of the above procedure should only be interpreted and judged by a Board-Certified Interventional Pain Specialist with extensive familiarity and expertise in the same exact procedure and technique.

## 2023-12-08 ENCOUNTER — Telehealth: Payer: Self-pay | Admitting: *Deleted

## 2023-12-08 NOTE — Telephone Encounter (Signed)
 No problems post procedure.

## 2023-12-12 ENCOUNTER — Encounter: Payer: Self-pay | Admitting: Physical Therapy

## 2023-12-12 ENCOUNTER — Ambulatory Visit: Attending: Student in an Organized Health Care Education/Training Program | Admitting: Physical Therapy

## 2023-12-12 DIAGNOSIS — M6281 Muscle weakness (generalized): Secondary | ICD-10-CM | POA: Insufficient documentation

## 2023-12-12 DIAGNOSIS — M79601 Pain in right arm: Secondary | ICD-10-CM | POA: Insufficient documentation

## 2023-12-12 DIAGNOSIS — M542 Cervicalgia: Secondary | ICD-10-CM | POA: Diagnosis not present

## 2023-12-12 NOTE — Therapy (Signed)
 OUTPATIENT PHYSICAL THERAPY NECK TREATMENT   Patient Name: Jeff Mcmahon MRN: [id number] DOB:1945/12/26, 78 y.o., male Today's Date: 12/12/2023  END OF SESSION:  PT End of Session - 12/12/23 1346     Visit Number 4    Number of Visits 13    Date for PT Re-Evaluation 01/12/24    Authorization Type Medicare A&B 2025    PT Start Time 1332    PT Stop Time 1415    PT Time Calculation (min) 43 min    Activity Tolerance Patient tolerated treatment well    Behavior During Therapy WFL for tasks assessed/performed             Past Medical History:  Diagnosis Date   Arthritis    Dysrhythmia 2007   H/O IRREGULAR HEART BEAT-SAW CARDIOLOGIST AND SAID IT WAS NOTHING TO BE CONCERNED WITH   GERD (gastroesophageal reflux disease)    H/O   Hyperlipidemia    Pre-diabetes    Past Surgical History:  Procedure Laterality Date   COLONOSCOPY     COLONOSCOPY WITH PROPOFOL  N/A 06/02/2020   Procedure: COLONOSCOPY WITH PROPOFOL ;  Surgeon: Selena Daily, MD;  Location: [facility];  Service: Gastroenterology;  Laterality: N/A;   ESOPHAGOGASTRODUODENOSCOPY     ESOPHAGOGASTRODUODENOSCOPY (EGD) WITH PROPOFOL  N/A 09/02/2020   Procedure: ESOPHAGOGASTRODUODENOSCOPY (EGD) WITH PROPOFOL ;  Surgeon: Selena Daily, MD;  Location: [facility];  Service: Gastroenterology;  Laterality: N/A;   EXCISION MASS NECK N/A 09/07/2016   Procedure: EXCISION MASS NECK;  Surgeon: Claudia Cuff, MD;  Location: [facility];  Service: General;  Laterality: N/A;   EXCISION OF BACK LESION N/A 09/07/2016   Procedure: EXCISION OF BACK LESION;  Surgeon: Claudia Cuff, MD;  Location: [facility] OR[facility];  Service: General;  Laterality: N/A;   MOUTH SURGERY     dental procedure   RETINAL LASER PROCEDURE Right 04/06/2021   [facility] in [place]   Patient Active Problem List   Diagnosis Date Noted   Cervical radicular pain 11/08/2023   Chronic pain syndrome 11/08/2023   Retinal tear of right eye  12/21/2021   Dysphagia    Cervical spondylosis 10/02/2015   Prediabetes 09/09/2015   History of colonic polyps 09/05/2015   Hyperlipidemia 09/04/2015   GERD (gastroesophageal reflux disease) 09/04/2015   Dry eyes 09/04/2015    PCP: Clarise Crooks, MD  REFERRING PROVIDER: Cephus Collin, MD  REFERRING DIAG:  M54.12 (ICD-10-CM) - Cervical radicular pain  M47.812 (ICD-10-CM) - Cervical spondylosis  G89.4 (ICD-10-CM) - Chronic pain syndrome  M54.2 (ICD-10-CM) - Cervicalgia    RATIONALE FOR EVALUATION AND TREATMENT: Rehabilitation  THERAPY DIAG: Cervicalgia  Pain in right arm  Muscle weakness (generalized)  ONSET DATE: Worsening pain since Feb 2024, chronic issue prior to that which was under control  FOLLOW-UP APPT SCHEDULED WITH REFERRING PROVIDER: Yes ; 12/07/23   PERTINENT HISTORY:  Pt is a 78 year old male referred for R-sided neck pain with R upper limb referral with atraumatic onset. Med hx including OA, GERD, HLD, pre-diabetes. MRI results have yet to be obtained from radiologist. Hx of C-spine X-rays with evidence of moderate degenerative changes and loss of lordosis.   Pt reports long-term problem that has gotten worse over last couple of months. He reports minor pain and motion deficits for approx 40 years. In Nov 2023, pt got a lot more stiffness in C-spine; R-sided neck pain primarily. Symptoms improved with Rx medication given by Dr. Augustus Ledger in the fall. Pt reports in February, symptoms got much worse,  up to 10/10. Prednisone  resolved pain completely, but pain came back in a couple of days. Pt reports notable pain with turning his head. Pt will consider ESI for 12/07/23. "Uneasiness" in neck at rest along posterior cervical spine. Symptoms are variable. Pt reports more issues with getting to sleep and with turning his head to change positions. Pt reports he can have posterior C-spine pain with wide jaw opening. No recent nausea, vomiting, vertigo, dizziness, visual changes.     Pain:  Pain Intensity: Present: 2/10, Best: 1/10, Worst: 10/10 Pain location: Posterior C-spine and occipital region Pain Quality: sharp  Radiating: No  Numbness/Tingling: Yes; numbness in arms with cervical extension, down to bilat forearms  Focal Weakness: No Aggravating factors: turning R more than L, lying flat in bed, turning over in bed, looking overhead Relieving factors: keeping head in pain-free range, sitting with good head support, limiting ROM, heat, Bengay 24-hour pain behavior: worse at night time   History of prior neck injury, pain, surgery, or therapy: Yes;  Hx of RTC-related pain in both shoulders Dominant hand: left  Imaging: Yes ; *Cervical spine X-rays 1. Moderate degenerative changes. No evidence for acute abnormality. 2. Loss of cervical lordosis.  *Cervical spine MRI completed 11/11/23 - awaiting release of radiologist report   Red flags (personal history of cancer, h/o spinal tumors, history of compression fracture, chills/fever, night sweats, nausea, vomiting, unrelenting pain): Negative   PRECAUTIONS: None  WEIGHT BEARING RESTRICTIONS: No  FALLS: Has patient fallen in last 6 months? No  Living Environment Lives with: lives with their spouse; son lives 20 minutes away  Lives in: House/apartment   Prior level of function: Independent  Occupational demands: Retired  Hobbies: Multimedia programmer, able to throw baseball with son/grandkids  Patient Goals: Able to resolve pain     OBJECTIVE (data from initial evaluation unless otherwise dated):    Patient Surveys  NDI 10/50 = 20%  Posture Increased thoracic kyphosis, Mild Dowager's hump, forward head. Resting in posterior pelvic tilt/kyphotic posture in sitting.   AROM AROM (Normal range in degrees) AROM 11/28/23  Cervical  Flexion (50) 25  Extension (80) 13* (Muscles "grab" back of neck)  Right lateral flexion (45) 15*  Left lateral flexion (45) 16*  Right rotation (85) 42  Left rotation  (85) 39  (* = pain; Blank rows = not tested)   MMT MMT (out of 5) Right 11/28/23 Left 11/28/23  Cervical (isometric)  Flexion WNL (numbness reproduced bilat forearms)  Extension WNL  Lateral Flexion WNL WNL  Rotation WNL*(numbness reproduced bilat forearms) WNL*      Shoulder   Flexion 4+ 4+  Extension    Abduction 4+ 4+* ( L shoulder pain)  Internal rotation 4+ 4+  External rotation  4+ 4+  Horizontal abduction    Horizontal adduction    Lower Trapezius    Rhomboids        Elbow  Flexion 5 5  Extension 5 5  Pronation    Supination        Wrist  Flexion 4+ 4+  Extension 5 5  Radial deviation    Ulnar deviation        (* = pain; Blank rows = not tested)  Sensation Grossly intact to light touch bilateral UE as determined by testing dermatomes C2-T2. Proprioception and hot/cold testing deferred on this date.  Reflexes R/L Elbow: 1+/1+  Brachioradialis: 2+/2+  Tricep: 2+/2+ UMN signs: Negative (Hoffman's and Clonus)  Palpation Location LEFT  RIGHT  Suboccipitals 1 1  Cervical paraspinals 1 1  Upper Trapezius 1 (n/t reproduced) 1 (n/t reproduced)  Levator Scapulae    Rhomboid Major/Minor    (Blank rows = not tested) Graded on 0-4 scale (0 = no pain, 1 = pain, 2 = pain with wincing/grimacing/flinching, 3 = pain with withdrawal, 4 = unwilling to allow palpation), (Blank rows = not tested)  Repeated Movements Repeated retraction: Mild pressure in back of head, no worse after  Passive Accessory Intervertebral Motion Significant hypomobility C3-C6, decreased sideglide bilaterally in mid to lower C-spine with pain at restriction   SPECIAL TESTS Spurlings A (ipsilateral lateral flexion/axial compression): R: Positive for neck pain L: Positive for neck pain Spurlings B (ipsilateral lateral flexion/contralateral rotation/axial compression): R: Negative L: Negative Cervical compression: Reproduction of N/T Distraction Test: No pain, residual numbness that  is not resolved  Hoffman Sign (cervical cord compression): R: Negative L: Negative ULTT Median: R: Negative L: Negative ULTT Ulnar: R: Positive for neck pain L: Negative ULTT Radial: R: Negative L: Negative   TODAY'S TREATMENT    SUBJECTIVE STATEMENT:   Patent got results from radiologist's results for C-spine MRI. Patient reports notable numbness this AM with performance of L upper limb median nerve flossing. Pt reports some pain along bilateral lateral paracervical musculature with self-traction. Patient reports overall less pain since last Wednesday. Patient reports some residual numbness in L upper limb with nerve gliding exercise earlier.     Manual Therapy - for symptom modulation, soft tissue sensitivity and mobility, joint mobility, ROM   Cervical traction with pt sidelying L; 10 sec intervals; 2 bouts x 5 minutes  Bilateral cervical sideglides, C3-5 gr III for improved mobility; x30 sec on each side   -increase in numbess in L>R arm with R to L sideglide    STM/DTM bilateral C4-6 splenius cervicis x 10 minutes   Contact-relax technique for R and L rotation; antagonist contraction; x 5 for either direction   -Cervical spine rotation: R 55, L 50    *not today*  PA mobilization in supine, using PT's 1st MCPs on each articular pillar   -numbness bilaterally increased with CPA at C4-6 Manual median nerve glides with maintenance of scapular depression, LUE; patient performing simultaneous cervical lateral flexion toward side of symptoms with extended position of wrist/elbow  X 20 repetitions    Therapeutic Exercise - for improved soft tissue flexibility and extensibility as needed for ROM, improved strength as needed to improve performance of CKC activities/functional movements   Median nerve flossing; 1x10 on either side   -heavy verbal cueing and demonstration for technique, limited elbow/wrist extension exhibited by pt   -tactile cueing and manual guidance from  PT  PATIENT EDUCATION: Discussed technique with nerve flossing and use of traction technique at home for offloading irritated nerve roots.     *not today* Repeated cervical retraction, supine; 1 x 10  -reproduction of bilateral numbness, mildly remaining after  -minor numbness remaining after rest period  Repeated cervical flexion; 1 x 10  -numbness LUE>RUE   -after rest period 1 min, numbness remaining in L arm  Repeated cervical sidebend to L; 1 x 10 Scapular retractions; 1 x 10 and 1x6, 3 sec hold  -decreased reps on second set due to report of L UT pain      PATIENT EDUCATION:  Education details: see above for patient education details Person educated: Patient Education method: Explanation, Demonstration, and Handouts Education comprehension: verbalized understanding and returned demonstration   HOME EXERCISE PROGRAM:  Access Code: [email or web address] URL: [email or web address] Date: 12/12/2023 Prepared by: Denese Finn  Exercises - Seated Self Cervical Traction  - 2 x daily - 7 x weekly - 2 sets - 10 reps - 5-10sec hold - Median Nerve Flossing - Tray  - 2 x daily - 7 x weekly - 2 sets - 10 reps - 1sec hold - Seated Scapular Retraction  - 2 x daily - 7 x weekly - 2-3 sets - 5-8 reps - 3sec hold   ASSESSMENT:  CLINICAL IMPRESSION: Patient does seem to have improved neck pain at baseline following ESI. He does have some L>R upper limb paresthesias and numbness that still occur with ROM and intermittently with lying supine. His ROM has improved, and we are able to achieve improvement in-session with C-spine rotation with pt being just shy of 60 deg functional range for R and L cervical rotation. Pt has one more visit prior to traveling out of the country for two weeks. His HEP will need to be updated for mobility work to continue while he is out of the country. Pt has current deficits in C-spine AROM, mid to lower cervical spine stiffness/hypomobility, decreased  tolerance to loading bilat deltoid/RTC, and postural changes/upper crossed presentation. Pt will continue to benefit from skilled PT services to address deficits and improve function.   OBJECTIVE IMPAIRMENTS: decreased ROM, decreased strength, hypomobility, impaired flexibility, impaired UE functional use, postural dysfunction, and pain.   ACTIVITY LIMITATIONS: carrying, lifting, bending, sleeping, transfers, bed mobility, and reach over head  PARTICIPATION LIMITATIONS: meal prep, driving, shopping, community activity, golfing, and throwing baseball with son/grandson  PERSONAL FACTORS: Age, Past/current experiences, Time since onset of injury/illness/exacerbation, and 3+ comorbidities: (OA, HLD, pre-diabetes, chronic neck pain with acute flare-up)  are also affecting patient's functional outcome.   REHAB POTENTIAL: Good  CLINICAL DECISION MAKING: Unstable/unpredictable  EVALUATION COMPLEXITY: High   GOALS: Goals reviewed with patient? Yes  SHORT TERM GOALS: Target date: 12/19/2023  Pt will be independent with HEP to improve strength and decrease neck pain to improve pain-free function at home and work. Baseline: 11/28/23: Baseline HEP initiated.  Goal status: INITIAL   LONG TERM GOALS: Target date: 01/12/2024  Pt will have cervical spine flexion/extension AROM to 30-35 deg or greater and C-spine rotation AROM to 60 deg or greater bilaterally as needed for completion of ADLs without functional limitation Baseline: 11/28/23:  Goal status: INITIAL  2.  Pt will decrease worst neck pain by at least 2 points on the NPRS in order to demonstrate clinically significant reduction in neck pain. Baseline: 11/28/23: 10/10 at worst.  Goal status: INITIAL  3.  Pt will decrease NDI score by at least 19% in order demonstrate clinically significant reduction in neck pain/disability.       Baseline: 11/28/23: 10/50 = 20% Goal status: INITIAL  4.  Pt will demonstrate golf swing and overhead throw with  normal mechanics and no reproduction of upper quarter pain as needed for participation in golf/throwing baseball with family. Baseline: 11/28/23: Pt currently unable to swing golf club or throw.  Goal status: INITIAL   PLAN: PT FREQUENCY: 1-2x/week  PT DURATION: 6 weeks  PLANNED INTERVENTIONS: Therapeutic exercises, Therapeutic activity, Neuromuscular re-education, Balance training, Gait training, Patient/Family education, Self Care, Joint mobilization, Joint manipulation, Vestibular training, Canalith repositioning, Orthotic/Fit training, DME instructions, Dry Needling, Electrical stimulation, Spinal manipulation, Spinal mobilization, Cryotherapy, Moist heat, Taping, Traction, Ultrasound, Ionotophoresis 4mg /ml Dexamethasone , Manual therapy, and Re-evaluation.  PLAN FOR NEXT SESSION: Traction in sidelying or consider seated as  needed for symptom modulation. Utilize manual therapy/mobilization techniques for C-spine mobility and progress with postural re-education drills as tolerated. Continue with neuromobilization techniques as tolerated.   Denese Finn, PT, DPT #[id number]  Aleatha Hunting, PT 12/12/2023, 4:38 PM

## 2023-12-13 ENCOUNTER — Ambulatory Visit (INDEPENDENT_AMBULATORY_CARE_PROVIDER_SITE_OTHER): Payer: Self-pay | Admitting: Family Medicine

## 2023-12-13 ENCOUNTER — Encounter: Payer: Self-pay | Admitting: Family Medicine

## 2023-12-13 VITALS — BP 128/76 | HR 73 | Ht 70.0 in | Wt 185.0 lb

## 2023-12-13 DIAGNOSIS — E785 Hyperlipidemia, unspecified: Secondary | ICD-10-CM

## 2023-12-13 MED ORDER — EZETIMIBE 10 MG PO TABS
10.0000 mg | ORAL_TABLET | Freq: Every day | ORAL | 1 refills | Status: DC
Start: 2023-12-13 — End: 2024-07-03

## 2023-12-13 NOTE — Progress Notes (Signed)
 Date:  12/13/2023   Name:  Jeff Mcmahon   DOB:  10-12-1945   MRN:  [id number]   Chief Complaint: Medical Management of Chronic Issues (Patient presents today for a overall follow up on his health. He ha been taking his medications as directed and is doing well today. )  Hyperlipidemia This is a chronic problem. The current episode started more than 1 year ago. The problem is controlled. Recent lipid tests were reviewed and are normal. He has no history of chronic renal disease, diabetes or obesity. There are no known factors aggravating his hyperlipidemia. Associated symptoms include a focal sensory loss. Pertinent negatives include no chest pain, focal weakness, leg pain, myalgias or shortness of breath. Current antihyperlipidemic treatment includes statins. The current treatment provides moderate improvement of lipids. There are no compliance problems.  There are no known risk factors for coronary artery disease.    Lab Results  Component Value Date   NA 142 08/12/2022   K 4.4 08/12/2022   CO2 24 08/12/2022   GLUCOSE 94 08/12/2022   BUN 12 08/12/2022   CREATININE 0.87 08/12/2022   CALCIUM  9.2 08/12/2022   EGFR 90 08/12/2022   GFRNONAA 84 05/16/2019   Lab Results  Component Value Date   CHOL 199 06/21/2023   HDL 47 06/21/2023   LDLCALC 136 (H) 06/21/2023   TRIG 90 06/21/2023   CHOLHDL 3.9 12/31/2019   Lab Results  Component Value Date   TSH 2.430 07/22/2017   Lab Results  Component Value Date   HGBA1C 5.5 06/21/2023   Lab Results  Component Value Date   WBC 5.3 07/22/2017   HGB 16.0 07/22/2017   HCT 47.7 07/22/2017   MCV 96 07/22/2017   PLT 236 07/22/2017   Lab Results  Component Value Date   ALT 22 07/25/2018   AST 20 07/25/2018   ALKPHOS 103 07/25/2018   BILITOT 0.6 07/25/2018   No results found for: "25OHVITD2", "25OHVITD3", "VD25OH"   Review of Systems  Constitutional:  Negative for chills and fever.  HENT:  Negative for drooling, ear discharge, ear  pain and sore throat.   Respiratory:  Negative for cough, shortness of breath and wheezing.   Cardiovascular:  Negative for chest pain, palpitations and leg swelling.  Gastrointestinal:  Negative for abdominal pain, blood in stool, constipation, diarrhea and nausea.  Endocrine: Negative for polydipsia and polyuria.  Genitourinary:  Negative for dysuria, frequency, hematuria and urgency.  Musculoskeletal:  Negative for back pain, myalgias and neck pain.  Skin:  Negative for rash.  Allergic/Immunologic: Negative for environmental allergies.  Neurological:  Negative for dizziness, focal weakness and headaches.  Hematological:  Does not bruise/bleed easily.  Psychiatric/Behavioral:  Negative for suicidal ideas. The patient is not nervous/anxious.     Patient Active Problem List   Diagnosis Date Noted   Cervical radicular pain 11/08/2023   Chronic pain syndrome 11/08/2023   Retinal tear of right eye 12/21/2021   Dysphagia    Cervical spondylosis 10/02/2015   Prediabetes 09/09/2015   History of colonic polyps 09/05/2015   Hyperlipidemia 09/04/2015   GERD (gastroesophageal reflux disease) 09/04/2015   Dry eyes 09/04/2015    Allergies  Allergen Reactions   Voltaren [Diclofenac] Rash    Past Surgical History:  Procedure Laterality Date   COLONOSCOPY     COLONOSCOPY WITH PROPOFOL  N/A 06/02/2020   Procedure: COLONOSCOPY WITH PROPOFOL ;  Surgeon: Selena Daily, MD;  Location: [facility];  Service: Gastroenterology;  Laterality: N/A;   ESOPHAGOGASTRODUODENOSCOPY  ESOPHAGOGASTRODUODENOSCOPY (EGD) WITH PROPOFOL  N/A 09/02/2020   Procedure: ESOPHAGOGASTRODUODENOSCOPY (EGD) WITH PROPOFOL ;  Surgeon: Selena Daily, MD;  Location: [facility] END[facility]COPY;  Service: Gastroenterology;  Laterality: N/A;   EXCISION MASS NECK N/A 09/07/2016   Procedure: EXCISION MASS NECK;  Surgeon: Claudia Cuff, MD;  Location: [facility] ORS;  Service: General;  Laterality: N/A;   EXCISION OF BACK LESION N/A  09/07/2016   Procedure: EXCISION OF BACK LESION;  Surgeon: Claudia Cuff, MD;  Location: [facility] OR[facility];  Service: General;  Laterality: N/A;   MOUTH SURGERY     dental procedure   RETINAL LASER PROCEDURE Right 04/06/2021   [facility] in [place]    Social History   Tobacco Use   Smoking status: Never   Smokeless tobacco: Never   Tobacco comments:    smoking cessation materials not required  Vaping Use   Vaping status: Never Used  Substance Use Topics   Alcohol use: Yes    Comment: social   Drug use: No     Medication list has been reviewed and updated.  Current Meds  Medication Sig   acetaminophen  (TYLENOL ) 500 MG tablet Take 500 mg by mouth every 6 (six) hours as needed.   atorvastatin  (LIPITOR) 10 MG tablet Take 1 tablet (10 mg total) by mouth daily.   famotidine  (PEPCID ) 40 MG tablet Take 1 tablet (40 mg total) by mouth daily.   Menthol-Methyl Salicylate (MUSCLE RUB) 10-15 % CREA Apply 1 Application topically as needed for muscle pain.   Multiple Vitamin (MULTIVITAMIN WITH MINERALS) TABS tablet Take 1 tablet by mouth daily.   Propylene Glycol (SYSTANE COMPLETE) 0.6 % SOLN Apply to eye.   psyllium (METAMUCIL) 58.6 % powder Take 1 packet by mouth 3 (three) times daily.   sodium chloride  (OCEAN) 0.65 % SOLN nasal spray Place 1 spray into both nostrils as needed for congestion.    triamcinolone  (NASACORT ) 55 MCG/ACT AERO nasal inhaler Place 2 sprays into the nose daily. Dr. Tommye Franc       12/13/2023    8:02 AM 06/21/2023    7:58 AM 12/22/2022    7:50 AM 08/11/2022    9:36 AM  GAD 7 : Generalized Anxiety Score  Nervous, Anxious, on Edge 0 0 0 0  Control/stop worrying 0 0 0 0  Worry too much - different things 0 0 0 0  Trouble relaxing 0 0 0 0  Restless 0 0 0 0  Easily annoyed or irritable 0 0 0 0  Afraid - awful might happen 0 0 0 0  Total GAD 7 Score 0 0 0 0  Anxiety Difficulty Not difficult at all Not difficult at all Not difficult at all Not difficult  at all       12/13/2023    8:02 AM 12/07/2023   10:05 AM 11/08/2023   10:43 AM  Depression screen PHQ 2/9  Decreased Interest 0 0 0  Down, Depressed, Hopeless 0 0 0  PHQ - 2 Score 0 0 0  Altered sleeping 0    Tired, decreased energy 0    Change in appetite 0    Feeling bad or failure about yourself  0    Trouble concentrating 0    Moving slowly or fidgety/restless 0    Suicidal thoughts 0    PHQ-9 Score 0    Difficult doing work/chores Not difficult at all      BP Readings from Last 3 Encounters:  12/13/23 128/76  12/07/23 139/76  11/08/23 (!) 147/80  Physical Exam Vitals and nursing note reviewed.  Constitutional:      Appearance: He is well-developed.  HENT:     Head: Normocephalic and atraumatic.     Right Ear: Tympanic membrane, ear canal and external ear normal.     Left Ear: Tympanic membrane, ear canal and external ear normal.     Nose: Nose normal.     Mouth/Throat:     Mouth: Mucous membranes are moist.     Dentition: Normal dentition.  Eyes:     General: Lids are normal. No scleral icterus.    Conjunctiva/sclera: Conjunctivae normal.     Pupils: Pupils are equal, round, and reactive to light.  Neck:     Thyroid : No thyromegaly.     Vascular: No carotid bruit, hepatojugular reflux or JVD.     Trachea: No tracheal deviation.  Cardiovascular:     Rate and Rhythm: Normal rate and regular rhythm.     Heart sounds: Normal heart sounds. No murmur heard.    No friction rub. No gallop.  Pulmonary:     Effort: Pulmonary effort is normal.     Breath sounds: Normal breath sounds. No wheezing, rhonchi or rales.  Abdominal:     General: Bowel sounds are normal.     Palpations: Abdomen is soft. There is no hepatomegaly, splenomegaly or mass.     Tenderness: There is no abdominal tenderness.     Hernia: There is no hernia in the left inguinal area.  Genitourinary:    Prostate: Not enlarged, not tender and no nodules present.     Rectum: Normal. Guaiac result  negative. No mass.  Musculoskeletal:     Cervical back: Normal range of motion and neck supple.  Lymphadenopathy:     Cervical: No cervical adenopathy.  Skin:    General: Skin is warm and dry.  Neurological:     Mental Status: He is alert.     Deep Tendon Reflexes: Reflexes are normal and symmetric.  Psychiatric:        Mood and Affect: Mood is not anxious or depressed.     Wt Readings from Last 3 Encounters:  12/13/23 185 lb (83.9 kg)  12/07/23 181 lb (82.1 kg)  11/08/23 181 lb (82.1 kg)    BP 128/76   Pulse 73   Ht 5\' 10"  (1.778 m)   Wt 185 lb (83.9 kg)   SpO2 97%   BMI 26.54 kg/m   Assessment and Plan: 1. Hyperlipidemia, unspecified hyperlipidemia type (Primary) Chronic.  Probably controlled.  Stable.  Patient is currently on atorvastatin  10 mg once a day however he is experiencing some myalgias of the quads and perhaps hamstrings.  Patient would like to have a trial off of statins to see if symptomatology improves if so then we will continue off the atorvastatin  patient has been given a prescription for Zetia 10 mg once a day to start after he returns from his trip from [place].  In the meantime I had given him low-cholesterol low triglyceride dietary guidelines.  DRE was done normal patient in prostate was noted to be normal when he returns in 6 months I will suggest that he obtain a PSA from his new physician at that time.  Alayne Allis, MD

## 2023-12-13 NOTE — Patient Instructions (Signed)

## 2023-12-14 ENCOUNTER — Ambulatory Visit: Admitting: Physical Therapy

## 2023-12-14 DIAGNOSIS — M542 Cervicalgia: Secondary | ICD-10-CM | POA: Diagnosis not present

## 2023-12-14 DIAGNOSIS — M79601 Pain in right arm: Secondary | ICD-10-CM | POA: Diagnosis not present

## 2023-12-14 DIAGNOSIS — M6281 Muscle weakness (generalized): Secondary | ICD-10-CM

## 2023-12-14 NOTE — Therapy (Signed)
 OUTPATIENT PHYSICAL THERAPY NECK TREATMENT   Patient Name: Jeff Mcmahon MRN: [id number] DOB:11/24/1945, 78 y.o., male Today's Date: 12/14/2023  END OF SESSION:  PT End of Session - 12/14/23 1335     Visit Number 5    Number of Visits 13    Date for PT Re-Evaluation 01/12/24    Authorization Type Medicare A&B 2025    PT Start Time 1333    PT Stop Time 1415    PT Time Calculation (min) 42 min    Activity Tolerance Patient tolerated treatment well    Behavior During Therapy WFL for tasks assessed/performed              Past Medical History:  Diagnosis Date   Arthritis    Dysrhythmia 2007   H/O IRREGULAR HEART BEAT-SAW CARDIOLOGIST AND SAID IT WAS NOTHING TO BE CONCERNED WITH   GERD (gastroesophageal reflux disease)    H/O   Hyperlipidemia    Pre-diabetes    Past Surgical History:  Procedure Laterality Date   COLONOSCOPY     COLONOSCOPY WITH PROPOFOL  N/A 06/02/2020   Procedure: COLONOSCOPY WITH PROPOFOL ;  Surgeon: Selena Daily, MD;  Location: [facility];  Service: Gastroenterology;  Laterality: N/A;   ESOPHAGOGASTRODUODENOSCOPY     ESOPHAGOGASTRODUODENOSCOPY (EGD) WITH PROPOFOL  N/A 09/02/2020   Procedure: ESOPHAGOGASTRODUODENOSCOPY (EGD) WITH PROPOFOL ;  Surgeon: Selena Daily, MD;  Location: [facility];  Service: Gastroenterology;  Laterality: N/A;   EXCISION MASS NECK N/A 09/07/2016   Procedure: EXCISION MASS NECK;  Surgeon: Claudia Cuff, MD;  Location: [facility];  Service: General;  Laterality: N/A;   EXCISION OF BACK LESION N/A 09/07/2016   Procedure: EXCISION OF BACK LESION;  Surgeon: Claudia Cuff, MD;  Location: [facility];  Service: General;  Laterality: N/A;   MOUTH SURGERY     dental procedure   RETINAL LASER PROCEDURE Right 04/06/2021   [facility] in [place]   Patient Active Problem List   Diagnosis Date Noted   Cervical radicular pain 11/08/2023   Chronic pain syndrome 11/08/2023   Retinal tear of right eye  12/21/2021   Dysphagia    Cervical spondylosis 10/02/2015   Prediabetes 09/09/2015   History of colonic polyps 09/05/2015   Hyperlipidemia 09/04/2015   GERD (gastroesophageal reflux disease) 09/04/2015   Dry eyes 09/04/2015    PCP: Clarise Crooks, MD  REFERRING PROVIDER: Cephus Collin, MD  REFERRING DIAG:  M54.12 (ICD-10-CM) - Cervical radicular pain  M47.812 (ICD-10-CM) - Cervical spondylosis  G89.4 (ICD-10-CM) - Chronic pain syndrome  M54.2 (ICD-10-CM) - Cervicalgia    RATIONALE FOR EVALUATION AND TREATMENT: Rehabilitation  THERAPY DIAG: Cervicalgia  Pain in right arm  Muscle weakness (generalized)  ONSET DATE: Worsening pain since Feb 2024, chronic issue prior to that which was under control  FOLLOW-UP APPT SCHEDULED WITH REFERRING PROVIDER: Yes ; 12/07/23   PERTINENT HISTORY:  Pt is a 78 year old male referred for R-sided neck pain with R upper limb referral with atraumatic onset. Med hx including OA, GERD, HLD, pre-diabetes. MRI results have yet to be obtained from radiologist. Hx of C-spine X-rays with evidence of moderate degenerative changes and loss of lordosis.   Pt reports long-term problem that has gotten worse over last couple of months. He reports minor pain and motion deficits for approx 40 years. In Nov 2023, pt got a lot more stiffness in C-spine; R-sided neck pain primarily. Symptoms improved with Rx medication given by Dr. Augustus Ledger in the fall. Pt reports in February, symptoms got much  worse, up to 10/10. Prednisone  resolved pain completely, but pain came back in a couple of days. Pt reports notable pain with turning his head. Pt will consider ESI for 12/07/23. "Uneasiness" in neck at rest along posterior cervical spine. Symptoms are variable. Pt reports more issues with getting to sleep and with turning his head to change positions. Pt reports he can have posterior C-spine pain with wide jaw opening. No recent nausea, vomiting, vertigo, dizziness, visual changes.     Pain:  Pain Intensity: Present: 2/10, Best: 1/10, Worst: 10/10 Pain location: Posterior C-spine and occipital region Pain Quality: sharp  Radiating: No  Numbness/Tingling: Yes; numbness in arms with cervical extension, down to bilat forearms  Focal Weakness: No Aggravating factors: turning R more than L, lying flat in bed, turning over in bed, looking overhead Relieving factors: keeping head in pain-free range, sitting with good head support, limiting ROM, heat, Bengay 24-hour pain behavior: worse at night time   History of prior neck injury, pain, surgery, or therapy: Yes;  Hx of RTC-related pain in both shoulders Dominant hand: left  Imaging: Yes ; *Cervical spine X-rays 1. Moderate degenerative changes. No evidence for acute abnormality. 2. Loss of cervical lordosis.  *Cervical spine MRI completed 11/11/23 - awaiting release of radiologist report   Red flags (personal history of cancer, h/o spinal tumors, history of compression fracture, chills/fever, night sweats, nausea, vomiting, unrelenting pain): Negative   PRECAUTIONS: None  WEIGHT BEARING RESTRICTIONS: No  FALLS: Has patient fallen in last 6 months? No  Living Environment Lives with: lives with their spouse; son lives 20 minutes away  Lives in: House/apartment   Prior level of function: Independent  Occupational demands: Retired  Hobbies: Multimedia programmer, able to throw baseball with son/grandkids  Patient Goals: Able to resolve pain     OBJECTIVE (data from initial evaluation unless otherwise dated):    Patient Surveys  NDI 10/50 = 20%  Posture Increased thoracic kyphosis, Mild Dowager's hump, forward head. Resting in posterior pelvic tilt/kyphotic posture in sitting.   AROM AROM (Normal range in degrees) AROM 11/28/23  Cervical  Flexion (50) 25  Extension (80) 13* (Muscles "grab" back of neck)  Right lateral flexion (45) 15*  Left lateral flexion (45) 16*  Right rotation (85) 42  Left rotation  (85) 39  (* = pain; Blank rows = not tested)   MMT MMT (out of 5) Right 11/28/23 Left 11/28/23  Cervical (isometric)  Flexion WNL (numbness reproduced bilat forearms)  Extension WNL  Lateral Flexion WNL WNL  Rotation WNL*(numbness reproduced bilat forearms) WNL*      Shoulder   Flexion 4+ 4+  Extension    Abduction 4+ 4+* ( L shoulder pain)  Internal rotation 4+ 4+  External rotation  4+ 4+  Horizontal abduction    Horizontal adduction    Lower Trapezius    Rhomboids        Elbow  Flexion 5 5  Extension 5 5  Pronation    Supination        Wrist  Flexion 4+ 4+  Extension 5 5  Radial deviation    Ulnar deviation        (* = pain; Blank rows = not tested)  Sensation Grossly intact to light touch bilateral UE as determined by testing dermatomes C2-T2. Proprioception and hot/cold testing deferred on this date.  Reflexes R/L Elbow: 1+/1+  Brachioradialis: 2+/2+  Tricep: 2+/2+ UMN signs: Negative (Hoffman's and Clonus)  Palpation Location LEFT  RIGHT  Suboccipitals 1 1  Cervical paraspinals 1 1  Upper Trapezius 1 (n/t reproduced) 1 (n/t reproduced)  Levator Scapulae    Rhomboid Major/Minor    (Blank rows = not tested) Graded on 0-4 scale (0 = no pain, 1 = pain, 2 = pain with wincing/grimacing/flinching, 3 = pain with withdrawal, 4 = unwilling to allow palpation), (Blank rows = not tested)  Repeated Movements Repeated retraction: Mild pressure in back of head, no worse after  Passive Accessory Intervertebral Motion Significant hypomobility C3-C6, decreased sideglide bilaterally in mid to lower C-spine with pain at restriction   SPECIAL TESTS Spurlings A (ipsilateral lateral flexion/axial compression): R: Positive for neck pain L: Positive for neck pain Spurlings B (ipsilateral lateral flexion/contralateral rotation/axial compression): R: Negative L: Negative Cervical compression: Reproduction of N/T Distraction Test: No pain, residual numbness that  is not resolved  Hoffman Sign (cervical cord compression): R: Negative L: Negative ULTT Median: R: Negative L: Negative ULTT Ulnar: R: Positive for neck pain L: Negative ULTT Radial: R: Negative L: Negative   TODAY'S TREATMENT    SUBJECTIVE STATEMENT:   Patient reports improvement in pain since ESI. Patient reports 1/10 pain at arrival today; some discomfort affecting L paracervical region at arrival. Pt reports some soreness with median nerve flossing. Pt will be leaving this Saturday for his overseas trip.     Manual Therapy - for symptom modulation, soft tissue sensitivity and mobility, joint mobility, ROM   Cervical traction with pt sidelying L; 10 sec intervals; 2 bouts x 5 minutes  Bilateral cervical sideglides, C3-5 gr III for improved mobility; 2 x 30 sec on each side   -increase in numbess in L>R arm with R to L sideglide    STM/DTM bilateral C4-6 splenius cervicis x 10 minutes       *not today* Contact-relax technique for R and L rotation; antagonist contraction; x 5 for either direction   -Cervical spine rotation: R 55, L 50  PA mobilization in supine, using PT's 1st MCPs on each articular pillar   -numbness bilaterally increased with CPA at C4-6 Manual median nerve glides with maintenance of scapular depression, LUE; patient performing simultaneous cervical lateral flexion toward side of symptoms with extended position of wrist/elbow  X 20 repetitions    Therapeutic Exercise - for improved soft tissue flexibility and extensibility as needed for ROM, improved strength as needed to improve performance of CKC activities/functional movements     Supine, cervical sidebend stretch; 3 x 20 sec, to L and to R  Median nerve flossing; 1x10 on either side   -heavy verbal cueing and demonstration for technique, limited elbow/wrist extension exhibited by pt   -tactile cueing and manual guidance from PT  PATIENT EDUCATION: HEP update and review.     *not today* Repeated  cervical retraction, supine; 1 x 10  -reproduction of bilateral numbness, mildly remaining after  -minor numbness remaining after rest period  Repeated cervical flexion; 1 x 10  -numbness LUE>RUE   -after rest period 1 min, numbness remaining in L arm  Repeated cervical sidebend to L; 1 x 10 Scapular retractions; 1 x 10 and 1x6, 3 sec hold  -decreased reps on second set due to report of L UT pain      PATIENT EDUCATION:  Education details: see above for patient education details Person educated: Patient Education method: Explanation, Demonstration, and Handouts Education comprehension: verbalized understanding and returned demonstration   HOME EXERCISE PROGRAM:  Access Code: [email or web address] URL: [email or web address] Date: 12/14/2023 Prepared by: Jeanette Milks  Milon Aloe  Exercises - Seated Self Cervical Traction  - 2 x daily - 7 x weekly - 2 sets - 10 reps - 5-10sec hold - Supine Cervical Sidebending Stretch  - 2 x daily - 7 x weekly - 3 sets - 10 reps - 20-30sec hold - Median Nerve Flossing - Tray  - 2 x daily - 7 x weekly - 2 sets - 10 reps - 1sec hold - Seated Scapular Retraction  - 2 x daily - 7 x weekly - 2-3 sets - 5-8 reps - 3sec hold   ASSESSMENT:  CLINICAL IMPRESSION: Patient fortunately does seem to be improving following ESI. He does experience intermittent paresthesias into bilateral upper limbs with R to L sidegliding in mid-cervical region and with pushing into retracted or extended position. He exhibits modestly improving rotation compared to outset of PT. Pt does still have marked cervical spine stiffness. His HEP was updated to include gentle sidebend stretching in gravity-eliminated position. Pt will be out of PT x 2 weeks due to planned overseas trip. Pt has current deficits in C-spine AROM, mid to lower cervical spine stiffness/hypomobility, decreased tolerance to loading bilat deltoid/RTC, and postural changes/upper crossed presentation. Pt will continue to  benefit from skilled PT services to address deficits and improve function.   OBJECTIVE IMPAIRMENTS: decreased ROM, decreased strength, hypomobility, impaired flexibility, impaired UE functional use, postural dysfunction, and pain.   ACTIVITY LIMITATIONS: carrying, lifting, bending, sleeping, transfers, bed mobility, and reach over head  PARTICIPATION LIMITATIONS: meal prep, driving, shopping, community activity, golfing, and throwing baseball with son/grandson  PERSONAL FACTORS: Age, Past/current experiences, Time since onset of injury/illness/exacerbation, and 3+ comorbidities: (OA, HLD, pre-diabetes, chronic neck pain with acute flare-up) are also affecting patient's functional outcome.   REHAB POTENTIAL: Good  CLINICAL DECISION MAKING: Unstable/unpredictable  EVALUATION COMPLEXITY: High   GOALS: Goals reviewed with patient? Yes  SHORT TERM GOALS: Target date: 12/19/2023  Pt will be independent with HEP to improve strength and decrease neck pain to improve pain-free function at home and work. Baseline: 11/28/23: Baseline HEP initiated.  Goal status: INITIAL   LONG TERM GOALS: Target date: 01/12/2024  Pt will have cervical spine flexion/extension AROM to 30-35 deg or greater and C-spine rotation AROM to 60 deg or greater bilaterally as needed for completion of ADLs without functional limitation Baseline: 11/28/23:  Goal status: INITIAL  2.  Pt will decrease worst neck pain by at least 2 points on the NPRS in order to demonstrate clinically significant reduction in neck pain. Baseline: 11/28/23: 10/10 at worst.  Goal status: INITIAL  3.  Pt will decrease NDI score by at least 19% in order demonstrate clinically significant reduction in neck pain/disability.       Baseline: 11/28/23: 10/50 = 20% Goal status: INITIAL  4.  Pt will demonstrate golf swing and overhead throw with normal mechanics and no reproduction of upper quarter pain as needed for participation in golf/throwing  baseball with family. Baseline: 11/28/23: Pt currently unable to swing golf club or throw.  Goal status: INITIAL   PLAN: PT FREQUENCY: 1-2x/week  PT DURATION: 6 weeks  PLANNED INTERVENTIONS: Therapeutic exercises, Therapeutic activity, Neuromuscular re-education, Balance training, Gait training, Patient/Family education, Self Care, Joint mobilization, Joint manipulation, Vestibular training, Canalith repositioning, Orthotic/Fit training, DME instructions, Dry Needling, Electrical stimulation, Spinal manipulation, Spinal mobilization, Cryotherapy, Moist heat, Taping, Traction, Ultrasound, Ionotophoresis 4mg /ml Dexamethasone , Manual therapy, and Re-evaluation.  PLAN FOR NEXT SESSION: Traction in sidelying or consider seated as needed for symptom modulation. Utilize manual therapy/mobilization  techniques for C-spine mobility and progress with postural re-education drills as tolerated. Continue with neuromobilization techniques as tolerated.   Denese Finn, PT, DPT #[id number]  Aleatha Hunting, PT 12/14/2023, 1:35 PM

## 2023-12-19 ENCOUNTER — Encounter: Admitting: Physical Therapy

## 2023-12-19 ENCOUNTER — Encounter: Payer: Self-pay | Admitting: Physical Therapy

## 2023-12-21 ENCOUNTER — Encounter: Admitting: Physical Therapy

## 2023-12-23 ENCOUNTER — Other Ambulatory Visit: Payer: Self-pay | Admitting: Family Medicine

## 2023-12-23 DIAGNOSIS — E785 Hyperlipidemia, unspecified: Secondary | ICD-10-CM

## 2023-12-26 ENCOUNTER — Encounter: Admitting: Physical Therapy

## 2023-12-28 ENCOUNTER — Encounter: Admitting: Physical Therapy

## 2024-01-03 NOTE — Therapy (Unsigned)
 OUTPATIENT PHYSICAL THERAPY NECK TREATMENT   Patient Name: Jeff Mcmahon MRN: [id number] DOB:28-Apr-1946, 78 y.o., male Today's Date: 01/04/2024  END OF SESSION:  PT End of Session - 01/04/24 1333     Visit Number 6    Number of Visits 13    Date for PT Re-Evaluation 01/12/24    Authorization Type Medicare A&B 2025    PT Start Time 1333    PT Stop Time 1415    PT Time Calculation (min) 42 min    Activity Tolerance Patient tolerated treatment well    Behavior During Therapy WFL for tasks assessed/performed               Past Medical History:  Diagnosis Date   Arthritis    Dysrhythmia 2007   H/O IRREGULAR HEART BEAT-SAW CARDIOLOGIST AND SAID IT WAS NOTHING TO BE CONCERNED WITH   GERD (gastroesophageal reflux disease)    H/O   Hyperlipidemia    Pre-diabetes    Past Surgical History:  Procedure Laterality Date   COLONOSCOPY     COLONOSCOPY WITH PROPOFOL  N/A 06/02/2020   Procedure: COLONOSCOPY WITH PROPOFOL ;  Surgeon: Selena Daily, MD;  Location: [facility];  Service: Gastroenterology;  Laterality: N/A;   ESOPHAGOGASTRODUODENOSCOPY     ESOPHAGOGASTRODUODENOSCOPY (EGD) WITH PROPOFOL  N/A 09/02/2020   Procedure: ESOPHAGOGASTRODUODENOSCOPY (EGD) WITH PROPOFOL ;  Surgeon: Selena Daily, MD;  Location: [facility];  Service: Gastroenterology;  Laterality: N/A;   EXCISION MASS NECK N/A 09/07/2016   Procedure: EXCISION MASS NECK;  Surgeon: Claudia Cuff, MD;  Location: [facility];  Service: General;  Laterality: N/A;   EXCISION OF BACK LESION N/A 09/07/2016   Procedure: EXCISION OF BACK LESION;  Surgeon: Claudia Cuff, MD;  Location: [facility];  Service: General;  Laterality: N/A;   MOUTH SURGERY     dental procedure   RETINAL LASER PROCEDURE Right 04/06/2021   [facility]ist in [place]   Patient Active Problem List   Diagnosis Date Noted   Cervical radicular pain 11/08/2023   Chronic pain syndrome 11/08/2023   Retinal tear of right  eye 12/21/2021   Dysphagia    Cervical spondylosis 10/02/2015   Prediabetes 09/09/2015   History of colonic polyps 09/05/2015   Hyperlipidemia 09/04/2015   GERD (gastroesophageal reflux disease) 09/04/2015   Dry eyes 09/04/2015    PCP: Clarise Crooks, MD  REFERRING PROVIDER: Cephus Collin, MD  REFERRING DIAG:  M54.12 (ICD-10-CM) - Cervical radicular pain  M47.812 (ICD-10-CM) - Cervical spondylosis  G89.4 (ICD-10-CM) - Chronic pain syndrome  M54.2 (ICD-10-CM) - Cervicalgia    RATIONALE FOR EVALUATION AND TREATMENT: Rehabilitation  THERAPY DIAG: Cervicalgia  Pain in right arm  Muscle weakness (generalized)  ONSET DATE: Worsening pain since Feb 2024, chronic issue prior to that which was under control  FOLLOW-UP APPT SCHEDULED WITH REFERRING PROVIDER: Yes ; 12/07/23   PERTINENT HISTORY:  Pt is a 78 year old male referred for R-sided neck pain with R upper limb referral with atraumatic onset. Med hx including OA, GERD, HLD, pre-diabetes. MRI results have yet to be obtained from radiologist. Hx of C-spine X-rays with evidence of moderate degenerative changes and loss of lordosis.   Pt reports long-term problem that has gotten worse over last couple of months. He reports minor pain and motion deficits for approx 40 years. In Nov 2023, pt got a lot more stiffness in C-spine; R-sided neck pain primarily. Symptoms improved with Rx medication given by Dr. Augustus Ledger in the fall. Pt reports in February, symptoms got  much worse, up to 10/10. Prednisone  resolved pain completely, but pain came back in a couple of days. Pt reports notable pain with turning his head. Pt will consider ESI for 12/07/23. "Uneasiness" in neck at rest along posterior cervical spine. Symptoms are variable. Pt reports more issues with getting to sleep and with turning his head to change positions. Pt reports he can have posterior C-spine pain with wide jaw opening. No recent nausea, vomiting, vertigo, dizziness, visual  changes.    Pain:  Pain Intensity: Present: 2/10, Best: 1/10, Worst: 10/10 Pain location: Posterior C-spine and occipital region Pain Quality: sharp  Radiating: No  Numbness/Tingling: Yes; numbness in arms with cervical extension, down to bilat forearms  Focal Weakness: No Aggravating factors: turning R more than L, lying flat in bed, turning over in bed, looking overhead Relieving factors: keeping head in pain-free range, sitting with good head support, limiting ROM, heat, Bengay 24-hour pain behavior: worse at night time   History of prior neck injury, pain, surgery, or therapy: Yes;  Hx of RTC-related pain in both shoulders Dominant hand: left  Imaging: Yes ; *Cervical spine X-rays 1. Moderate degenerative changes. No evidence for acute abnormality. 2. Loss of cervical lordosis.  *Cervical spine MRI completed 11/11/23 - awaiting release of radiologist report   Red flags (personal history of cancer, h/o spinal tumors, history of compression fracture, chills/fever, night sweats, nausea, vomiting, unrelenting pain): Negative   PRECAUTIONS: None  WEIGHT BEARING RESTRICTIONS: No  FALLS: Has patient fallen in last 6 months? No  Living Environment Lives with: lives with their spouse; son lives 20 minutes away  Lives in: House/apartment   Prior level of function: Independent  Occupational demands: Retired  Hobbies: Multimedia programmer, able to throw baseball with son/grandkids  Patient Goals: Able to resolve pain     OBJECTIVE (data from initial evaluation unless otherwise dated):    Patient Surveys  NDI 10/50 = 20%  Posture Increased thoracic kyphosis, Mild Dowager's hump, forward head. Resting in posterior pelvic tilt/kyphotic posture in sitting.   AROM AROM (Normal range in degrees) AROM 11/28/23  Cervical  Flexion (50) 25  Extension (80) 13* (Muscles "grab" back of neck)  Right lateral flexion (45) 15*  Left lateral flexion (45) 16*  Right rotation (85) 42  Left  rotation (85) 39  (* = pain; Blank rows = not tested)   MMT MMT (out of 5) Right 11/28/23 Left 11/28/23  Cervical (isometric)  Flexion WNL (numbness reproduced bilat forearms)  Extension WNL  Lateral Flexion WNL WNL  Rotation WNL*(numbness reproduced bilat forearms) WNL*      Shoulder   Flexion 4+ 4+  Extension    Abduction 4+ 4+* ( L shoulder pain)  Internal rotation 4+ 4+  External rotation  4+ 4+  Horizontal abduction    Horizontal adduction    Lower Trapezius    Rhomboids        Elbow  Flexion 5 5  Extension 5 5  Pronation    Supination        Wrist  Flexion 4+ 4+  Extension 5 5  Radial deviation    Ulnar deviation        (* = pain; Blank rows = not tested)  Sensation Grossly intact to light touch bilateral UE as determined by testing dermatomes C2-T2. Proprioception and hot/cold testing deferred on this date.  Reflexes R/L Elbow: 1+/1+  Brachioradialis: 2+/2+  Tricep: 2+/2+ UMN signs: Negative (Hoffman's and Clonus)  Palpation Location LEFT  RIGHT  Suboccipitals 1 1  Cervical paraspinals 1 1  Upper Trapezius 1 (n/t reproduced) 1 (n/t reproduced)  Levator Scapulae    Rhomboid Major/Minor    (Blank rows = not tested) Graded on 0-4 scale (0 = no pain, 1 = pain, 2 = pain with wincing/grimacing/flinching, 3 = pain with withdrawal, 4 = unwilling to allow palpation), (Blank rows = not tested)  Repeated Movements Repeated retraction: Mild pressure in back of head, no worse after  Passive Accessory Intervertebral Motion Significant hypomobility C3-C6, decreased sideglide bilaterally in mid to lower C-spine with pain at restriction   SPECIAL TESTS Spurlings A (ipsilateral lateral flexion/axial compression): R: Positive for neck pain L: Positive for neck pain Spurlings B (ipsilateral lateral flexion/contralateral rotation/axial compression): R: Negative L: Negative Cervical compression: Reproduction of N/T Distraction Test: No pain, residual  numbness that is not resolved  Hoffman Sign (cervical cord compression): R: Negative L: Negative ULTT Median: R: Negative L: Negative ULTT Ulnar: R: Positive for neck pain L: Negative ULTT Radial: R: Negative L: Negative   TODAY'S TREATMENT    SUBJECTIVE STATEMENT:   Patient reports being notably limited with looking up for extended periods of time. He reports difficulty with looking up to see landmarks and artwork during his recent trip in [place]. Patient reports getting back on track with his HEP frequency. Patient reports some mild discomfort in posterior C-spine at rest. He reports more pain with attempting to turn his head. He reports some difficulty with nerve flossing technique. Patient reports 1/10 pain at baseline and 5/10 pain with rotation.     OBJECTIVE FINDINGS  Cervical Spine AROM Cervical flexion: 45 Cervical extension: 23 Lateral flexion: R 15, L 10 Cervical spine rotation: R 55, L 53    Manual Therapy - for symptom modulation, soft tissue sensitivity and mobility, joint mobility, ROM   CPA along C3-4 and C5-7; therapist's 1st MCP along articular pillars bilat; 2 x 30 sec bouts  Bilateral cervical sideglides, gr III for improved mobility, C3-6; 3 x 30 sec on each side   -increase in numbess in L>R arm with R to L sideglide  DTM/TPR bilateral upper traps; x 5 minutes      *not today* Cervical traction with pt sidelying L; 10 sec intervals; 2 bouts x 5 minutes  STM/DTM bilateral C4-6 splenius cervicis x 10 minutes Contact-relax technique for R and L rotation; antagonist contraction; x 5 for either direction   -Cervical spine rotation: R 55, L 50  PA mobilization in supine, using PT's 1st MCPs on each articular pillar   -numbness bilaterally increased with CPA at C4-6 Manual median nerve glides with maintenance of scapular depression, LUE; patient performing simultaneous cervical lateral flexion toward side of symptoms with extended position of wrist/elbow  X 20  repetitions    Therapeutic Exercise - for improved soft tissue flexibility and extensibility as needed for ROM, improved strength as needed to improve performance of CKC activities/functional movements     Supine, cervical sidebend stretch; 2 x 30 sec, to L and to R   -mod verbal cueing and demo for technique/lateral flexion versus rotation  Cervical SNAG for rotation; 1 x 10 to R and L   -demo and verbal cueing for technique  Cervical SNAG for extension; 1 x 10  PATIENT EDUCATION: HEP update and review; provided new [employer] handout.     Cervical AROM post-treatment:  Flexion: 50  Extension: 27  Lateral flexion: R 17, L 10  Rotation: R 59, L 62   *not  today* Median nerve flossing; 1x10 on either side Repeated cervical retraction, supine; 1 x 10  -reproduction of bilateral numbness, mildly remaining after  -minor numbness remaining after rest period  Repeated cervical flexion; 1 x 10  -numbness LUE>RUE   -after rest period 1 min, numbness remaining in L arm  Repeated cervical sidebend to L; 1 x 10 Scapular retractions; 1 x 10 and 1x6, 3 sec hold  -decreased reps on second set due to report of L UT pain      PATIENT EDUCATION:  Education details: see above for patient education details Person educated: Patient Education method: Explanation, Demonstration, and Handouts Education comprehension: verbalized understanding and returned demonstration   HOME EXERCISE PROGRAM:  Access Code: [email or web address] URL: [email or web address] Date: 01/04/2024 Prepared by: Denese Finn  Exercises - Seated Self Cervical Traction  - 2 x daily - 7 x weekly - 2 sets - 10 reps - 5-10sec hold - Supine Cervical Sidebending Stretch  - 2 x daily - 7 x weekly - 3 sets - 10 reps - 20-30sec hold - Median Nerve Flossing - Tray  - 2 x daily - 7 x weekly - 2 sets - 10 reps - 1sec hold - Seated Scapular Retraction  - 2 x daily - 7 x weekly - 2-3 sets - 5-8 reps - 3sec hold - Seated  Assisted Cervical Rotation with Towel  - 2 x daily - 7 x weekly - 1-2 sets - 10 reps - Mid-Lower Cervical Extension SNAG with Strap  - 2 x daily - 7 x weekly - 1-2 sets - 10 reps   ASSESSMENT:  CLINICAL IMPRESSION: Patient does have notably improved sensitivity of posterior cervical spine musculature/paraspinals and lessening re-occurrence of upper limb paresthesias following ESI. He exhibits modestly improved C-spine AROM; he reports some regression with not being able to fully participate in PT with his recent trip itinerary. He feels that ROM has improved with consistent HEP. Flexion/extension and R/L rotation is improved post-treatment today modestly. Pt does still have notable mobility deficits; his HEP was updated to include SNAG self-mobilization techniques within limits of pain. Pt is following up with Dr. Rhesa Celeste again next Thursday 01/12/24. Pt has current deficits in C-spine AROM, mid to lower cervical spine stiffness/hypomobility, decreased tolerance to loading bilat deltoid/RTC, and postural changes/upper crossed presentation. Pt will continue to benefit from skilled PT services to address deficits and improve function.   OBJECTIVE IMPAIRMENTS: decreased ROM, decreased strength, hypomobility, impaired flexibility, impaired UE functional use, postural dysfunction, and pain.   ACTIVITY LIMITATIONS: carrying, lifting, bending, sleeping, transfers, bed mobility, and reach over head  PARTICIPATION LIMITATIONS: meal prep, driving, shopping, community activity, golfing, and throwing baseball with son/grandson  PERSONAL FACTORS: Age, Past/current experiences, Time since onset of injury/illness/exacerbation, and 3+ comorbidities: (OA, HLD, pre-diabetes, chronic neck pain with acute flare-up) are also affecting patient's functional outcome.   REHAB POTENTIAL: Good  CLINICAL DECISION MAKING: Unstable/unpredictable  EVALUATION COMPLEXITY: High   GOALS: Goals reviewed with patient?  Yes  SHORT TERM GOALS: Target date: 12/19/2023  Pt will be independent with HEP to improve strength and decrease neck pain to improve pain-free function at home and work. Baseline: 11/28/23: Baseline HEP initiated.  Goal status: INITIAL   LONG TERM GOALS: Target date: 01/12/2024  Pt will have cervical spine flexion/extension AROM to 30-35 deg or greater and C-spine rotation AROM to 60 deg or greater bilaterally as needed for completion of ADLs without functional limitation Baseline: 11/28/23:  Goal status: INITIAL  2.  Pt will  decrease worst neck pain by at least 2 points on the NPRS in order to demonstrate clinically significant reduction in neck pain. Baseline: 11/28/23: 10/10 at worst.  Goal status: INITIAL  3.  Pt will decrease NDI score by at least 19% in order demonstrate clinically significant reduction in neck pain/disability.       Baseline: 11/28/23: 10/50 = 20% Goal status: INITIAL  4.  Pt will demonstrate golf swing and overhead throw with normal mechanics and no reproduction of upper quarter pain as needed for participation in golf/throwing baseball with family. Baseline: 11/28/23: Pt currently unable to swing golf club or throw.  Goal status: INITIAL   PLAN: PT FREQUENCY: 1-2x/week  PT DURATION: 6 weeks  PLANNED INTERVENTIONS: Therapeutic exercises, Therapeutic activity, Neuromuscular re-education, Balance training, Gait training, Patient/Family education, Self Care, Joint mobilization, Joint manipulation, Vestibular training, Canalith repositioning, Orthotic/Fit training, DME instructions, Dry Needling, Electrical stimulation, Spinal manipulation, Spinal mobilization, Cryotherapy, Moist heat, Taping, Traction, Ultrasound, Ionotophoresis 4mg /ml Dexamethasone , Manual therapy, and Re-evaluation.  PLAN FOR NEXT SESSION: Traction in sidelying or consider seated as needed for symptom modulation. Utilize manual therapy/mobilization techniques for C-spine mobility and progress with  postural re-education drills as tolerated. Continue with neuromobilization techniques as tolerated.   Denese Finn, PT, DPT #[id number]  Aleatha Hunting, PT 01/04/2024, 1:33 PM

## 2024-01-04 ENCOUNTER — Encounter: Payer: Self-pay | Admitting: Physical Therapy

## 2024-01-04 ENCOUNTER — Ambulatory Visit: Admitting: Physical Therapy

## 2024-01-04 DIAGNOSIS — M542 Cervicalgia: Secondary | ICD-10-CM | POA: Diagnosis not present

## 2024-01-04 DIAGNOSIS — M6281 Muscle weakness (generalized): Secondary | ICD-10-CM

## 2024-01-04 DIAGNOSIS — M79601 Pain in right arm: Secondary | ICD-10-CM | POA: Diagnosis not present

## 2024-01-09 ENCOUNTER — Ambulatory Visit: Attending: Student in an Organized Health Care Education/Training Program | Admitting: Physical Therapy

## 2024-01-09 DIAGNOSIS — M6281 Muscle weakness (generalized): Secondary | ICD-10-CM | POA: Diagnosis not present

## 2024-01-09 DIAGNOSIS — M542 Cervicalgia: Secondary | ICD-10-CM | POA: Insufficient documentation

## 2024-01-09 DIAGNOSIS — M25522 Pain in left elbow: Secondary | ICD-10-CM | POA: Insufficient documentation

## 2024-01-09 DIAGNOSIS — M79601 Pain in right arm: Secondary | ICD-10-CM | POA: Insufficient documentation

## 2024-01-09 NOTE — Therapy (Signed)
 OUTPATIENT PHYSICAL THERAPY NECK TREATMENT   Patient Name: Jeff Mcmahon MRN: [id number] DOB:03-13-1946, 78 y.o., male Today's Date: 01/09/2024  END OF SESSION:  PT End of Session - 01/09/24 1326     Visit Number 7    Number of Visits 13    Date for PT Re-Evaluation 01/12/24    Authorization Type Medicare A&B 2025    PT Start Time 1327    PT Stop Time 1410    PT Time Calculation (min) 43 min    Activity Tolerance Patient tolerated treatment well    Behavior During Therapy WFL for tasks assessed/performed                Past Medical History:  Diagnosis Date   Arthritis    Dysrhythmia 2007   H/O IRREGULAR HEART BEAT-SAW CARDIOLOGIST AND SAID IT WAS NOTHING TO BE CONCERNED WITH   GERD (gastroesophageal reflux disease)    H/O   Hyperlipidemia    Pre-diabetes    Past Surgical History:  Procedure Laterality Date   COLONOSCOPY     COLONOSCOPY WITH PROPOFOL  N/A 06/02/2020   Procedure: COLONOSCOPY WITH PROPOFOL ;  Surgeon: Selena Daily, MD;  Location: [facility] ENDOSCOPY;  Service: Gastroenterology;  Laterality: N/A;   ESOPHAGOGASTRODUODENOSCOPY     ESOPHAGOGASTRODUODENOSCOPY (EGD) WITH PROPOFOL  N/A 09/02/2020   Procedure: ESOPHAGOGASTRODUODENOSCOPY (EGD) WITH PROPOFOL ;  Surgeon: Selena Daily, MD;  Location: [facility]COPY;  Service: Gastroenterology;  Laterality: N/A;   EXCISION MASS NECK N/A 09/07/2016   Procedure: EXCISION MASS NECK;  Surgeon: Claudia Cuff, MD;  Location: [facility] ORS;  Service: General;  Laterality: N/A;   EXCISION OF BACK LESION N/A 09/07/2016   Procedure: EXCISION OF BACK LESION;  Surgeon: Claudia Cuff, MD;  Location: [facility] ORS;  Service: General;  Laterality: N/A;   MOUTH SURGERY     dental procedure   RETINAL LASER PROCEDURE Right 04/06/2021   [facility] in [place]   Patient Active Problem List   Diagnosis Date Noted   Cervical radicular pain 11/08/2023   Chronic pain syndrome 11/08/2023   Retinal tear of right  eye 12/21/2021   Dysphagia    Cervical spondylosis 10/02/2015   Prediabetes 09/09/2015   History of colonic polyps 09/05/2015   Hyperlipidemia 09/04/2015   GERD (gastroesophageal reflux disease) 09/04/2015   Dry eyes 09/04/2015    PCP: Clarise Crooks, MD  REFERRING PROVIDER: Cephus Collin, MD  REFERRING DIAG:  M54.12 (ICD-10-CM) - Cervical radicular pain  M47.812 (ICD-10-CM) - Cervical spondylosis  G89.4 (ICD-10-CM) - Chronic pain syndrome  M54.2 (ICD-10-CM) - Cervicalgia    RATIONALE FOR EVALUATION AND TREATMENT: Rehabilitation  THERAPY DIAG: Cervicalgia  Pain in right arm  Muscle weakness (generalized)  ONSET DATE: Worsening pain since Feb 2024, chronic issue prior to that which was under control  FOLLOW-UP APPT SCHEDULED WITH REFERRING PROVIDER: Yes ; 12/07/23   PERTINENT HISTORY:  Pt is a 78 year old male referred for R-sided neck pain with R upper limb referral with atraumatic onset. Med hx including OA, GERD, HLD, pre-diabetes. MRI results have yet to be obtained from radiologist. Hx of C-spine X-rays with evidence of moderate degenerative changes and loss of lordosis.   Pt reports long-term problem that has gotten worse over last couple of months. He reports minor pain and motion deficits for approx 40 years. In Nov 2023, pt got a lot more stiffness in C-spine; R-sided neck pain primarily. Symptoms improved with Rx medication given by Dr. Augustus Ledger in the fall. Pt reports in February, symptoms  got much worse, up to 10/10. Prednisone  resolved pain completely, but pain came back in a couple of days. Pt reports notable pain with turning his head. Pt will consider ESI for 12/07/23. "Uneasiness" in neck at rest along posterior cervical spine. Symptoms are variable. Pt reports more issues with getting to sleep and with turning his head to change positions. Pt reports he can have posterior C-spine pain with wide jaw opening. No recent nausea, vomiting, vertigo, dizziness, visual  changes.    Pain:  Pain Intensity: Present: 2/10, Best: 1/10, Worst: 10/10 Pain location: Posterior C-spine and occipital region Pain Quality: sharp  Radiating: No  Numbness/Tingling: Yes; numbness in arms with cervical extension, down to bilat forearms  Focal Weakness: No Aggravating factors: turning R more than L, lying flat in bed, turning over in bed, looking overhead Relieving factors: keeping head in pain-free range, sitting with good head support, limiting ROM, heat, Bengay 24-hour pain behavior: worse at night time   History of prior neck injury, pain, surgery, or therapy: Yes;  Hx of RTC-related pain in both shoulders Dominant hand: left  Imaging: Yes ; *Cervical spine X-rays 1. Moderate degenerative changes. No evidence for acute abnormality. 2. Loss of cervical lordosis.  *Cervical spine MRI completed 11/11/23 - awaiting release of radiologist report   Red flags (personal history of cancer, h/o spinal tumors, history of compression fracture, chills/fever, night sweats, nausea, vomiting, unrelenting pain): Negative   PRECAUTIONS: None  WEIGHT BEARING RESTRICTIONS: No  FALLS: Has patient fallen in last 6 months? No  Living Environment Lives with: lives with their spouse; son lives 20 minutes away  Lives in: House/apartment   Prior level of function: Independent  Occupational demands: Retired  Hobbies: Multimedia programmer, able to throw baseball with son/grandkids  Patient Goals: Able to resolve pain     OBJECTIVE (data from initial evaluation unless otherwise dated):    Patient Surveys  NDI 10/50 = 20%  Posture Increased thoracic kyphosis, Mild Dowager's hump, forward head. Resting in posterior pelvic tilt/kyphotic posture in sitting.   AROM AROM (Normal range in degrees) AROM 11/28/23  Cervical  Flexion (50) 25  Extension (80) 13* (Muscles "grab" back of neck)  Right lateral flexion (45) 15*  Left lateral flexion (45) 16*  Right rotation (85) 42  Left  rotation (85) 39  (* = pain; Blank rows = not tested)   MMT MMT (out of 5) Right 11/28/23 Left 11/28/23  Cervical (isometric)  Flexion WNL (numbness reproduced bilat forearms)  Extension WNL  Lateral Flexion WNL WNL  Rotation WNL*(numbness reproduced bilat forearms) WNL*      Shoulder   Flexion 4+ 4+  Extension    Abduction 4+ 4+* ( L shoulder pain)  Internal rotation 4+ 4+  External rotation  4+ 4+  Horizontal abduction    Horizontal adduction    Lower Trapezius    Rhomboids        Elbow  Flexion 5 5  Extension 5 5  Pronation    Supination        Wrist  Flexion 4+ 4+  Extension 5 5  Radial deviation    Ulnar deviation        (* = pain; Blank rows = not tested)  Sensation Grossly intact to light touch bilateral UE as determined by testing dermatomes C2-T2. Proprioception and hot/cold testing deferred on this date.  Reflexes R/L Elbow: 1+/1+  Brachioradialis: 2+/2+  Tricep: 2+/2+ UMN signs: Negative (Hoffman's and Clonus)  Palpation Location LEFT  RIGHT  Suboccipitals 1 1  Cervical paraspinals 1 1  Upper Trapezius 1 (n/t reproduced) 1 (n/t reproduced)  Levator Scapulae    Rhomboid Major/Minor    (Blank rows = not tested) Graded on 0-4 scale (0 = no pain, 1 = pain, 2 = pain with wincing/grimacing/flinching, 3 = pain with withdrawal, 4 = unwilling to allow palpation), (Blank rows = not tested)  Repeated Movements Repeated retraction: Mild pressure in back of head, no worse after  Passive Accessory Intervertebral Motion Significant hypomobility C3-C6, decreased sideglide bilaterally in mid to lower C-spine with pain at restriction   SPECIAL TESTS Spurlings A (ipsilateral lateral flexion/axial compression): R: Positive for neck pain L: Positive for neck pain Spurlings B (ipsilateral lateral flexion/contralateral rotation/axial compression): R: Negative L: Negative Cervical compression: Reproduction of N/T Distraction Test: No pain, residual  numbness that is not resolved  Hoffman Sign (cervical cord compression): R: Negative L: Negative ULTT Median: R: Negative L: Negative ULTT Ulnar: R: Positive for neck pain L: Negative ULTT Radial: R: Negative L: Negative   TODAY'S TREATMENT    SUBJECTIVE STATEMENT:   Patient reports some feeling of "tiredness" along cervical paraspinals with traction at home. Patient reports some discomfort with use of traction mildly. He denies recent significant paresthesias. Patient reports some misunderstanding with SNAG technique and asks for clarity on this.   OBJECTIVE FINDINGS  Cervical Spine AROM Cervical flexion: WNL Cervical extension: 23 Cervical spine rotation: R 55, L 55    Manual Therapy - for symptom modulation, soft tissue sensitivity and mobility, joint mobility, ROM   STM/DTM bilateral C4-6 splenius cervicis x 10 minutes CPA along C3-4 and C5-7; therapist's 1st MCP along articular pillars bilat; 2 x 30 sec bouts  Bilateral cervical sideglides, gr III for improved mobility, C3-6; 3 x 30 sec on each side   -increase in numbess in L>R arm with R to L sideglide  DTM/TPR bilateral upper traps; x 5 minutes      *not today* Cervical traction with pt sidelying L; 10 sec intervals; 2 bouts x 5 minutes Contact-relax technique for R and L rotation; antagonist contraction; x 5 for either direction   -Cervical spine rotation: R 55, L 50  PA mobilization in supine, using PT's 1st MCPs on each articular pillar   -numbness bilaterally increased with CPA at C4-6 Manual median nerve glides with maintenance of scapular depression, LUE; patient performing simultaneous cervical lateral flexion toward side of symptoms with extended position of wrist/elbow  X 20 repetitions    Therapeutic Exercise - for improved soft tissue flexibility and extensibility as needed for ROM, improved strength as needed to improve performance of CKC activities/functional movements     Cervical SNAG for  rotation; 1 x 10 to R and L   In sitting: muscle energy technique for cervical rotation; antagonist contraction; 5-sec isometric; x 5 for R and L rotation   Standing row with Blue Tband; 2 x 10, 3 sec hold  -PT tactile cueing for scap retraction/depression   Standing extension with Blue Tband; 2 x 10, 3 sec hold  -PT tactile cueing for scap retraction/depression  -verbal cueing for eccentric control    PATIENT EDUCATION: HEP reviewed     *not today*   Supine, cervical sidebend stretch; 2 x 30 sec, to L and to R   -mod verbal cueing and demo for technique/lateral flexion versus rotation Median nerve flossing; 1x10 on either side Repeated cervical retraction, supine; 1 x 10  -reproduction of bilateral numbness, mildly remaining after  -  minor numbness remaining after rest period  Repeated cervical flexion; 1 x 10  -numbness LUE>RUE   -after rest period 1 min, numbness remaining in L arm  Repeated cervical sidebend to L; 1 x 10 Scapular retractions; 1 x 10 and 1x6, 3 sec hold  -decreased reps on second set due to report of L UT pain      PATIENT EDUCATION:  Education details: see above for patient education details Person educated: Patient Education method: Explanation, Demonstration, and Handouts Education comprehension: verbalized understanding and returned demonstration   HOME EXERCISE PROGRAM:  Access Code: [email or web address] URL: [email or web address] Date: 01/04/2024 Prepared by: Denese Finn  Exercises - Seated Self Cervical Traction  - 2 x daily - 7 x weekly - 2 sets - 10 reps - 5-10sec hold - Supine Cervical Sidebending Stretch  - 2 x daily - 7 x weekly - 3 sets - 10 reps - 20-30sec hold - Median Nerve Flossing - Tray  - 2 x daily - 7 x weekly - 2 sets - 10 reps - 1sec hold - Seated Scapular Retraction  - 2 x daily - 7 x weekly - 2-3 sets - 5-8 reps - 3sec hold - Seated Assisted Cervical Rotation with Towel  - 2 x daily - 7 x weekly - 1-2 sets - 10  reps - Mid-Lower Cervical Extension SNAG with Strap  - 2 x daily - 7 x weekly - 1-2 sets - 10 reps   ASSESSMENT:  CLINICAL IMPRESSION: Patient still presents with mild motion deficits. Movement has improved overall, but he does not have substantial increase in extension or rotation ROM compared to that obtained at previous visit. Pt fortunately has low-level symptoms and no remarkable c/o paresthesias at this time following ESI. Pt has done well with continuation of stretching, self-mobilization, and postural re-edu drills at home. Pt is following up with Dr. Lateef again Thursday 01/12/24. Pt has current deficits in C-spine AROM, mid to lower cervical spine stiffness/hypomobility, decreased tolerance to loading bilat deltoid/RTC, and postural changes/upper crossed presentation. Pt will continue to benefit from skilled PT services to address deficits and improve function.   OBJECTIVE IMPAIRMENTS: decreased ROM, decreased strength, hypomobility, impaired flexibility, impaired UE functional use, postural dysfunction, and pain.   ACTIVITY LIMITATIONS: carrying, lifting, bending, sleeping, transfers, bed mobility, and reach over head  PARTICIPATION LIMITATIONS: meal prep, driving, shopping, community activity, golfing, and throwing baseball with son/grandson  PERSONAL FACTORS: Age, Past/current experiences, Time since onset of injury/illness/exacerbation, and 3+ comorbidities: (OA, HLD, pre-diabetes, chronic neck pain with acute flare-up) are also affecting patient's functional outcome.   REHAB POTENTIAL: Good  CLINICAL DECISION MAKING: Unstable/unpredictable  EVALUATION COMPLEXITY: High   GOALS: Goals reviewed with patient? Yes  SHORT TERM GOALS: Target date: 12/19/2023  Pt will be independent with HEP to improve strength and decrease neck pain to improve pain-free function at home and work. Baseline: 11/28/23: Baseline HEP initiated.  Goal status: INITIAL   LONG TERM GOALS: Target date:  01/12/2024  Pt will have cervical spine flexion/extension AROM to 30-35 deg or greater and C-spine rotation AROM to 60 deg or greater bilaterally as needed for completion of ADLs without functional limitation Baseline: 11/28/23:  Goal status: INITIAL  2.  Pt will decrease worst neck pain by at least 2 points on the NPRS in order to demonstrate clinically significant reduction in neck pain. Baseline: 11/28/23: 10/10 at worst.  Goal status: INITIAL  3.  Pt will decrease NDI score by at least 19% in order demonstrate clinically  significant reduction in neck pain/disability.       Baseline: 11/28/23: 10/50 = 20% Goal status: INITIAL  4.  Pt will demonstrate golf swing and overhead throw with normal mechanics and no reproduction of upper quarter pain as needed for participation in golf/throwing baseball with family. Baseline: 11/28/23: Pt currently unable to swing golf club or throw.  Goal status: INITIAL   PLAN: PT FREQUENCY: 1-2x/week  PT DURATION: 6 weeks  PLANNED INTERVENTIONS: Therapeutic exercises, Therapeutic activity, Neuromuscular re-education, Balance training, Gait training, Patient/Family education, Self Care, Joint mobilization, Joint manipulation, Vestibular training, Canalith repositioning, Orthotic/Fit training, DME instructions, Dry Needling, Electrical stimulation, Spinal manipulation, Spinal mobilization, Cryotherapy, Moist heat, Taping, Traction, Ultrasound, Ionotophoresis 4mg /ml Dexamethasone , Manual therapy, and Re-evaluation.  PLAN FOR NEXT SESSION: Traction in sidelying or consider seated as needed for symptom modulation. Utilize manual therapy/mobilization techniques for C-spine mobility and progress with postural re-education drills as tolerated. Continue with neuromobilization techniques as tolerated.   Denese Finn, PT, DPT #[id number]  Aleatha Hunting, PT 01/09/2024, 1:27 PM

## 2024-01-10 ENCOUNTER — Encounter: Payer: Self-pay | Admitting: Physical Therapy

## 2024-01-11 ENCOUNTER — Encounter: Payer: Self-pay | Admitting: Physical Therapy

## 2024-01-11 ENCOUNTER — Ambulatory Visit: Admitting: Physical Therapy

## 2024-01-11 DIAGNOSIS — H2513 Age-related nuclear cataract, bilateral: Secondary | ICD-10-CM | POA: Diagnosis not present

## 2024-01-11 DIAGNOSIS — M542 Cervicalgia: Secondary | ICD-10-CM | POA: Diagnosis not present

## 2024-01-11 DIAGNOSIS — H524 Presbyopia: Secondary | ICD-10-CM | POA: Diagnosis not present

## 2024-01-11 DIAGNOSIS — M25522 Pain in left elbow: Secondary | ICD-10-CM

## 2024-01-11 DIAGNOSIS — M6281 Muscle weakness (generalized): Secondary | ICD-10-CM

## 2024-01-11 DIAGNOSIS — M79601 Pain in right arm: Secondary | ICD-10-CM | POA: Diagnosis not present

## 2024-01-11 DIAGNOSIS — H52223 Regular astigmatism, bilateral: Secondary | ICD-10-CM | POA: Diagnosis not present

## 2024-01-11 DIAGNOSIS — H5203 Hypermetropia, bilateral: Secondary | ICD-10-CM | POA: Diagnosis not present

## 2024-01-11 DIAGNOSIS — H04122 Dry eye syndrome of left lacrimal gland: Secondary | ICD-10-CM | POA: Diagnosis not present

## 2024-01-11 DIAGNOSIS — H43811 Vitreous degeneration, right eye: Secondary | ICD-10-CM | POA: Diagnosis not present

## 2024-01-11 NOTE — Therapy (Signed)
 OUTPATIENT PHYSICAL THERAPY NECK TREATMENT   Patient Name: Jeff Mcmahon MRN: [id number] DOB:Jan 01, 1946, 78 y.o., male Today's Date: 01/11/2024  END OF SESSION:  PT End of Session - 01/11/24 1342     Visit Number 8    Number of Visits 13    Date for PT Re-Evaluation 01/12/24    Authorization Type Medicare A&B 2025    PT Start Time 1341    PT Stop Time 1422    PT Time Calculation (min) 41 min    Activity Tolerance Patient tolerated treatment well    Behavior During Therapy WFL for tasks assessed/performed                 Past Medical History:  Diagnosis Date   Arthritis    Dysrhythmia 2007   H/O IRREGULAR HEART BEAT-SAW CARDIOLOGIST AND SAID IT WAS NOTHING TO BE CONCERNED WITH   GERD (gastroesophageal reflux disease)    H/O   Hyperlipidemia    Pre-diabetes    Past Surgical History:  Procedure Laterality Date   COLONOSCOPY     COLONOSCOPY WITH PROPOFOL  N/A 06/02/2020   Procedure: COLONOSCOPY WITH PROPOFOL ;  Surgeon: Selena Daily, MD;  Location: [facility];  Service: Gastroenterology;  Laterality: N/A;   ESOPHAGOGASTRODUODENOSCOPY     ESOPHAGOGASTRODUODENOSCOPY (EGD) WITH PROPOFOL  N/A 09/02/2020   Procedure: ESOPHAGOGASTRODUODENOSCOPY (EGD) WITH PROPOFOL ;  Surgeon: Selena Daily, MD;  Location: [facility];  Service: Gastroenterology;  Laterality: N/A;   EXCISION MASS NECK N/A 09/07/2016   Procedure: EXCISION MASS NECK;  Surgeon: Claudia Cuff, MD;  Location: [facility];  Service: General;  Laterality: N/A;   EXCISION OF BACK LESION N/A 09/07/2016   Procedure: EXCISION OF BACK LESION;  Surgeon: Claudia Cuff, MD;  Location: [facility];  Service: General;  Laterality: N/A;   MOUTH SURGERY     dental procedure   RETINAL LASER PROCEDURE Right 04/06/2021   [facility] in [place]   Patient Active Problem List   Diagnosis Date Noted   Cervical radicular pain 11/08/2023   Chronic pain syndrome 11/08/2023   Retinal tear of  right eye 12/21/2021   Dysphagia    Cervical spondylosis 10/02/2015   Prediabetes 09/09/2015   History of colonic polyps 09/05/2015   Hyperlipidemia 09/04/2015   GERD (gastroesophageal reflux disease) 09/04/2015   Dry eyes 09/04/2015    PCP: Clarise Crooks, MD  REFERRING PROVIDER: Cephus Collin, MD  REFERRING DIAG:  M54.12 (ICD-10-CM) - Cervical radicular pain  M47.812 (ICD-10-CM) - Cervical spondylosis  G89.4 (ICD-10-CM) - Chronic pain syndrome  M54.2 (ICD-10-CM) - Cervicalgia    RATIONALE FOR EVALUATION AND TREATMENT: Rehabilitation  THERAPY DIAG: Cervicalgia  Pain in right arm  Muscle weakness (generalized)  Pain in left elbow  ONSET DATE: Worsening pain since Feb 2024, chronic issue prior to that which was under control  FOLLOW-UP APPT SCHEDULED WITH REFERRING PROVIDER: Yes ; 12/07/23   PERTINENT HISTORY:  Pt is a 78 year old male referred for R-sided neck pain with R upper limb referral with atraumatic onset. Med hx including OA, GERD, HLD, pre-diabetes. MRI results have yet to be obtained from radiologist. Hx of C-spine X-rays with evidence of moderate degenerative changes and loss of lordosis.   Pt reports long-term problem that has gotten worse over last couple of months. He reports minor pain and motion deficits for approx 40 years. In Nov 2023, pt got a lot more stiffness in C-spine; R-sided neck pain primarily. Symptoms improved with Rx medication given by Dr. Augustus Ledger in the  fall. Pt reports in February, symptoms got much worse, up to 10/10. Prednisone  resolved pain completely, but pain came back in a couple of days. Pt reports notable pain with turning his head. Pt will consider ESI for 12/07/23. "Uneasiness" in neck at rest along posterior cervical spine. Symptoms are variable. Pt reports more issues with getting to sleep and with turning his head to change positions. Pt reports he can have posterior C-spine pain with wide jaw opening. No recent nausea, vomiting,  vertigo, dizziness, visual changes.    Pain:  Pain Intensity: Present: 2/10, Best: 1/10, Worst: 10/10 Pain location: Posterior C-spine and occipital region Pain Quality: sharp  Radiating: No  Numbness/Tingling: Yes; numbness in arms with cervical extension, down to bilat forearms  Focal Weakness: No Aggravating factors: turning R more than L, lying flat in bed, turning over in bed, looking overhead Relieving factors: keeping head in pain-free range, sitting with good head support, limiting ROM, heat, Bengay 24-hour pain behavior: worse at night time   History of prior neck injury, pain, surgery, or therapy: Yes;  Hx of RTC-related pain in both shoulders Dominant hand: left  Imaging: Yes ; *Cervical spine X-rays 1. Moderate degenerative changes. No evidence for acute abnormality. 2. Loss of cervical lordosis.  *Cervical spine MRI completed 11/11/23 - awaiting release of radiologist report   Red flags (personal history of cancer, h/o spinal tumors, history of compression fracture, chills/fever, night sweats, nausea, vomiting, unrelenting pain): Negative   PRECAUTIONS: None  WEIGHT BEARING RESTRICTIONS: No  FALLS: Has patient fallen in last 6 months? No  Living Environment Lives with: lives with their spouse; son lives 20 minutes away  Lives in: House/apartment   Prior level of function: Independent  Occupational demands: Retired  Hobbies: Multimedia programmer, able to throw baseball with son/grandkids  Patient Goals: Able to resolve pain     OBJECTIVE (data from initial evaluation unless otherwise dated):    Patient Surveys  NDI 10/50 = 20%  Posture Increased thoracic kyphosis, Mild Dowager's hump, forward head. Resting in posterior pelvic tilt/kyphotic posture in sitting.   AROM AROM (Normal range in degrees) AROM 11/28/23  Cervical  Flexion (50) 25  Extension (80) 13* (Muscles "grab" back of neck)  Right lateral flexion (45) 15*  Left lateral flexion (45) 16*  Right  rotation (85) 42  Left rotation (85) 39  (* = pain; Blank rows = not tested)   MMT MMT (out of 5) Right 11/28/23 Left 11/28/23  Cervical (isometric)  Flexion WNL (numbness reproduced bilat forearms)  Extension WNL  Lateral Flexion WNL WNL  Rotation WNL*(numbness reproduced bilat forearms) WNL*      Shoulder   Flexion 4+ 4+  Extension    Abduction 4+ 4+* ( L shoulder pain)  Internal rotation 4+ 4+  External rotation  4+ 4+  Horizontal abduction    Horizontal adduction    Lower Trapezius    Rhomboids        Elbow  Flexion 5 5  Extension 5 5  Pronation    Supination        Wrist  Flexion 4+ 4+  Extension 5 5  Radial deviation    Ulnar deviation        (* = pain; Blank rows = not tested)  Sensation Grossly intact to light touch bilateral UE as determined by testing dermatomes C2-T2. Proprioception and hot/cold testing deferred on this date.  Reflexes R/L Elbow: 1+/1+  Brachioradialis: 2+/2+  Tricep: 2+/2+ UMN signs: Negative (Hoffman's and Clonus)  Palpation Location LEFT  RIGHT           Suboccipitals 1 1  Cervical paraspinals 1 1  Upper Trapezius 1 (n/t reproduced) 1 (n/t reproduced)  Levator Scapulae    Rhomboid Major/Minor    (Blank rows = not tested) Graded on 0-4 scale (0 = no pain, 1 = pain, 2 = pain with wincing/grimacing/flinching, 3 = pain with withdrawal, 4 = unwilling to allow palpation), (Blank rows = not tested)  Repeated Movements Repeated retraction: Mild pressure in back of head, no worse after  Passive Accessory Intervertebral Motion Significant hypomobility C3-C6, decreased sideglide bilaterally in mid to lower C-spine with pain at restriction   SPECIAL TESTS Spurlings A (ipsilateral lateral flexion/axial compression): R: Positive for neck pain L: Positive for neck pain Spurlings B (ipsilateral lateral flexion/contralateral rotation/axial compression): R: Negative L: Negative Cervical compression: Reproduction of N/T Distraction Test:  No pain, residual numbness that is not resolved  Hoffman Sign (cervical cord compression): R: Negative L: Negative ULTT Median: R: Negative L: Negative ULTT Ulnar: R: Positive for neck pain L: Negative ULTT Radial: R: Negative L: Negative     TODAY'S TREATMENT    SUBJECTIVE STATEMENT:   Patient reports tolerating traction at home better and reports no pain in neutral position. He feels that mobility has improved remarkably with manual intervention. Pt is compliant with HEP.    OBJECTIVE FINDINGS  Cervical Spine AROM Cervical flexion: WNL Cervical extension: Significant motion loss  Cervical spine rotation: R Min motion loss, L Mod motion loss     Manual Therapy - for symptom modulation, soft tissue sensitivity and mobility, joint mobility, ROM   In supine: Passive cervical spine rotation with contralateral anterior facet glide with therapist's digits 2-5 along articular pillar; x 10 for R and L  CPA along C3-4 and C5-7, grade III for mobility; therapist's 1st MCP along articular pillars bilat; 2 x 30 sec bouts  Bilateral cervical sideglides, gr III for improved mobility, C3-6; 3 x 30 sec on each side   -increase in numbess in L>R arm with R to L sideglide  DTM/TPR bilateral upper traps; x 5 minutes  STM/DTM bilateral C4-6 splenius cervicis x 10 minutes     *not today* Cervical traction with pt sidelying L; 10 sec intervals; 2 bouts x 5 minutes Contact-relax technique for R and L rotation; antagonist contraction; x 5 for either direction   -Cervical spine rotation: R 55, L 50  PA mobilization in supine, using PT's 1st MCPs on each articular pillar   -numbness bilaterally increased with CPA at C4-6 Manual median nerve glides with maintenance of scapular depression, LUE; patient performing simultaneous cervical lateral flexion toward side of symptoms with extended position of wrist/elbow  X 20 repetitions    Therapeutic Exercise - for improved soft tissue flexibility and  extensibility as needed for ROM, improved strength as needed to improve performance of CKC activities/functional movements     Cervical SNAG for rotation; 1 x 10 to R and L    Standing row with Blue Tband; 2 x 10, 3 sec hold  -PT tactile cueing for scap retraction/depression   Standing bilateral ER with scap retraction, Green Tband; 2 x 10  -modified tension on band to improve tolerance of posterior cuff strengthening   PATIENT EDUCATION: HEP reviewed. Discussed POC moving forward with anticipated tapering of PT visits as motion deficits and pain are minimized.     *not today* In sitting: muscle energy technique for cervical rotation; antagonist contraction; 5-sec  isometric; x 5 for R and L rotation Standing extension with Blue Tband; 2 x 10, 3 sec hold  -PT tactile cueing for scap retraction/depression  -verbal cueing for eccentric control    Supine, cervical sidebend stretch; 2 x 30 sec, to L and to R   -mod verbal cueing and demo for technique/lateral flexion versus rotation Median nerve flossing; 1x10 on either side Repeated cervical retraction, supine; 1 x 10  -reproduction of bilateral numbness, mildly remaining after  -minor numbness remaining after rest period  Repeated cervical flexion; 1 x 10  -numbness LUE>RUE   -after rest period 1 min, numbness remaining in L arm  Repeated cervical sidebend to L; 1 x 10 Scapular retractions; 1 x 10 and 1x6, 3 sec hold  -decreased reps on second set due to report of L UT pain      PATIENT EDUCATION:  Education details: see above for patient education details Person educated: Patient Education method: Explanation, Demonstration, and Handouts Education comprehension: verbalized understanding and returned demonstration   HOME EXERCISE PROGRAM:  Access Code: [email or web address] URL: [email or web address] Date: 01/04/2024 Prepared by: Denese Finn  Exercises - Seated Self Cervical Traction  - 2 x daily - 7 x weekly  - 2 sets - 10 reps - 5-10sec hold - Supine Cervical Sidebending Stretch  - 2 x daily - 7 x weekly - 3 sets - 10 reps - 20-30sec hold - Median Nerve Flossing - Tray  - 2 x daily - 7 x weekly - 2 sets - 10 reps - 1sec hold - Seated Scapular Retraction  - 2 x daily - 7 x weekly - 2-3 sets - 5-8 reps - 3sec hold - Seated Assisted Cervical Rotation with Towel  - 2 x daily - 7 x weekly - 1-2 sets - 10 reps - Mid-Lower Cervical Extension SNAG with Strap  - 2 x daily - 7 x weekly - 1-2 sets - 10 reps   ASSESSMENT:  CLINICAL IMPRESSION: Patient fortunately has minimal pain and seems to have responded well with ESI and exercises/mobility drills given to date. Patient exhibits WFL cervical extension and R rotation today; L rotation is just shy of functional ROM of 60 deg. Pt is following up with Dr. Lateef again Thursday 01/12/24. Pt has current deficits in C-spine AROM, mid to lower cervical spine stiffness/hypomobility, decreased tolerance to loading bilat deltoid/RTC, and postural changes/upper crossed presentation. Pt will continue to benefit from skilled PT services to address deficits and improve function.   OBJECTIVE IMPAIRMENTS: decreased ROM, decreased strength, hypomobility, impaired flexibility, impaired UE functional use, postural dysfunction, and pain.   ACTIVITY LIMITATIONS: carrying, lifting, bending, sleeping, transfers, bed mobility, and reach over head  PARTICIPATION LIMITATIONS: meal prep, driving, shopping, community activity, golfing, and throwing baseball with son/grandson  PERSONAL FACTORS: Age, Past/current experiences, Time since onset of injury/illness/exacerbation, and 3+ comorbidities: (OA, HLD, pre-diabetes, chronic neck pain with acute flare-up) are also affecting patient's functional outcome.   REHAB POTENTIAL: Good  CLINICAL DECISION MAKING: Unstable/unpredictable  EVALUATION COMPLEXITY: High   GOALS: Goals reviewed with patient? Yes  SHORT TERM GOALS: Target date:  12/19/2023  Pt will be independent with HEP to improve strength and decrease neck pain to improve pain-free function at home and work. Baseline: 11/28/23: Baseline HEP initiated.  Goal status: INITIAL   LONG TERM GOALS: Target date: 01/12/2024  Pt will have cervical spine flexion/extension AROM to 30-35 deg or greater and C-spine rotation AROM to 60 deg or greater bilaterally as needed for completion of  ADLs without functional limitation Baseline: 11/28/23:  Goal status: INITIAL  2.  Pt will decrease worst neck pain by at least 2 points on the NPRS in order to demonstrate clinically significant reduction in neck pain. Baseline: 11/28/23: 10/10 at worst.  Goal status: INITIAL  3.  Pt will decrease NDI score by at least 19% in order demonstrate clinically significant reduction in neck pain/disability.       Baseline: 11/28/23: 10/50 = 20% Goal status: INITIAL  4.  Pt will demonstrate golf swing and overhead throw with normal mechanics and no reproduction of upper quarter pain as needed for participation in golf/throwing baseball with family. Baseline: 11/28/23: Pt currently unable to swing golf club or throw.  Goal status: INITIAL   PLAN: PT FREQUENCY: 1-2x/week  PT DURATION: 6 weeks  PLANNED INTERVENTIONS: Therapeutic exercises, Therapeutic activity, Neuromuscular re-education, Balance training, Gait training, Patient/Family education, Self Care, Joint mobilization, Joint manipulation, Vestibular training, Canalith repositioning, Orthotic/Fit training, DME instructions, Dry Needling, Electrical stimulation, Spinal manipulation, Spinal mobilization, Cryotherapy, Moist heat, Taping, Traction, Ultrasound, Ionotophoresis 4mg /ml Dexamethasone , Manual therapy, and Re-evaluation.  PLAN FOR NEXT SESSION: Traction in sidelying or consider seated as needed for symptom modulation. Utilize manual therapy/mobilization techniques for C-spine mobility and progress with postural re-education drills as  tolerated. Continue with neuromobilization techniques as tolerated.   Denese Finn, PT, DPT #[id number]  Aleatha Hunting, PT 01/11/2024, 3:24 PM

## 2024-01-12 ENCOUNTER — Encounter: Payer: Self-pay | Admitting: Student in an Organized Health Care Education/Training Program

## 2024-01-12 ENCOUNTER — Ambulatory Visit
Attending: Student in an Organized Health Care Education/Training Program | Admitting: Student in an Organized Health Care Education/Training Program

## 2024-01-12 VITALS — BP 115/74 | HR 70 | Temp 98.4°F | Resp 16 | Ht 70.0 in | Wt 178.0 lb

## 2024-01-12 DIAGNOSIS — G894 Chronic pain syndrome: Secondary | ICD-10-CM | POA: Diagnosis present

## 2024-01-12 DIAGNOSIS — M47812 Spondylosis without myelopathy or radiculopathy, cervical region: Secondary | ICD-10-CM

## 2024-01-12 DIAGNOSIS — M5412 Radiculopathy, cervical region: Secondary | ICD-10-CM | POA: Diagnosis not present

## 2024-01-12 NOTE — Progress Notes (Signed)
 Safety precautions to be maintained throughout the outpatient stay will include: orient to surroundings, keep bed in low position, maintain call bell within reach at all times, provide assistance with transfer out of bed and ambulation.

## 2024-01-12 NOTE — Patient Instructions (Signed)

## 2024-01-12 NOTE — Progress Notes (Signed)
 PROVIDER NOTE: Interpretation of information contained herein should be left to medically-trained personnel. Specific patient instructions are provided elsewhere under "Patient Instructions" section of medical record. This document was created in part using AI and STT-dictation technology, any transcriptional errors that may result from this process are unintentional.  Patient: Jeff Mcmahon  Service: E/M   PCP: Clarise Crooks, MD  DOB: 1945-08-20  DOS: 01/12/2024  Provider: Cephus Collin, MD  MRN: [id number]  Delivery: Face-to-face  Specialty: Interventional Pain Management  Type: Established Patient  Setting: Ambulatory outpatient facility  Specialty designation: 09  Referring Prov.: Clarise Crooks, MD  Location: Outpatient office facility       History of present illness (HPI) Jeff Mcmahon, a 78 y.o. year old male, is here today because of his Cervical radicular pain [M54.12]. [name] primary complain today is Neck Pain (Bilateral )  Pertinent problems: [name] does not have any pertinent problems on file.  Pain Assessment: Severity of Chronic pain is reported as a 1 /10. Location: Neck Left, Right/occassionally pain into the base of skull. Onset: More than a month ago. Quality: Other (Comment) (pain only occurs when he gets to the limit of ROM). Timing: Intermittent. Modifying factor(s): CESI has improved the ROM and decreased pain. Vitals:  height is 5\' 10"  (1.778 m) and weight is 178 lb (80.7 kg). His temporal temperature is 98.4 F (36.9 C). His blood pressure is 115/74 and his pulse is 70. His respiration is 16 and oxygen saturation is 98%.  BMI: Estimated body mass index is 25.54 kg/m as calculated from the following:   Height as of this encounter: 5\' 10"  (1.778 m).   Weight as of this encounter: 178 lb (80.7 kg).  Last encounter: 11/08/2023. Last procedure: 12/07/2023.  Reason for encounter: post-procedure evaluation and assessment.   Post-Procedure Evaluation    Procedure: Cervical Epidural Steroid injection (CESI) (Interlaminar) #1  Laterality: Midline  Level: C7-T1 Imaging: Fluoroscopy-assisted DOS: 12/07/2023  Performed by: Cephus Collin, MD Anesthesia: Local anesthesia (1-2% Lidocaine )   Purpose: Diagnostic/Therapeutic Indications: Cervicalgia, cervical radicular pain, degenerative disc disease, severe enough to impact quality of life or function. 1. Cervical radicular pain   2. Chronic pain syndrome    NAS-11 score:   Pre-procedure: 5 /10   Post-procedure: 5 /10     Effectiveness:  Initial hour after procedure: 70 %  Subsequent 4-6 hours post-procedure: 70 %  Analgesia past initial 6 hours: 90 % (ROM greatly improved.  pain is decreased)  Ongoing improvement:  Analgesic:  90% Function: [name] reports improvement in function ROM: [name] reports improvement in ROM    ROS  Constitutional: Denies any fever or chills Gastrointestinal: No reported hemesis, hematochezia, vomiting, or acute GI distress Musculoskeletal: improvement in cervical ROM Neurological: No reported episodes of acute onset apraxia, aphasia, dysarthria, agnosia, amnesia, paralysis, loss of coordination, or loss of consciousness  Medication Review  Muscle Rub, Propylene Glycol, acetaminophen , ezetimibe , famotidine , multivitamin with minerals, psyllium, sodium chloride , and triamcinolone   History Review  Allergy: [name] is allergic to voltaren [diclofenac]. Drug: [name]  reports no history of drug use. Alcohol:  reports current alcohol use. Tobacco:  reports that he has never smoked. He has never used smokeless tobacco. Social: [name]  reports that he has never smoked. He has never used smokeless tobacco. He reports current alcohol use. He reports that he does not use drugs. Medical:  has a past medical history of Arthritis, Dysrhythmia (2007), GERD (gastroesophageal reflux disease), Hyperlipidemia, and  Pre-diabetes. Surgical: Mr.  Betsch  has a past surgical history that includes Mouth surgery; Colonoscopy; Esophagogastroduodenoscopy; Excision mass neck (N/A, 09/07/2016); Excision of back lesion (N/A, 09/07/2016); Colonoscopy with propofol  (N/A, 06/02/2020); Esophagogastroduodenoscopy (egd) with propofol  (N/A, 09/02/2020); and Retinal laser procedure (Right, 04/06/2021). Family: family history includes Alcohol abuse in his father; Cancer in his father; Heart disease in his brother; Hypertension in his mother; Lymphoma (age of onset: 64) in his mother; Mental illness in his father.  Laboratory Chemistry Profile   Renal Lab Results  Component Value Date   BUN 12 08/12/2022   CREATININE 0.87 08/12/2022   BCR 14 08/12/2022   GFRAA 98 05/16/2019   GFRNONAA 84 05/16/2019    Hepatic Lab Results  Component Value Date   AST 20 07/25/2018   ALT 22 07/25/2018   ALBUMIN 4.5 08/12/2022   ALKPHOS 103 07/25/2018    Electrolytes Lab Results  Component Value Date   NA 142 08/12/2022   K 4.4 08/12/2022   CL 101 08/12/2022   CALCIUM  9.2 08/12/2022   PHOS 3.5 08/12/2022    Bone No results found for: "VD25OH", "VD125OH2TOT", "ZO1096EA5", "WU9811BJ4", "25OHVITD1", "25OHVITD2", "25OHVITD3", "TESTOFREE", "TESTOSTERONE"  Inflammation (CRP: Acute Phase) (ESR: Chronic Phase) No results found for: "CRP", "ESRSEDRATE", "LATICACIDVEN"       Note: Above Lab results reviewed.    Physical Exam  General appearance: Well nourished, well developed, and well hydrated. In no apparent acute distress Mental status: Alert, oriented x 3 (person, place, & time)       Respiratory: No evidence of acute respiratory distress Eyes: PERLA Vitals: BP 115/74 (BP Location: Left Arm, Patient Position: Sitting, Cuff Size: Normal)   Pulse 70   Temp 98.4 F (36.9 C) (Temporal)   Resp 16   Ht 5\' 10"  (1.778 m)   Wt 178 lb (80.7 kg)   SpO2 98%   BMI 25.54 kg/m  BMI: Estimated body mass index is 25.54 kg/m as calculated from the following:    Height as of this encounter: 5\' 10"  (1.778 m).   Weight as of this encounter: 178 lb (80.7 kg). Ideal: Ideal body weight: 73 kg (160 lb 15 oz) Adjusted ideal body weight: 76.1 kg (167 lb 12.2 oz)  Significant improvement in cervical pain and ROM  Assessment   Diagnosis Status  1. Cervical radicular pain   2. Cervical spondylosis   3. Chronic pain syndrome    Controlled Controlled Controlled   Updated Problems: No problems updated.  Plan of Care   Excellent response after C-ESI. Continues to work with PT Follow up PRN   C7/T1 Esi 12/07/23-90%    No follow-ups on file.    Recent Visits Date Type Provider Dept  12/07/23 Procedure visit Cephus Collin, MD [facility]  11/08/23 Office Visit Cephus Collin, MD [facility]  Showing recent visits within past 90 days and meeting all other requirements Today's Visits Date Type Provider Dept  01/12/24 Office Visit Cephus Collin, MD [facility]  Showing today's visits and meeting all other requirements Future Appointments No visits were found meeting these conditions. Showing future appointments within next 90 days and meeting all other requirements  I discussed the assessment and treatment plan with the patient. The patient was provided an opportunity to ask questions and all were answered. The patient agreed with the plan and demonstrated an understanding of the instructions.  Patient advised to call back or seek an in-person evaluation if the symptoms or condition worsens.  Duration of encounter: .  Total time on encounter, as per AMA guidelines included both the face-to-face and non-face-to-face time personally spent by the physician and/or other qualified health care professional(s) on the day of the encounter (includes time in activities that require the physician or other qualified health care professional and does not include time in activities normally performed by clinical staff).  Physician's time may include the following activities when performed: Preparing to see the patient (e.g., pre-charting review of records, searching for previously ordered imaging, lab work, and nerve conduction tests) Review of prior analgesic pharmacotherapies. Reviewing PMP Interpreting ordered tests (e.g., lab work, imaging, nerve conduction tests) Performing post-procedure evaluations, including interpretation of diagnostic procedures Obtaining and/or reviewing separately obtained history Performing a medically appropriate examination and/or evaluation Counseling and educating the patient/family/caregiver Ordering medications, tests, or procedures Referring and communicating with other health care professionals (when not separately reported) Documenting clinical information in the electronic or other health record Independently interpreting results (not separately reported) and communicating results to the patient/ family/caregiver Care coordination (not separately reported)  Note by: Cephus Collin, MD (TTS and AI technology used. I apologize for any typographical errors that were not detected and corrected.) Date: 01/12/2024; Time: 11:41 AM

## 2024-01-16 ENCOUNTER — Encounter: Payer: Self-pay | Admitting: Physical Therapy

## 2024-01-16 ENCOUNTER — Ambulatory Visit: Admitting: Physical Therapy

## 2024-01-16 DIAGNOSIS — M79601 Pain in right arm: Secondary | ICD-10-CM

## 2024-01-16 DIAGNOSIS — M25522 Pain in left elbow: Secondary | ICD-10-CM

## 2024-01-16 DIAGNOSIS — M6281 Muscle weakness (generalized): Secondary | ICD-10-CM | POA: Diagnosis not present

## 2024-01-16 DIAGNOSIS — M542 Cervicalgia: Secondary | ICD-10-CM | POA: Diagnosis not present

## 2024-01-16 NOTE — Therapy (Unsigned)
 OUTPATIENT PHYSICAL THERAPY NECK TREATMENT   Patient Name: Jeff Mcmahon MRN: [id number] DOB:1946/07/03, 78 y.o., male Today's Date: 01/16/2024  END OF SESSION:  PT End of Session - 01/16/24 1330     Visit Number 9    Number of Visits 13    Date for PT Re-Evaluation 01/12/24    Authorization Type Medicare A&B 2025    PT Start Time 1331    PT Stop Time 1411    PT Time Calculation (min) 40 min    Activity Tolerance Patient tolerated treatment well    Behavior During Therapy WFL for tasks assessed/performed                  Past Medical History:  Diagnosis Date   Arthritis    Dysrhythmia 2007   H/O IRREGULAR HEART BEAT-SAW CARDIOLOGIST AND SAID IT WAS NOTHING TO BE CONCERNED WITH   GERD (gastroesophageal reflux disease)    H/O   Hyperlipidemia    Pre-diabetes    Past Surgical History:  Procedure Laterality Date   COLONOSCOPY     COLONOSCOPY WITH PROPOFOL  N/A 06/02/2020   Procedure: COLONOSCOPY WITH PROPOFOL ;  Surgeon: Selena Daily, MD;  Location: [facility];  Service: Gastroenterology;  Laterality: N/A;   ESOPHAGOGASTRODUODENOSCOPY     ESOPHAGOGASTRODUODENOSCOPY (EGD) WITH PROPOFOL  N/A 09/02/2020   Procedure: ESOPHAGOGASTRODUODENOSCOPY (EGD) WITH PROPOFOL ;  Surgeon: Selena Daily, MD;  Location: [facility];  Service: Gastroenterology;  Laterality: N/A;   EXCISION MASS NECK N/A 09/07/2016   Procedure: EXCISION MASS NECK;  Surgeon: Claudia Cuff, MD;  Location: [facility];  Service: General;  Laterality: N/A;   EXCISION OF BACK LESION N/A 09/07/2016   Procedure: EXCISION OF BACK LESION;  Surgeon: Claudia Cuff, MD;  Location: [facility];  Service: General;  Laterality: N/A;   MOUTH SURGERY     dental procedure   RETINAL LASER PROCEDURE Right 04/06/2021   [facility] in [place]   Patient Active Problem List   Diagnosis Date Noted   Cervical radicular pain 11/08/2023   Chronic pain syndrome 11/08/2023   Retinal tear of  right eye 12/21/2021   Dysphagia    Cervical spondylosis 10/02/2015   Prediabetes 09/09/2015   History of colonic polyps 09/05/2015   Hyperlipidemia 09/04/2015   GERD (gastroesophageal reflux disease) 09/04/2015   Dry eyes 09/04/2015    PCP: Clarise Crooks, MD (Inactive)  REFERRING PROVIDER: Cephus Collin, MD  REFERRING DIAG:  M54.12 (ICD-10-CM) - Cervical radicular pain  M47.812 (ICD-10-CM) - Cervical spondylosis  G89.4 (ICD-10-CM) - Chronic pain syndrome  M54.2 (ICD-10-CM) - Cervicalgia    RATIONALE FOR EVALUATION AND TREATMENT: Rehabilitation  THERAPY DIAG: Cervicalgia  Pain in right arm  Muscle weakness (generalized)  Pain in left elbow  ONSET DATE: Worsening pain since Feb 2024, chronic issue prior to that which was under control  FOLLOW-UP APPT SCHEDULED WITH REFERRING PROVIDER: Yes ; 12/07/23   PERTINENT HISTORY:  Pt is a 78 year old male referred for R-sided neck pain with R upper limb referral with atraumatic onset. Med hx including OA, GERD, HLD, pre-diabetes. MRI results have yet to be obtained from radiologist. Hx of C-spine X-rays with evidence of moderate degenerative changes and loss of lordosis.   Pt reports long-term problem that has gotten worse over last couple of months. He reports minor pain and motion deficits for approx 40 years. In Nov 2023, pt got a lot more stiffness in C-spine; R-sided neck pain primarily. Symptoms improved with Rx medication given by Dr. Augustus Ledger  in the fall. Pt reports in February, symptoms got much worse, up to 10/10. Prednisone  resolved pain completely, but pain came back in a couple of days. Pt reports notable pain with turning his head. Pt will consider ESI for 12/07/23. "Uneasiness" in neck at rest along posterior cervical spine. Symptoms are variable. Pt reports more issues with getting to sleep and with turning his head to change positions. Pt reports he can have posterior C-spine pain with wide jaw opening. No recent nausea,  vomiting, vertigo, dizziness, visual changes.    Pain:  Pain Intensity: Present: 2/10, Best: 1/10, Worst: 10/10 Pain location: Posterior C-spine and occipital region Pain Quality: sharp  Radiating: No  Numbness/Tingling: Yes; numbness in arms with cervical extension, down to bilat forearms  Focal Weakness: No Aggravating factors: turning R more than L, lying flat in bed, turning over in bed, looking overhead Relieving factors: keeping head in pain-free range, sitting with good head support, limiting ROM, heat, Bengay 24-hour pain behavior: worse at night time   History of prior neck injury, pain, surgery, or therapy: Yes;  Hx of RTC-related pain in both shoulders Dominant hand: left  Imaging: Yes ; *Cervical spine X-rays 1. Moderate degenerative changes. No evidence for acute abnormality. 2. Loss of cervical lordosis.  *Cervical spine MRI completed 11/11/23 - awaiting release of radiologist report   Red flags (personal history of cancer, h/o spinal tumors, history of compression fracture, chills/fever, night sweats, nausea, vomiting, unrelenting pain): Negative   PRECAUTIONS: None  WEIGHT BEARING RESTRICTIONS: No  FALLS: Has patient fallen in last 6 months? No  Living Environment Lives with: lives with their spouse; son lives 20 minutes away  Lives in: House/apartment   Prior level of function: Independent  Occupational demands: Retired  Hobbies: Multimedia programmer, able to throw baseball with son/grandkids  Patient Goals: Able to resolve pain     OBJECTIVE (data from initial evaluation unless otherwise dated):    Patient Surveys  NDI 10/50 = 20%  Posture Increased thoracic kyphosis, Mild Dowager's hump, forward head. Resting in posterior pelvic tilt/kyphotic posture in sitting.   AROM AROM (Normal range in degrees) AROM 11/28/23  Cervical  Flexion (50) 25  Extension (80) 13* (Muscles "grab" back of neck)  Right lateral flexion (45) 15*  Left lateral flexion (45)  16*  Right rotation (85) 42  Left rotation (85) 39  (* = pain; Blank rows = not tested)   MMT MMT (out of 5) Right 11/28/23 Left 11/28/23  Cervical (isometric)  Flexion WNL (numbness reproduced bilat forearms)  Extension WNL  Lateral Flexion WNL WNL  Rotation WNL*(numbness reproduced bilat forearms) WNL*      Shoulder   Flexion 4+ 4+  Extension    Abduction 4+ 4+* ( L shoulder pain)  Internal rotation 4+ 4+  External rotation  4+ 4+  Horizontal abduction    Horizontal adduction    Lower Trapezius    Rhomboids        Elbow  Flexion 5 5  Extension 5 5  Pronation    Supination        Wrist  Flexion 4+ 4+  Extension 5 5  Radial deviation    Ulnar deviation        (* = pain; Blank rows = not tested)  Sensation Grossly intact to light touch bilateral UE as determined by testing dermatomes C2-T2. Proprioception and hot/cold testing deferred on this date.  Reflexes R/L Elbow: 1+/1+  Brachioradialis: 2+/2+  Tricep: 2+/2+ UMN signs: Negative (Hoffman's and  Clonus)  Palpation Location LEFT  RIGHT           Suboccipitals 1 1  Cervical paraspinals 1 1  Upper Trapezius 1 (n/t reproduced) 1 (n/t reproduced)  Levator Scapulae    Rhomboid Major/Minor    (Blank rows = not tested) Graded on 0-4 scale (0 = no pain, 1 = pain, 2 = pain with wincing/grimacing/flinching, 3 = pain with withdrawal, 4 = unwilling to allow palpation), (Blank rows = not tested)  Repeated Movements Repeated retraction: Mild pressure in back of head, no worse after  Passive Accessory Intervertebral Motion Significant hypomobility C3-C6, decreased sideglide bilaterally in mid to lower C-spine with pain at restriction   SPECIAL TESTS Spurlings A (ipsilateral lateral flexion/axial compression): R: Positive for neck pain L: Positive for neck pain Spurlings B (ipsilateral lateral flexion/contralateral rotation/axial compression): R: Negative L: Negative Cervical compression: Reproduction of  N/T Distraction Test: No pain, residual numbness that is not resolved  Hoffman Sign (cervical cord compression): R: Negative L: Negative ULTT Median: R: Negative L: Negative ULTT Ulnar: R: Positive for neck pain L: Negative ULTT Radial: R: Negative L: Negative     TODAY'S TREATMENT    SUBJECTIVE STATEMENT:   Patient reports having good f/u with Dr. Rhesa Celeste and he was cleared at the time for return to activity "cautiously." Pt reports no pain at rest; pain with end-ROM. Patient reports    OBJECTIVE FINDINGS  Cervical Spine AROM Cervical flexion: WNL Cervical extension: 20 Cervical spine rotation: R 58, L 57    Manual Therapy - for symptom modulation, soft tissue sensitivity and mobility, joint mobility, ROM   In supine: Passive cervical spine rotation with contralateral anterior facet glide with therapist's digits 2-5 along articular pillar; x 10 for R and L  CPA along C3-4 and C5-7, grade III for mobility; therapist's 1st MCP along articular pillars bilat; 2 x 30 sec bouts  Bilateral cervical sideglides, gr III for improved mobility, C3-6; 3 x 30 sec on each side  DTM/TPR bilateral upper traps; x 5 minutes  STM/DTM bilateral C4-6 splenius cervicis x 10 minutes     *not today* Cervical traction with pt sidelying L; 10 sec intervals; 2 bouts x 5 minutes Contact-relax technique for R and L rotation; antagonist contraction; x 5 for either direction   -Cervical spine rotation: R 55, L 50  PA mobilization in supine, using PT's 1st MCPs on each articular pillar   -numbness bilaterally increased with CPA at C4-6 Manual median nerve glides with maintenance of scapular depression, LUE; patient performing simultaneous cervical lateral flexion toward side of symptoms with extended position of wrist/elbow  X 20 repetitions    Therapeutic Exercise - for improved soft tissue flexibility and extensibility as needed for ROM, improved strength as needed to improve performance of CKC  activities/functional movements     Open book; 1 x 10  Cervical SNAG for rotation; 1 x 10 to R and L    Standing row with Blue Tband; 2 x 10, 3 sec hold  -PT tactile cueing for scap retraction/depression     PATIENT EDUCATION: HEP reviewed. Discussed POC moving forward with anticipated tapering of PT visits as motion deficits and pain are minimized.     *not today* Standing bilateral ER with scap retraction, Green Tband; 2 x 10  -modified tension on band to improve tolerance of posterior cuff strengthening In sitting: muscle energy technique for cervical rotation; antagonist contraction; 5-sec isometric; x 5 for R and L rotation Standing extension with  Blue Tband; 2 x 10, 3 sec hold  -PT tactile cueing for scap retraction/depression  -verbal cueing for eccentric control    Supine, cervical sidebend stretch; 2 x 30 sec, to L and to R   -mod verbal cueing and demo for technique/lateral flexion versus rotation Median nerve flossing; 1x10 on either side Repeated cervical retraction, supine; 1 x 10  -reproduction of bilateral numbness, mildly remaining after  -minor numbness remaining after rest period  Repeated cervical flexion; 1 x 10  -numbness LUE>RUE   -after rest period 1 min, numbness remaining in L arm  Repeated cervical sidebend to L; 1 x 10 Scapular retractions; 1 x 10 and 1x6, 3 sec hold  -decreased reps on second set due to report of L UT pain      PATIENT EDUCATION:  Education details: see above for patient education details Person educated: Patient Education method: Explanation, Demonstration, and Handouts Education comprehension: verbalized understanding and returned demonstration   HOME EXERCISE PROGRAM:  Access Code: [email or web address] URL: [email or web address] Date: 01/16/2024 Prepared by: Denese Finn  Exercises - Seated Self Cervical Traction  - 2 x daily - 7 x weekly - 2 sets - 10 reps - 5-10sec hold - Supine Cervical Sidebending Stretch   - 2 x daily - 7 x weekly - 3 sets - 10 reps - 20-30sec hold - Median Nerve Flossing - Tray  - 2 x daily - 7 x weekly - 2 sets - 10 reps - 1sec hold - Seated Assisted Cervical Rotation with Towel  - 2 x daily - 7 x weekly - 1-2 sets - 10 reps - Mid-Lower Cervical Extension SNAG with Strap  - 2 x daily - 7 x weekly - 1-2 sets - 10 reps - Sidelying Thoracic Rotation with Open Book  - 2 x daily - 7 x weekly - 2 sets - 10 reps - 2sec hold   ASSESSMENT:  CLINICAL IMPRESSION: Patient had good f/u with Dr. Rhesa Celeste. No further injection/medical intervention was indicated. Pt exhibits similar deficits in C-spine AROM compared to last visit, with extension and bilateral cervical spine rotation on cusp of functional ROM. Baseline extension is markedly limited at arrival today, but he is able to attain 25 deg extension after manual therapy/stretching. Pt has current deficits in C-spine AROM, mid to lower cervical spine stiffness/hypomobility, decreased tolerance to loading bilat deltoid/RTC, and postural changes/upper crossed presentation. Pt will continue to benefit from skilled PT services to address deficits and improve function.   OBJECTIVE IMPAIRMENTS: decreased ROM, decreased strength, hypomobility, impaired flexibility, impaired UE functional use, postural dysfunction, and pain.   ACTIVITY LIMITATIONS: carrying, lifting, bending, sleeping, transfers, bed mobility, and reach over head  PARTICIPATION LIMITATIONS: meal prep, driving, shopping, community activity, golfing, and throwing baseball with son/grandson  PERSONAL FACTORS: Age, Past/current experiences, Time since onset of injury/illness/exacerbation, and 3+ comorbidities: (OA, HLD, pre-diabetes, chronic neck pain with acute flare-up) are also affecting patient's functional outcome.   REHAB POTENTIAL: Good  CLINICAL DECISION MAKING: Unstable/unpredictable  EVALUATION COMPLEXITY: High   GOALS: Goals reviewed with patient? Yes  SHORT TERM  GOALS: Target date: 12/19/2023  Pt will be independent with HEP to improve strength and decrease neck pain to improve pain-free function at home and work. Baseline: 11/28/23: Baseline HEP initiated.  Goal status: INITIAL   LONG TERM GOALS: Target date: 01/12/2024  Pt will have cervical spine flexion/extension AROM to 30-35 deg or greater and C-spine rotation AROM to 60 deg or greater bilaterally as needed for completion of ADLs  without functional limitation Baseline: 11/28/23:  Goal status: INITIAL  2.  Pt will decrease worst neck pain by at least 2 points on the NPRS in order to demonstrate clinically significant reduction in neck pain. Baseline: 11/28/23: 10/10 at worst.  Goal status: INITIAL  3.  Pt will decrease NDI score by at least 19% in order demonstrate clinically significant reduction in neck pain/disability.       Baseline: 11/28/23: 10/50 = 20% Goal status: INITIAL  4.  Pt will demonstrate golf swing and overhead throw with normal mechanics and no reproduction of upper quarter pain as needed for participation in golf/throwing baseball with family. Baseline: 11/28/23: Pt currently unable to swing golf club or throw.  Goal status: INITIAL   PLAN: PT FREQUENCY: 1-2x/week  PT DURATION: 6 weeks  PLANNED INTERVENTIONS: Therapeutic exercises, Therapeutic activity, Neuromuscular re-education, Balance training, Gait training, Patient/Family education, Self Care, Joint mobilization, Joint manipulation, Vestibular training, Canalith repositioning, Orthotic/Fit training, DME instructions, Dry Needling, Electrical stimulation, Spinal manipulation, Spinal mobilization, Cryotherapy, Moist heat, Taping, Traction, Ultrasound, Ionotophoresis 4mg /ml Dexamethasone , Manual therapy, and Re-evaluation.  PLAN FOR NEXT SESSION: Traction in sidelying or consider seated as needed for symptom modulation. Utilize manual therapy/mobilization techniques for C-spine mobility and progress with postural  re-education drills as tolerated. Continue with neuromobilization techniques as tolerated.   Denese Finn, PT, DPT #[id number]  Aleatha Hunting, PT 01/16/2024, 1:31 PM

## 2024-01-18 ENCOUNTER — Ambulatory Visit: Admitting: Physical Therapy

## 2024-01-18 DIAGNOSIS — M6281 Muscle weakness (generalized): Secondary | ICD-10-CM | POA: Diagnosis not present

## 2024-01-18 DIAGNOSIS — M25522 Pain in left elbow: Secondary | ICD-10-CM | POA: Diagnosis not present

## 2024-01-18 DIAGNOSIS — M79601 Pain in right arm: Secondary | ICD-10-CM

## 2024-01-18 DIAGNOSIS — M542 Cervicalgia: Secondary | ICD-10-CM | POA: Diagnosis not present

## 2024-01-18 NOTE — Therapy (Signed)
 OUTPATIENT PHYSICAL THERAPY TREATMENT AND PROGRESS NOTE/RE-CERTIFICATION   Dates of reporting period  11/28/23   to   01/18/24   Patient Name: Jeff Mcmahon MRN: [id number] DOB:02-Mar-1946, 78 y.o., male Today's Date: 01/18/2024  END OF SESSION:  PT End of Session - 01/18/24 1322     Visit Number 10    Number of Visits 13    Date for PT Re-Evaluation 01/12/24    Authorization Type Medicare A&B 2025    PT Start Time 1330    PT Stop Time 1410    PT Time Calculation (min) 40 min    Activity Tolerance Patient tolerated treatment well    Behavior During Therapy WFL for tasks assessed/performed              Past Medical History:  Diagnosis Date   Arthritis    Dysrhythmia 2007   H/O IRREGULAR HEART BEAT-SAW CARDIOLOGIST AND SAID IT WAS NOTHING TO BE CONCERNED WITH   GERD (gastroesophageal reflux disease)    H/O   Hyperlipidemia    Pre-diabetes    Past Surgical History:  Procedure Laterality Date   COLONOSCOPY     COLONOSCOPY WITH PROPOFOL  N/A 06/02/2020   Procedure: COLONOSCOPY WITH PROPOFOL ;  Surgeon: Selena Daily, MD;  Location: [facility];  Service: Gastroenterology;  Laterality: N/A;   ESOPHAGOGASTRODUODENOSCOPY     ESOPHAGOGASTRODUODENOSCOPY (EGD) WITH PROPOFOL  N/A 09/02/2020   Procedure: ESOPHAGOGASTRODUODENOSCOPY (EGD) WITH PROPOFOL ;  Surgeon: Selena Daily, MD;  Location: [facility];  Service: Gastroenterology;  Laterality: N/A;   EXCISION MASS NECK N/A 09/07/2016   Procedure: EXCISION MASS NECK;  Surgeon: Claudia Cuff, MD;  Location: [facility];  Service: General;  Laterality: N/A;   EXCISION OF BACK LESION N/A 09/07/2016   Procedure: EXCISION OF BACK LESION;  Surgeon: Claudia Cuff, MD;  Location: [facility];  Service: General;  Laterality: N/A;   MOUTH SURGERY     dental procedure   RETINAL LASER PROCEDURE Right 04/06/2021   [facility] in [place]   Patient Active Problem List   Diagnosis Date Noted   Cervical  radicular pain 11/08/2023   Chronic pain syndrome 11/08/2023   Retinal tear of right eye 12/21/2021   Dysphagia    Cervical spondylosis 10/02/2015   Prediabetes 09/09/2015   History of colonic polyps 09/05/2015   Hyperlipidemia 09/04/2015   GERD (gastroesophageal reflux disease) 09/04/2015   Dry eyes 09/04/2015    PCP: Clarise Crooks, MD (Inactive)  REFERRING PROVIDER: Cephus Collin, MD  REFERRING DIAG:  M54.12 (ICD-10-CM) - Cervical radicular pain  M47.812 (ICD-10-CM) - Cervical spondylosis  G89.4 (ICD-10-CM) - Chronic pain syndrome  M54.2 (ICD-10-CM) - Cervicalgia    RATIONALE FOR EVALUATION AND TREATMENT: Rehabilitation  THERAPY DIAG: Cervicalgia  Pain in right arm  Muscle weakness (generalized)  ONSET DATE: Worsening pain since Feb 2024, chronic issue prior to that which was under control  FOLLOW-UP APPT SCHEDULED WITH REFERRING PROVIDER: Yes ; 12/07/23   PERTINENT HISTORY:  Pt is a 78 year old male referred for R-sided neck pain with R upper limb referral with atraumatic onset. Med hx including OA, GERD, HLD, pre-diabetes. MRI results have yet to be obtained from radiologist. Hx of C-spine X-rays with evidence of moderate degenerative changes and loss of lordosis.   Pt reports long-term problem that has gotten worse over last couple of months. He reports minor pain and motion deficits for approx 40 years. In Nov 2023, pt got a lot more stiffness in C-spine; R-sided neck pain primarily. Symptoms improved  with Rx medication given by Dr. Augustus Ledger in the fall. Pt reports in February, symptoms got much worse, up to 10/10. Prednisone  resolved pain completely, but pain came back in a couple of days. Pt reports notable pain with turning his head. Pt will consider ESI for 12/07/23. Uneasiness in neck at rest along posterior cervical spine. Symptoms are variable. Pt reports more issues with getting to sleep and with turning his head to change positions. Pt reports he can have  posterior C-spine pain with wide jaw opening. No recent nausea, vomiting, vertigo, dizziness, visual changes.    Pain:  Pain Intensity: Present: 2/10, Best: 1/10, Worst: 10/10 Pain location: Posterior C-spine and occipital region Pain Quality: sharp  Radiating: No  Numbness/Tingling: Yes; numbness in arms with cervical extension, down to bilat forearms  Focal Weakness: No Aggravating factors: turning R more than L, lying flat in bed, turning over in bed, looking overhead Relieving factors: keeping head in pain-free range, sitting with good head support, limiting ROM, heat, Bengay 24-hour pain behavior: worse at night time   History of prior neck injury, pain, surgery, or therapy: Yes;  Hx of RTC-related pain in both shoulders Dominant hand: left  Imaging: Yes ; *Cervical spine X-rays 1. Moderate degenerative changes. No evidence for acute abnormality. 2. Loss of cervical lordosis.  *Cervical spine MRI completed 11/11/23 - awaiting release of radiologist report   Red flags (personal history of cancer, h/o spinal tumors, history of compression fracture, chills/fever, night sweats, nausea, vomiting, unrelenting pain): Negative   PRECAUTIONS: None  WEIGHT BEARING RESTRICTIONS: No  FALLS: Has patient fallen in last 6 months? No  Living Environment Lives with: lives with their spouse; son lives 20 minutes away  Lives in: House/apartment   Prior level of function: Independent  Occupational demands: Retired  Hobbies: Multimedia programmer, able to throw baseball with son/grandkids  Patient Goals: Able to resolve pain     OBJECTIVE (data from initial evaluation unless otherwise dated):    Patient Surveys  NDI 10/50 = 20%  Posture Increased thoracic kyphosis, Mild Dowager's hump, forward head. Resting in posterior pelvic tilt/kyphotic posture in sitting.   AROM AROM (Normal range in degrees) AROM 11/28/23 AROM 01/18/24  Cervical   Flexion (50) 25 55  Extension (80) 13* (Muscles  grab back of neck) 25  Right lateral flexion (45) 15* 25  Left lateral flexion (45) 16* 12  Right rotation (85) 42 62  Left rotation (85) 39 55  (* = pain; Blank rows = not tested)   MMT MMT (out of 5) Right 11/28/23 Left 11/28/23  Cervical (isometric)  Flexion WNL (numbness reproduced bilat forearms)  Extension WNL  Lateral Flexion WNL WNL  Rotation WNL*(numbness reproduced bilat forearms) WNL*      Shoulder   Flexion 4+ 4+  Extension    Abduction 4+ 4+* ( L shoulder pain)  Internal rotation 4+ 4+  External rotation  4+ 4+  Horizontal abduction    Horizontal adduction    Lower Trapezius    Rhomboids        Elbow  Flexion 5 5  Extension 5 5  Pronation    Supination        Wrist  Flexion 4+ 4+  Extension 5 5  Radial deviation    Ulnar deviation        (* = pain; Blank rows = not tested)  Sensation Grossly intact to light touch bilateral UE as determined by testing dermatomes C2-T2. Proprioception and hot/cold testing deferred on this date.  Reflexes R/L Elbow: 1+/1+  Brachioradialis: 2+/2+  Tricep: 2+/2+ UMN signs: Negative (Hoffman's and Clonus)  Palpation Location LEFT  RIGHT           Suboccipitals 1 1  Cervical paraspinals 1 1  Upper Trapezius 1 (n/t reproduced) 1 (n/t reproduced)  Levator Scapulae    Rhomboid Major/Minor    (Blank rows = not tested) Graded on 0-4 scale (0 = no pain, 1 = pain, 2 = pain with wincing/grimacing/flinching, 3 = pain with withdrawal, 4 = unwilling to allow palpation), (Blank rows = not tested)  Repeated Movements Repeated retraction: Mild pressure in back of head, no worse after  Passive Accessory Intervertebral Motion Significant hypomobility C3-C6, decreased sideglide bilaterally in mid to lower C-spine with pain at restriction   SPECIAL TESTS Spurlings A (ipsilateral lateral flexion/axial compression): R: Positive for neck pain L: Positive for neck pain Spurlings B (ipsilateral lateral flexion/contralateral  rotation/axial compression): R: Negative L: Negative Cervical compression: Reproduction of N/T Distraction Test: No pain, residual numbness that is not resolved  Hoffman Sign (cervical cord compression): R: Negative L: Negative ULTT Median: R: Negative L: Negative ULTT Ulnar: R: Positive for neck pain L: Negative ULTT Radial: R: Negative L: Negative     TODAY'S TREATMENT    SUBJECTIVE STATEMENT:   Patient reports significant discomfort in L shoulder after open book exercise. Patient reports some suboccipital tightness primarily with open book/bow and arrow thoracic rotation exercises. Patient reports no notable pain at arrival to office today. He reports some discomfort with full cervical spine rotation. Patient reports notable progress with ROM.    *GOAL UPDATE PERFORMED   Manual Therapy - for symptom modulation, soft tissue sensitivity and mobility, joint mobility, ROM   In supine: Manual traction, general in supine; x 3 minutes, 10-sec intermittent holds Manual traction with conjunct L rotation with oscillations to movement barrier for 1-sec; x 10 reps  Bilateral cervical sideglides, gr III for improved mobility, C3-6; 3 x 30 sec on each side    STM/DTM bilateral C4-6 splenius cervicis x 10 minutes DTM/TPR bilateral upper traps; x 5 minutes   Passive cervical spine rotation with contralateral anterior facet glide with therapist's digits 2-5 along articular pillar; x 10 for R and L     *not today* CPA along C3-4 and C5-7, grade III for mobility; therapist's 1st MCP along articular pillars bilat; 2 x 30 sec bouts Cervical traction with pt sidelying L; 10 sec intervals; 2 bouts x 5 minutes Contact-relax technique for R and L rotation; antagonist contraction; x 5 for either direction   -Cervical spine rotation: R 55, L 50  PA mobilization in supine, using PT's 1st MCPs on each articular pillar   -numbness bilaterally increased with CPA at C4-6 Manual median nerve glides with  maintenance of scapular depression, LUE; patient performing simultaneous cervical lateral flexion toward side of symptoms with extended position of wrist/elbow  X 20 repetitions    Therapeutic Exercise - for improved soft tissue flexibility and extensibility as needed for ROM, improved strength as needed to improve performance of CKC activities/functional movements     Bow and arrow; reviewed   PATIENT EDUCATION: HEP reviewed. Discussed tapered POC and prognosis.     *next visit*  Cervical SNAG for rotation; 1 x 10 to R and L  Standing row with Blue Tband; 2 x 10, 3 sec hold  -PT tactile cueing for scap retraction/depression   *not today* Standing bilateral ER with scap retraction, Green Tband; 2 x 10  -  modified tension on band to improve tolerance of posterior cuff strengthening In sitting: muscle energy technique for cervical rotation; antagonist contraction; 5-sec isometric; x 5 for R and L rotation Standing extension with Blue Tband; 2 x 10, 3 sec hold  -PT tactile cueing for scap retraction/depression  -verbal cueing for eccentric control    Supine, cervical sidebend stretch; 2 x 30 sec, to L and to R   -mod verbal cueing and demo for technique/lateral flexion versus rotation Median nerve flossing; 1x10 on either side Repeated cervical retraction, supine; 1 x 10  -reproduction of bilateral numbness, mildly remaining after  -minor numbness remaining after rest period  Repeated cervical flexion; 1 x 10  -numbness LUE>RUE   -after rest period 1 min, numbness remaining in L arm  Repeated cervical sidebend to L; 1 x 10 Scapular retractions; 1 x 10 and 1x6, 3 sec hold  -decreased reps on second set due to report of L UT pain      PATIENT EDUCATION:  Education details: see above for patient education details Person educated: Patient Education method: Explanation, Demonstration, and Handouts Education comprehension: verbalized understanding and returned  demonstration   HOME EXERCISE PROGRAM:  Access Code: [email or web address] URL: [email or web address] Date: 01/16/2024 Prepared by: Denese Finn  Exercises - Seated Self Cervical Traction  - 2 x daily - 7 x weekly - 2 sets - 10 reps - 5-10sec hold - Supine Cervical Sidebending Stretch  - 2 x daily - 7 x weekly - 3 sets - 10 reps - 20-30sec hold - Median Nerve Flossing - Tray  - 2 x daily - 7 x weekly - 2 sets - 10 reps - 1sec hold - Seated Assisted Cervical Rotation with Towel  - 2 x daily - 7 x weekly - 1-2 sets - 10 reps - Mid-Lower Cervical Extension SNAG with Strap  - 2 x daily - 7 x weekly - 1-2 sets - 10 reps - Sidelying Thoracic Rotation with Open Book  - 2 x daily - 7 x weekly - 2 sets - 10 reps - 2sec hold   ASSESSMENT:  CLINICAL IMPRESSION: Patient fortunately has markedly improved paresthesias and localized posterior C-spine pain after ESI. With PT as well, pt has been able to access significantly higher degree of ROM. He reports subjectively feeling notable improvement with cervical spine stiffness/mobility. Pt did have notable L shoulder discomfort with trial of open book; we discussed modification to use at home (bow and arrow) for thoracic rotation without end-range horizontal abduction. Pt has clinically significant reduction in pain rating scale and markedly improved NDI, though NDI has not reached MCID due to relatively low disability score at baseline. Pt has made good progress and is ready to taper PT frequency and gradually work toward IND HEP. Pt has current deficits in C-spine AROM, mid to lower cervical spine stiffness/hypomobility, decreased tolerance to loading bilat deltoid/RTC, and postural changes/upper crossed presentation. Pt will continue to benefit from skilled PT services to address deficits and improve function.   OBJECTIVE IMPAIRMENTS: decreased ROM, decreased strength, hypomobility, impaired flexibility, impaired UE functional use, postural  dysfunction, and pain.   ACTIVITY LIMITATIONS: carrying, lifting, bending, sleeping, transfers, bed mobility, and reach over head  PARTICIPATION LIMITATIONS: meal prep, driving, shopping, community activity, golfing, and throwing baseball with son/grandson  PERSONAL FACTORS: Age, Past/current experiences, Time since onset of injury/illness/exacerbation, and 3+ comorbidities: (OA, HLD, pre-diabetes, chronic neck pain with acute flare-up) are also affecting patient's functional outcome.   REHAB POTENTIAL: Good  CLINICAL DECISION  MAKING: Unstable/unpredictable  EVALUATION COMPLEXITY: High   GOALS: Goals reviewed with patient? Yes  SHORT TERM GOALS: Target date: 12/19/2023  Pt will be independent with HEP to improve strength and decrease neck pain to improve pain-free function at home and work. Baseline: 11/28/23: Baseline HEP initiated.    01/18/24: Pt verbalizes sound understanding of exercises and has largely maintained compliance.  Goal status: ACHIEVED   LONG TERM GOALS: Target date: 01/12/2024  Pt will have cervical spine flexion/extension AROM to 30-35 deg or greater and C-spine rotation AROM to 60 deg or greater bilaterally as needed for completion of ADLs without functional limitation Baseline: 11/28/23: Motion loss in all directions, pain with extension.    01/18/24: Mild deficit with extension and L rotation.  Goal status: IN PROGRESS  2.  Pt will decrease worst neck pain by at least 2 points on the NPRS in order to demonstrate clinically significant reduction in neck pain. Baseline: 11/28/23: 10/10 at worst.    01/18/24: 1/10 at worst, 4/10 pain with end-range cervical rotation.  Goal status: ACHIEVED  3.  Pt will decrease NDI score by at least 19% in order demonstrate clinically significant reduction in neck pain/disability.       Baseline: 11/28/23: 10/50 = 20%    01/18/24: 3/50 = 6% Goal status: IN PROGRESS  4.  Pt will demonstrate golf swing and overhead throw with normal  mechanics and no reproduction of upper quarter pain as needed for participation in golf/throwing baseball with family. Baseline: 11/28/23: Pt currently unable to swing golf club or throw.      01/18/24: Pt able to initiate golf swinging at home without notable pain.  Goal status: ON-GOING/IN PROGRESS   PLAN: PT FREQUENCY: 1x/week  PT DURATION: 3-4 weeks  PLANNED INTERVENTIONS: Therapeutic exercises, Therapeutic activity, Neuromuscular re-education, Balance training, Gait training, Patient/Family education, Self Care, Joint mobilization, Joint manipulation, Vestibular training, Canalith repositioning, Orthotic/Fit training, DME instructions, Dry Needling, Electrical stimulation, Spinal manipulation, Spinal mobilization, Cryotherapy, Moist heat, Taping, Traction, Ultrasound, Ionotophoresis 4mg /ml Dexamethasone , Manual therapy, and Re-evaluation.  PLAN FOR NEXT SESSION: Continue with cervical spine mobility/ROM, postural re-edu; progress HEP with successive visits.   Denese Finn, PT, DPT #[id number]  Aleatha Hunting, PT 01/18/2024, 1:22 PM

## 2024-01-19 ENCOUNTER — Encounter: Payer: Self-pay | Admitting: Physical Therapy

## 2024-01-23 ENCOUNTER — Ambulatory Visit: Admitting: Physical Therapy

## 2024-01-23 DIAGNOSIS — M6281 Muscle weakness (generalized): Secondary | ICD-10-CM

## 2024-01-23 DIAGNOSIS — M542 Cervicalgia: Secondary | ICD-10-CM | POA: Diagnosis not present

## 2024-01-23 DIAGNOSIS — M25522 Pain in left elbow: Secondary | ICD-10-CM | POA: Diagnosis not present

## 2024-01-23 DIAGNOSIS — M79601 Pain in right arm: Secondary | ICD-10-CM | POA: Diagnosis not present

## 2024-01-23 NOTE — Therapy (Unsigned)
 OUTPATIENT PHYSICAL THERAPY TREATMENT  Patient Name: Jeff Mcmahon MRN: [id number] DOB:[birth year], 78 y.o., male Today's Date: 01/23/2024  END OF SESSION:  PT End of Session - 01/25/24 1033     Visit Number 11    Number of Visits 13    Date for PT Re-Evaluation 01/12/24    Authorization Type Medicare A&B 2025    PT Start Time 1427    PT Stop Time 1507    PT Time Calculation (min) 40 min    Activity Tolerance Patient tolerated treatment well    Behavior During Therapy WFL for tasks assessed/performed             Past Medical History:  Diagnosis Date   Arthritis    Dysrhythmia 2007   H/O IRREGULAR HEART BEAT-SAW CARDIOLOGIST AND SAID IT WAS NOTHING TO BE CONCERNED WITH   GERD (gastroesophageal reflux disease)    H/O   Hyperlipidemia    Pre-diabetes    Past Surgical History:  Procedure Laterality Date   COLONOSCOPY     COLONOSCOPY WITH PROPOFOL  N/A 06/02/2020   Procedure: COLONOSCOPY WITH PROPOFOL ;  Surgeon: Selena Daily, MD;  Location: [facility] ENDOSCOPY;  Service: Gastroenterology;  Laterality: N/A;   ESOPHAGOGASTRODUODENOSCOPY     ESOPHAGOGASTRODUODENOSCOPY (EGD) WITH PROPOFOL  N/A 09/02/2020   Procedure: ESOPHAGOGASTRODUODENOSCOPY (EGD) WITH PROPOFOL ;  Surgeon: Selena Daily, MD;  Location: [facility] ENDOSCOPY;  Service: Gastroenterology;  Laterality: N/A;   EXCISION MASS NECK N/A 09/07/2016   Procedure: EXCISION MASS NECK;  Surgeon: Claudia Cuff, MD;  Location: [facility] ORS;  Service: General;  Laterality: N/A;   EXCISION OF BACK LESION N/A 09/07/2016   Procedure: EXCISION OF BACK LESION;  Surgeon: Claudia Cuff, MD;  Location: [facility] ORS;  Service: General;  Laterality: N/A;   MOUTH SURGERY     dental procedure   RETINAL LASER PROCEDURE Right 04/06/2021   [facility] in [place]   Patient Active Problem List   Diagnosis Date Noted   Cervical radicular pain 11/08/2023   Chronic pain syndrome 11/08/2023   Retinal tear of right eye  12/21/2021   Dysphagia    Cervical spondylosis 10/02/2015   Prediabetes 09/09/2015   History of colonic polyps 09/05/2015   Hyperlipidemia 09/04/2015   GERD (gastroesophageal reflux disease) 09/04/2015   Dry eyes 09/04/2015    PCP: Clarise Crooks, MD (Inactive)  REFERRING PROVIDER: Cephus Collin, MD  REFERRING DIAG:  M54.12 (ICD-10-CM) - Cervical radicular pain  M47.812 (ICD-10-CM) - Cervical spondylosis  G89.4 (ICD-10-CM) - Chronic pain syndrome  M54.2 (ICD-10-CM) - Cervicalgia    RATIONALE FOR EVALUATION AND TREATMENT: Rehabilitation  THERAPY DIAG: Cervicalgia  Pain in right arm  Muscle weakness (generalized)  Pain in left elbow  ONSET DATE: Worsening pain since Feb 2024, chronic issue prior to that which was under control  FOLLOW-UP APPT SCHEDULED WITH REFERRING PROVIDER: Yes ; 12/07/23   PERTINENT HISTORY:  Pt is a 78 year old male referred for R-sided neck pain with R upper limb referral with atraumatic onset. Med hx including OA, GERD, HLD, pre-diabetes. MRI results have yet to be obtained from radiologist. Hx of C-spine X-rays with evidence of moderate degenerative changes and loss of lordosis.   Pt reports long-term problem that has gotten worse over last couple of months. He reports minor pain and motion deficits for approx 40 years. In Nov 2023, pt got a lot more stiffness in C-spine; R-sided neck pain primarily. Symptoms improved with Rx medication given by Dr. Augustus Ledger in the fall. Pt reports in February,  symptoms got much worse, up to 10/10. Prednisone  resolved pain completely, but pain came back in a couple of days. Pt reports notable pain with turning his head. Pt will consider ESI for 12/07/23. Uneasiness in neck at rest along posterior cervical spine. Symptoms are variable. Pt reports more issues with getting to sleep and with turning his head to change positions. Pt reports he can have posterior C-spine pain with wide jaw opening. No recent nausea, vomiting,  vertigo, dizziness, visual changes.    Pain:  Pain Intensity: Present: 2/10, Best: 1/10, Worst: 10/10 Pain location: Posterior C-spine and occipital region Pain Quality: sharp  Radiating: No  Numbness/Tingling: Yes; numbness in arms with cervical extension, down to bilat forearms  Focal Weakness: No Aggravating factors: turning R more than L, lying flat in bed, turning over in bed, looking overhead Relieving factors: keeping head in pain-free range, sitting with good head support, limiting ROM, heat, Bengay 24-hour pain behavior: worse at night time   History of prior neck injury, pain, surgery, or therapy: Yes;  Hx of RTC-related pain in both shoulders Dominant hand: left  Imaging: Yes ; *Cervical spine X-rays 1. Moderate degenerative changes. No evidence for acute abnormality. 2. Loss of cervical lordosis.  *Cervical spine MRI completed 11/11/23 - awaiting release of radiologist report   Red flags (personal history of cancer, h/o spinal tumors, history of compression fracture, chills/fever, night sweats, nausea, vomiting, unrelenting pain): Negative   PRECAUTIONS: None  WEIGHT BEARING RESTRICTIONS: No  FALLS: Has patient fallen in last 6 months? No  Living Environment Lives with: lives with their spouse; son lives 20 minutes away  Lives in: House/apartment   Prior level of function: Independent  Occupational demands: Retired  Hobbies: Multimedia programmer, able to throw baseball with son/grandkids  Patient Goals: Able to resolve pain     OBJECTIVE (data from initial evaluation unless otherwise dated):    Patient Surveys  NDI 10/50 = 20%  Posture Increased thoracic kyphosis, Mild Dowager's hump, forward head. Resting in posterior pelvic tilt/kyphotic posture in sitting.   AROM AROM (Normal range in degrees) AROM 11/28/23 AROM 01/18/24  Cervical   Flexion (50) 25 55  Extension (80) 13* (Muscles grab back of neck) 25  Right lateral flexion (45) 15* 25  Left lateral  flexion (45) 16* 12  Right rotation (85) 42 62  Left rotation (85) 39 55  (* = pain; Blank rows = not tested)   MMT MMT (out of 5) Right 11/28/23 Left 11/28/23  Cervical (isometric)  Flexion WNL (numbness reproduced bilat forearms)  Extension WNL  Lateral Flexion WNL WNL  Rotation WNL*(numbness reproduced bilat forearms) WNL*      Shoulder   Flexion 4+ 4+  Extension    Abduction 4+ 4+* ( L shoulder pain)  Internal rotation 4+ 4+  External rotation  4+ 4+  Horizontal abduction    Horizontal adduction    Lower Trapezius    Rhomboids        Elbow  Flexion 5 5  Extension 5 5  Pronation    Supination        Wrist  Flexion 4+ 4+  Extension 5 5  Radial deviation    Ulnar deviation        (* = pain; Blank rows = not tested)  Sensation Grossly intact to light touch bilateral UE as determined by testing dermatomes C2-T2. Proprioception and hot/cold testing deferred on this date.  Reflexes R/L Elbow: 1+/1+  Brachioradialis: 2+/2+  Tricep: 2+/2+ UMN signs: Negative (  Hoffman's and Clonus)  Palpation Location LEFT  RIGHT           Suboccipitals 1 1  Cervical paraspinals 1 1  Upper Trapezius 1 (n/t reproduced) 1 (n/t reproduced)  Levator Scapulae    Rhomboid Major/Minor    (Blank rows = not tested) Graded on 0-4 scale (0 = no pain, 1 = pain, 2 = pain with wincing/grimacing/flinching, 3 = pain with withdrawal, 4 = unwilling to allow palpation), (Blank rows = not tested)  Repeated Movements Repeated retraction: Mild pressure in back of head, no worse after  Passive Accessory Intervertebral Motion Significant hypomobility C3-C6, decreased sideglide bilaterally in mid to lower C-spine with pain at restriction   SPECIAL TESTS Spurlings A (ipsilateral lateral flexion/axial compression): R: Positive for neck pain L: Positive for neck pain Spurlings B (ipsilateral lateral flexion/contralateral rotation/axial compression): R: Negative L: Negative Cervical compression:  Reproduction of N/T Distraction Test: No pain, residual numbness that is not resolved  Hoffman Sign (cervical cord compression): R: Negative L: Negative ULTT Median: R: Negative L: Negative ULTT Ulnar: R: Positive for neck pain L: Negative ULTT Radial: R: Negative L: Negative     TODAY'S TREATMENT    SUBJECTIVE STATEMENT:   Patient reports some pain/spasm along R upper trap after deep tissue work after last visit. Patient reports some stiffness affecting L paracervical region. Patient reports having some R upper trap pain with throwing with his grandson on Friday. Patient reports completing throwing with his grandson this AM (throws L handed). Pt reports no notable pain at arrival.   Cervical AROM Extension 27 Rotation: R 59, L 45    Manual Therapy - for symptom modulation, soft tissue sensitivity and mobility, joint mobility, ROM   In supine: Manual traction, general in supine; x 3 minutes, 10-sec intermittent holds Manual traction with conjunct L rotation with oscillations to movement barrier for 1-sec; x 10 reps  Bilateral cervical sideglides, gr III for improved mobility, C3-6; 3 x 30 sec on each side    STM/DTM bilateral C4-6 splenius cervicis x 10 minutes DTM/TPR bilateral upper traps; x 5 minutes   Passive cervical spine rotation with contralateral anterior facet glide with therapist's digits 2-5 along articular pillar; x 10 for R and L     POST-TREATMENT AROM:  Extension 31  Rotation: R 62, L 59   *not today* CPA along C3-4 and C5-7, grade III for mobility; therapist's 1st MCP along articular pillars bilat; 2 x 30 sec bouts Cervical traction with pt sidelying L; 10 sec intervals; 2 bouts x 5 minutes Contact-relax technique for R and L rotation; antagonist contraction; x 5 for either direction   -Cervical spine rotation: R 55, L 50  PA mobilization in supine, using PT's 1st MCPs on each articular pillar   -numbness bilaterally increased with CPA at C4-6 Manual  median nerve glides with maintenance of scapular depression, LUE; patient performing simultaneous cervical lateral flexion toward side of symptoms with extended position of wrist/elbow  X 20 repetitions    Therapeutic Exercise - for improved soft tissue flexibility and extensibility as needed for ROM, improved strength as needed to improve performance of CKC activities/functional movements     Bow and arrow; reviewed  Cervical SNAG for rotation; reviewed  Standing row with Blue Tband; 2 x 10, 3 sec hold   -PT tactile cueing for scap retraction/depression  PATIENT EDUCATION: HEP updated/reviewed. Discussed tapered POC and prognosis.     *not today* Standing bilateral ER with scap retraction, Green Tband; 2  x 10  -modified tension on band to improve tolerance of posterior cuff strengthening In sitting: muscle energy technique for cervical rotation; antagonist contraction; 5-sec isometric; x 5 for R and L rotation Standing extension with Blue Tband; 2 x 10, 3 sec hold  -PT tactile cueing for scap retraction/depression  -verbal cueing for eccentric control    Supine, cervical sidebend stretch; 2 x 30 sec, to L and to R   -mod verbal cueing and demo for technique/lateral flexion versus rotation Median nerve flossing; 1x10 on either side Repeated cervical retraction, supine; 1 x 10  -reproduction of bilateral numbness, mildly remaining after  -minor numbness remaining after rest period  Repeated cervical flexion; 1 x 10  -numbness LUE>RUE   -after rest period 1 min, numbness remaining in L arm  Repeated cervical sidebend to L; 1 x 10 Scapular retractions; 1 x 10 and 1x6, 3 sec hold  -decreased reps on second set due to report of L UT pain      PATIENT EDUCATION:  Education details: see above for patient education details Person educated: Patient Education method: Explanation, Demonstration, and Handouts Education comprehension: verbalized understanding and returned  demonstration   HOME EXERCISE PROGRAM:  Access Code: [email or web address] URL: [email or web address] Date: 01/23/2024 Prepared by: Denese Finn  Exercises - Seated Self Cervical Traction  - 2 x daily - 7 x weekly - 2 sets - 10 reps - 5-10sec hold - Supine Cervical Sidebending Stretch  - 2 x daily - 7 x weekly - 3 sets - 10 reps - 20-30sec hold - Median Nerve Flossing - Tray  - 2 x daily - 7 x weekly - 2 sets - 10 reps - 1sec hold - Seated Assisted Cervical Rotation with Towel  - 2 x daily - 7 x weekly - 1-2 sets - 10 reps - Mid-Lower Cervical Extension SNAG with Strap  - 2 x daily - 7 x weekly - 1-2 sets - 10 reps - Sidelying Thoracic Rotation with Open Book  - 2 x daily - 7 x weekly - 2 sets - 10 reps - 2sec hold - Standing Shoulder Row with Anchored Resistance  - 1 x daily - 4 x weekly - 2 sets - 10 reps - 3sec hold   ASSESSMENT:  CLINICAL IMPRESSION: Patient has minimal pain at rest/at arrival to clinic. He has tolerated initial trials of throwing and golf club swinging well without aggravation of neck pain or upper limb paresthesias. Pt had successful ESI with Dr. Rhesa Celeste. Post-treatment, he exhibits grossly WFL cervical spine extension and bilat rotation (however, this does require some manual therapy and re-testing to attain). Pt has current deficits in C-spine AROM, mid to lower cervical spine stiffness/hypomobility, decreased tolerance to loading bilat deltoid/RTC, and postural changes/upper crossed presentation. Pt will continue to benefit from skilled PT services to address deficits and improve function.   OBJECTIVE IMPAIRMENTS: decreased ROM, decreased strength, hypomobility, impaired flexibility, impaired UE functional use, postural dysfunction, and pain.   ACTIVITY LIMITATIONS: carrying, lifting, bending, sleeping, transfers, bed mobility, and reach over head  PARTICIPATION LIMITATIONS: meal prep, driving, shopping, community activity, golfing, and throwing baseball with  son/grandson  PERSONAL FACTORS: Age, Past/current experiences, Time since onset of injury/illness/exacerbation, and 3+ comorbidities: (OA, HLD, pre-diabetes, chronic neck pain with acute flare-up) are also affecting patient's functional outcome.   REHAB POTENTIAL: Good  CLINICAL DECISION MAKING: Unstable/unpredictable  EVALUATION COMPLEXITY: High   GOALS: Goals reviewed with patient? Yes  SHORT TERM GOALS: Target date: 12/19/2023  Pt will be  independent with HEP to improve strength and decrease neck pain to improve pain-free function at home and work. Baseline: 11/28/23: Baseline HEP initiated.    01/18/24: Pt verbalizes sound understanding of exercises and has largely maintained compliance.  Goal status: ACHIEVED   LONG TERM GOALS: Target date: 01/12/2024  Pt will have cervical spine flexion/extension AROM to 30-35 deg or greater and C-spine rotation AROM to 60 deg or greater bilaterally as needed for completion of ADLs without functional limitation Baseline: 11/28/23: Motion loss in all directions, pain with extension.    01/18/24: Mild deficit with extension and L rotation.  Goal status: IN PROGRESS  2.  Pt will decrease worst neck pain by at least 2 points on the NPRS in order to demonstrate clinically significant reduction in neck pain. Baseline: 11/28/23: 10/10 at worst.    01/18/24: 1/10 at worst, 4/10 pain with end-range cervical rotation.  Goal status: ACHIEVED  3.  Pt will decrease NDI score by at least 19% in order demonstrate clinically significant reduction in neck pain/disability.       Baseline: 11/28/23: 10/50 = 20%    01/18/24: 3/50 = 6% Goal status: IN PROGRESS  4.  Pt will demonstrate golf swing and overhead throw with normal mechanics and no reproduction of upper quarter pain as needed for participation in golf/throwing baseball with family. Baseline: 11/28/23: Pt currently unable to swing golf club or throw.      01/18/24: Pt able to initiate golf swinging at home without  notable pain.  Goal status: ON-GOING/IN PROGRESS   PLAN: PT FREQUENCY: 1x/week  PT DURATION: 3-4 weeks  PLANNED INTERVENTIONS: Therapeutic exercises, Therapeutic activity, Neuromuscular re-education, Balance training, Gait training, Patient/Family education, Self Care, Joint mobilization, Joint manipulation, Vestibular training, Canalith repositioning, Orthotic/Fit training, DME instructions, Dry Needling, Electrical stimulation, Spinal manipulation, Spinal mobilization, Cryotherapy, Moist heat, Taping, Traction, Ultrasound, Ionotophoresis 4mg /ml Dexamethasone , Manual therapy, and Re-evaluation.  PLAN FOR NEXT SESSION: Continue with cervical spine mobility/ROM, postural re-edu; progress HEP with successive visits.   Denese Finn, PT, DPT #[id number]  Aleatha Hunting, PT 01/25/2024, 10:33 AM

## 2024-01-25 ENCOUNTER — Encounter: Payer: Self-pay | Admitting: Physical Therapy

## 2024-01-25 ENCOUNTER — Ambulatory Visit: Admitting: Physical Therapy

## 2024-01-30 ENCOUNTER — Ambulatory Visit: Admitting: Physical Therapy

## 2024-01-30 DIAGNOSIS — M79601 Pain in right arm: Secondary | ICD-10-CM

## 2024-01-30 DIAGNOSIS — M6281 Muscle weakness (generalized): Secondary | ICD-10-CM | POA: Diagnosis not present

## 2024-01-30 DIAGNOSIS — M542 Cervicalgia: Secondary | ICD-10-CM | POA: Diagnosis not present

## 2024-01-30 DIAGNOSIS — M25522 Pain in left elbow: Secondary | ICD-10-CM | POA: Diagnosis not present

## 2024-01-30 NOTE — Therapy (Unsigned)
 OUTPATIENT PHYSICAL THERAPY TREATMENT   Patient Name: Jeff Mcmahon MRN: [id number] DOB:1946/02/10, 78 y.o., male Today's Date: 01/30/2024  END OF SESSION:  PT End of Session - 01/30/24 1335     Visit Number 12    Number of Visits 13    Date for PT Re-Evaluation 01/12/24    Authorization Type Medicare A&B 2025    PT Start Time 1334    PT Stop Time 1412    PT Time Calculation (min) 38 min    Activity Tolerance Patient tolerated treatment well    Behavior During Therapy WFL for tasks assessed/performed            Past Medical History:  Diagnosis Date   Arthritis    Dysrhythmia 2007   H/O IRREGULAR HEART BEAT-SAW CARDIOLOGIST AND SAID IT WAS NOTHING TO BE CONCERNED WITH   GERD (gastroesophageal reflux disease)    H/O   Hyperlipidemia    Pre-diabetes    Past Surgical History:  Procedure Laterality Date   COLONOSCOPY     COLONOSCOPY WITH PROPOFOL  N/A 06/02/2020   Procedure: COLONOSCOPY WITH PROPOFOL ;  Surgeon: Unk Corinn Skiff, MD;  Location: [facility];  Service: Gastroenterology;  Laterality: N/A;   ESOPHAGOGASTRODUODENOSCOPY     ESOPHAGOGASTRODUODENOSCOPY (EGD) WITH PROPOFOL  N/A 09/02/2020   Procedure: ESOPHAGOGASTRODUODENOSCOPY (EGD) WITH PROPOFOL ;  Surgeon: Unk Corinn Skiff, MD;  Location: [facility];  Service: Gastroenterology;  Laterality: N/A;   EXCISION MASS NECK N/A 09/07/2016   Procedure: EXCISION MASS NECK;  Surgeon: Charlie FORBES Fell, MD;  Location: [facility];  Service: General;  Laterality: N/A;   EXCISION OF BACK LESION N/A 09/07/2016   Procedure: EXCISION OF BACK LESION;  Surgeon: Charlie FORBES Fell, MD;  Location: [facility];  Service: General;  Laterality: N/A;   MOUTH SURGERY     dental procedure   RETINAL LASER PROCEDURE Right 04/06/2021   [facility] in [place]   Patient Active Problem List   Diagnosis Date Noted   Cervical radicular pain 11/08/2023   Chronic pain syndrome 11/08/2023   Retinal tear of right eye  12/21/2021   Dysphagia    Cervical spondylosis 10/02/2015   Prediabetes 09/09/2015   History of colonic polyps 09/05/2015   Hyperlipidemia 09/04/2015   GERD (gastroesophageal reflux disease) 09/04/2015   Dry eyes 09/04/2015    PCP: Joshua Cathryne BROCKS, MD (Inactive)  REFERRING PROVIDER: Marcelino Nurse, MD  REFERRING DIAG:  M54.12 (ICD-10-CM) - Cervical radicular pain  M47.812 (ICD-10-CM) - Cervical spondylosis  G89.4 (ICD-10-CM) - Chronic pain syndrome  M54.2 (ICD-10-CM) - Cervicalgia    RATIONALE FOR EVALUATION AND TREATMENT: Rehabilitation  THERAPY DIAG: Cervicalgia  Pain in right arm  Muscle weakness (generalized)  ONSET DATE: Worsening pain since Feb 2024, chronic issue prior to that which was under control  FOLLOW-UP APPT SCHEDULED WITH REFERRING PROVIDER: Yes ; 12/07/23   PERTINENT HISTORY:  Pt is a 78 year old male referred for R-sided neck pain with R upper limb referral with atraumatic onset. Med hx including OA, GERD, HLD, pre-diabetes. MRI results have yet to be obtained from radiologist. Hx of C-spine X-rays with evidence of moderate degenerative changes and loss of lordosis.   Pt reports long-term problem that has gotten worse over last couple of months. He reports minor pain and motion deficits for approx 40 years. In Nov 2023, pt got a lot more stiffness in C-spine; R-sided neck pain primarily. Symptoms improved with Rx medication given by Dr. Alvia in the fall. Pt reports in February, symptoms got much worse, up  to 10/10. Prednisone  resolved pain completely, but pain came back in a couple of days. Pt reports notable pain with turning his head. Pt will consider ESI for 12/07/23. Uneasiness in neck at rest along posterior cervical spine. Symptoms are variable. Pt reports more issues with getting to sleep and with turning his head to change positions. Pt reports he can have posterior C-spine pain with wide jaw opening. No recent nausea, vomiting, vertigo, dizziness,  visual changes.    Pain:  Pain Intensity: Present: 2/10, Best: 1/10, Worst: 10/10 Pain location: Posterior C-spine and occipital region Pain Quality: sharp  Radiating: No  Numbness/Tingling: Yes; numbness in arms with cervical extension, down to bilat forearms  Focal Weakness: No Aggravating factors: turning R more than L, lying flat in bed, turning over in bed, looking overhead Relieving factors: keeping head in pain-free range, sitting with good head support, limiting ROM, heat, Bengay 24-hour pain behavior: worse at night time   History of prior neck injury, pain, surgery, or therapy: Yes;  Hx of RTC-related pain in both shoulders Dominant hand: left  Imaging: Yes ; *Cervical spine X-rays 1. Moderate degenerative changes. No evidence for acute abnormality. 2. Loss of cervical lordosis.  *Cervical spine MRI completed 11/11/23 - awaiting release of radiologist report   Red flags (personal history of cancer, h/o spinal tumors, history of compression fracture, chills/fever, night sweats, nausea, vomiting, unrelenting pain): Negative   PRECAUTIONS: None  WEIGHT BEARING RESTRICTIONS: No  FALLS: Has patient fallen in last 6 months? No  Living Environment Lives with: lives with their spouse; son lives 20 minutes away  Lives in: House/apartment   Prior level of function: Independent  Occupational demands: Retired  Hobbies: Multimedia programmer, able to throw baseball with son/grandkids  Patient Goals: Able to resolve pain     OBJECTIVE (data from initial evaluation unless otherwise dated):    Patient Surveys  NDI 10/50 = 20%  Posture Increased thoracic kyphosis, Mild Dowager's hump, forward head. Resting in posterior pelvic tilt/kyphotic posture in sitting.   AROM AROM (Normal range in degrees) AROM 11/28/23 AROM 01/18/24  Cervical   Flexion (50) 25 55  Extension (80) 13* (Muscles grab back of neck) 25  Right lateral flexion (45) 15* 25  Left lateral flexion (45) 16* 12   Right rotation (85) 42 62  Left rotation (85) 39 55  (* = pain; Blank rows = not tested)   MMT MMT (out of 5) Right 11/28/23 Left 11/28/23  Cervical (isometric)  Flexion WNL (numbness reproduced bilat forearms)  Extension WNL  Lateral Flexion WNL WNL  Rotation WNL*(numbness reproduced bilat forearms) WNL*      Shoulder   Flexion 4+ 4+  Extension    Abduction 4+ 4+* ( L shoulder pain)  Internal rotation 4+ 4+  External rotation  4+ 4+  Horizontal abduction    Horizontal adduction    Lower Trapezius    Rhomboids        Elbow  Flexion 5 5  Extension 5 5  Pronation    Supination        Wrist  Flexion 4+ 4+  Extension 5 5  Radial deviation    Ulnar deviation        (* = pain; Blank rows = not tested)  Sensation Grossly intact to light touch bilateral UE as determined by testing dermatomes C2-T2. Proprioception and hot/cold testing deferred on this date.  Reflexes R/L Elbow: 1+/1+  Brachioradialis: 2+/2+  Tricep: 2+/2+ UMN signs: Negative (Hoffman's and Clonus)  Palpation  Location LEFT  RIGHT           Suboccipitals 1 1  Cervical paraspinals 1 1  Upper Trapezius 1 (n/t reproduced) 1 (n/t reproduced)  Levator Scapulae    Rhomboid Major/Minor    (Blank rows = not tested) Graded on 0-4 scale (0 = no pain, 1 = pain, 2 = pain with wincing/grimacing/flinching, 3 = pain with withdrawal, 4 = unwilling to allow palpation), (Blank rows = not tested)  Repeated Movements Repeated retraction: Mild pressure in back of head, no worse after  Passive Accessory Intervertebral Motion Significant hypomobility C3-C6, decreased sideglide bilaterally in mid to lower C-spine with pain at restriction   SPECIAL TESTS Spurlings A (ipsilateral lateral flexion/axial compression): R: Positive for neck pain L: Positive for neck pain Spurlings B (ipsilateral lateral flexion/contralateral rotation/axial compression): R: Negative L: Negative Cervical compression: Reproduction of  N/T Distraction Test: No pain, residual numbness that is not resolved  Hoffman Sign (cervical cord compression): R: Negative L: Negative ULTT Median: R: Negative L: Negative ULTT Ulnar: R: Positive for neck pain L: Negative ULTT Radial: R: Negative L: Negative     TODAY'S TREATMENT    SUBJECTIVE STATEMENT:   Patient reports completing throwing with his grandson and tolerating this well. He played golf - 6 holes this game (he usually plays 9). Patient reports feeling some grab along L paracervical region with first 2 swings; he reports notable fatigue along L paracervical/UT region. He reports stiffness the following morning; he states he usually feels notable AM stiffness. Pt reports tolerating home exercises, including bow and arrow, relatively well.   Cervical AROM Flexion WNL Extension Moderate motion loss Rotation: R Mild motion loss, L moderate motion loss Lateral flexion: Significant motion loss either direction    Manual Therapy - for symptom modulation, soft tissue sensitivity and mobility, joint mobility, ROM   In supine: Manual traction, general in supine; x 3 minutes, 10-sec intermittent holds  Bilateral cervical sideglides, gr III for improved mobility, C3-6; 3 x 30 sec on each side    STM/DTM bilateral C4-6 splenius cervicis x 10 minutes DTM/TPR bilateral upper traps; x 5 minutes   Passive cervical spine rotation with contralateral anterior facet glide with therapist's digits 2-5 along articular pillar; x 10 for R and L     POST-TREATMENT AROM:  Extension 32  Rotation: R 52, L 59    *not today*  Manual traction with conjunct L rotation with oscillations to movement barrier for 1-sec; x 10 reps CPA along C3-4 and C5-7, grade III for mobility; therapist's 1st MCP along articular pillars bilat; 2 x 30 sec bouts Cervical traction with pt sidelying L; 10 sec intervals; 2 bouts x 5 minutes Contact-relax technique for R and L rotation; antagonist contraction; x 5 for  either direction   -Cervical spine rotation: R 55, L 50  PA mobilization in supine, using PT's 1st MCPs on each articular pillar   -numbness bilaterally increased with CPA at C4-6 Manual median nerve glides with maintenance of scapular depression, LUE; patient performing simultaneous cervical lateral flexion toward side of symptoms with extended position of wrist/elbow  X 20 repetitions    Therapeutic Exercise - for improved soft tissue flexibility and extensibility as needed for ROM, improved strength as needed to improve performance of CKC activities/functional movements     Bow and arrow; x20 on either side  Cervical SNAG for rotation; reviewed   D2 flexion with Red Tband; 2 x 10  Standing row with Nautilus; 40 lbs  Tennis ball forward throw to/from PT at 15 and 20 feet; x 5 throws each  -to check for symptom reproduction or movement deficits/deviations  PATIENT EDUCATION: HEP reviewed. Discussed gradual return to activity as tolerated. Discussed tapered POC and prognosis.     *not today*  Standing row with Blue Tband; 2 x 10, 3 sec hold   -PT tactile cueing for scap retraction/depression Standing bilateral ER with scap retraction, Green Tband; 2 x 10  -modified tension on band to improve tolerance of posterior cuff strengthening In sitting: muscle energy technique for cervical rotation; antagonist contraction; 5-sec isometric; x 5 for R and L rotation Standing extension with Blue Tband; 2 x 10, 3 sec hold  -PT tactile cueing for scap retraction/depression  -verbal cueing for eccentric control    Supine, cervical sidebend stretch; 2 x 30 sec, to L and to R   -mod verbal cueing and demo for technique/lateral flexion versus rotation Median nerve flossing; 1x10 on either side Repeated cervical retraction, supine; 1 x 10  -reproduction of bilateral numbness, mildly remaining after  -minor numbness remaining after rest period  Repeated cervical flexion; 1 x 10  -numbness  LUE>RUE   -after rest period 1 min, numbness remaining in L arm  Repeated cervical sidebend to L; 1 x 10 Scapular retractions; 1 x 10 and 1x6, 3 sec hold  -decreased reps on second set due to report of L UT pain      PATIENT EDUCATION:  Education details: see above for patient education details Person educated: Patient Education method: Explanation, Demonstration, and Handouts Education comprehension: verbalized understanding and returned demonstration   HOME EXERCISE PROGRAM:  Access Code: [email or web address] URL: [email or web address] Date: 01/23/2024 Prepared by: Venetia Endo  Exercises - Seated Self Cervical Traction  - 2 x daily - 7 x weekly - 2 sets - 10 reps - 5-10sec hold - Supine Cervical Sidebending Stretch  - 2 x daily - 7 x weekly - 3 sets - 10 reps - 20-30sec hold - Median Nerve Flossing - Tray  - 2 x daily - 7 x weekly - 2 sets - 10 reps - 1sec hold - Seated Assisted Cervical Rotation with Towel  - 2 x daily - 7 x weekly - 1-2 sets - 10 reps - Mid-Lower Cervical Extension SNAG with Strap  - 2 x daily - 7 x weekly - 1-2 sets - 10 reps - Sidelying Thoracic Rotation with Open Book  - 2 x daily - 7 x weekly - 2 sets - 10 reps - 2sec hold - Standing Shoulder Row with Anchored Resistance  - 1 x daily - 4 x weekly - 2 sets - 10 reps - 3sec hold   ASSESSMENT:  CLINICAL IMPRESSION: Patient does present with notable stiffness at arrival to his appointments, but he has been able to attain functional cervical spine extension and bilat rotation with treatment. Pt is just shy of functional rotation ROM post-treatment today. He has intermittent discomfort with initiating golf during first 2 swings, but he was able to resume play with some fatigue and no major flare-up of symptoms. He tolerated throwing with his grandson relatively well. Pt has one remaining visit under current POC. Pt has current deficits in C-spine AROM, mid to lower cervical spine stiffness/hypomobility,  decreased tolerance to loading bilat deltoid/RTC, and postural changes/upper crossed presentation. Pt will continue to benefit from skilled PT services to address deficits and improve function.   OBJECTIVE IMPAIRMENTS: decreased ROM, decreased strength, hypomobility, impaired flexibility, impaired UE functional  use, postural dysfunction, and pain.   ACTIVITY LIMITATIONS: carrying, lifting, bending, sleeping, transfers, bed mobility, and reach over head  PARTICIPATION LIMITATIONS: meal prep, driving, shopping, community activity, golfing, and throwing baseball with son/grandson  PERSONAL FACTORS: Age, Past/current experiences, Time since onset of injury/illness/exacerbation, and 3+ comorbidities: (OA, HLD, pre-diabetes, chronic neck pain with acute flare-up) are also affecting patient's functional outcome.   REHAB POTENTIAL: Good  CLINICAL DECISION MAKING: Unstable/unpredictable  EVALUATION COMPLEXITY: High   GOALS: Goals reviewed with patient? Yes  SHORT TERM GOALS: Target date: 12/19/2023  Pt will be independent with HEP to improve strength and decrease neck pain to improve pain-free function at home and work. Baseline: 11/28/23: Baseline HEP initiated.    01/18/24: Pt verbalizes sound understanding of exercises and has largely maintained compliance.  Goal status: ACHIEVED   LONG TERM GOALS: Target date: 01/12/2024  Pt will have cervical spine flexion/extension AROM to 30-35 deg or greater and C-spine rotation AROM to 60 deg or greater bilaterally as needed for completion of ADLs without functional limitation Baseline: 11/28/23: Motion loss in all directions, pain with extension.    01/18/24: Mild deficit with extension and L rotation.  Goal status: IN PROGRESS  2.  Pt will decrease worst neck pain by at least 2 points on the NPRS in order to demonstrate clinically significant reduction in neck pain. Baseline: 11/28/23: 10/10 at worst.    01/18/24: 1/10 at worst, 4/10 pain with end-range  cervical rotation.  Goal status: ACHIEVED  3.  Pt will decrease NDI score by at least 19% in order demonstrate clinically significant reduction in neck pain/disability.       Baseline: 11/28/23: 10/50 = 20%    01/18/24: 3/50 = 6% Goal status: IN PROGRESS  4.  Pt will demonstrate golf swing and overhead throw with normal mechanics and no reproduction of upper quarter pain as needed for participation in golf/throwing baseball with family. Baseline: 11/28/23: Pt currently unable to swing golf club or throw.      01/18/24: Pt able to initiate golf swinging at home without notable pain.  Goal status: ON-GOING/IN PROGRESS   PLAN: PT FREQUENCY: 1x/week  PT DURATION: 3-4 weeks  PLANNED INTERVENTIONS: Therapeutic exercises, Therapeutic activity, Neuromuscular re-education, Balance training, Gait training, Patient/Family education, Self Care, Joint mobilization, Joint manipulation, Vestibular training, Canalith repositioning, Orthotic/Fit training, DME instructions, Dry Needling, Electrical stimulation, Spinal manipulation, Spinal mobilization, Cryotherapy, Moist heat, Taping, Traction, Ultrasound, Ionotophoresis 4mg /ml Dexamethasone , Manual therapy, and Re-evaluation.  PLAN FOR NEXT SESSION: Continue with cervical spine mobility/ROM, postural re-edu; progress HEP with successive visits.   Venetia Endo, PT, DPT #[id number]  Venetia ONEIDA Endo, PT 01/30/2024, 2:14 PM

## 2024-01-31 ENCOUNTER — Encounter: Payer: Self-pay | Admitting: Physical Therapy

## 2024-02-15 ENCOUNTER — Encounter: Payer: Self-pay | Admitting: Physical Therapy

## 2024-02-15 ENCOUNTER — Ambulatory Visit: Attending: Student in an Organized Health Care Education/Training Program | Admitting: Physical Therapy

## 2024-02-15 DIAGNOSIS — M542 Cervicalgia: Secondary | ICD-10-CM | POA: Insufficient documentation

## 2024-02-15 DIAGNOSIS — M6281 Muscle weakness (generalized): Secondary | ICD-10-CM | POA: Insufficient documentation

## 2024-02-15 DIAGNOSIS — M79601 Pain in right arm: Secondary | ICD-10-CM | POA: Insufficient documentation

## 2024-02-15 NOTE — Therapy (Unsigned)
 OUTPATIENT PHYSICAL THERAPY TREATMENT/DISCHARGE SUMMARY   Patient Name: Jeff Mcmahon MRN: [id number] DOB:1945-12-21, 78 y.o., male Today's Date: 02/15/2024  END OF SESSION:  PT End of Session - 02/15/24 1409     Visit Number 13    Number of Visits 13    Date for PT Re-Evaluation 01/12/24    Authorization Type Medicare A&B 2025    PT Start Time 1411    PT Stop Time 1457    PT Time Calculation (min) 46 min    Activity Tolerance Patient tolerated treatment well    Behavior During Therapy WFL for tasks assessed/performed           Past Medical History:  Diagnosis Date   Arthritis    Dysrhythmia 2007   H/O IRREGULAR HEART BEAT-SAW CARDIOLOGIST AND SAID IT WAS NOTHING TO BE CONCERNED WITH   GERD (gastroesophageal reflux disease)    H/O   Hyperlipidemia    Pre-diabetes    Past Surgical History:  Procedure Laterality Date   COLONOSCOPY     COLONOSCOPY WITH PROPOFOL  N/A 06/02/2020   Procedure: COLONOSCOPY WITH PROPOFOL ;  Surgeon: Unk Corinn Skiff, MD;  Location: [facility];  Service: Gastroenterology;  Laterality: N/A;   ESOPHAGOGASTRODUODENOSCOPY     ESOPHAGOGASTRODUODENOSCOPY (EGD) WITH PROPOFOL  N/A 09/02/2020   Procedure: ESOPHAGOGASTRODUODENOSCOPY (EGD) WITH PROPOFOL ;  Surgeon: Unk Corinn Skiff, MD;  Location: [facility];  Service: Gastroenterology;  Laterality: N/A;   EXCISION MASS NECK N/A 09/07/2016   Procedure: EXCISION MASS NECK;  Surgeon: Charlie FORBES Fell, MD;  Location: [facility];  Service: General;  Laterality: N/A;   EXCISION OF BACK LESION N/A 09/07/2016   Procedure: EXCISION OF BACK LESION;  Surgeon: Charlie FORBES Fell, MD;  Location: [facility];  Service: General;  Laterality: N/A;   MOUTH SURGERY     dental procedure   RETINAL LASER PROCEDURE Right 04/06/2021   [facility] in [place]   Patient Active Problem List   Diagnosis Date Noted   Cervical radicular pain 11/08/2023   Chronic pain syndrome 11/08/2023   Retinal tear of  right eye 12/21/2021   Dysphagia    Cervical spondylosis 10/02/2015   Prediabetes 09/09/2015   History of colonic polyps 09/05/2015   Hyperlipidemia 09/04/2015   GERD (gastroesophageal reflux disease) 09/04/2015   Dry eyes 09/04/2015    PCP: Joshua Cathryne BROCKS, MD (Inactive)  REFERRING PROVIDER: Marcelino Nurse, MD  REFERRING DIAG:  M54.12 (ICD-10-CM) - Cervical radicular pain  M47.812 (ICD-10-CM) - Cervical spondylosis  G89.4 (ICD-10-CM) - Chronic pain syndrome  M54.2 (ICD-10-CM) - Cervicalgia    RATIONALE FOR EVALUATION AND TREATMENT: Rehabilitation  THERAPY DIAG: Cervicalgia  Pain in right arm  Muscle weakness (generalized)  ONSET DATE: Worsening pain since Feb 2024, chronic issue prior to that which was under control  FOLLOW-UP APPT SCHEDULED WITH REFERRING PROVIDER: Yes ; 12/07/23   PERTINENT HISTORY:  Pt is a 78 year old male referred for R-sided neck pain with R upper limb referral with atraumatic onset. Med hx including OA, GERD, HLD, pre-diabetes. MRI results have yet to be obtained from radiologist. Hx of C-spine X-rays with evidence of moderate degenerative changes and loss of lordosis.   Pt reports long-term problem that has gotten worse over last couple of months. He reports minor pain and motion deficits for approx 40 years. In Nov 2023, pt got a lot more stiffness in C-spine; R-sided neck pain primarily. Symptoms improved with Rx medication given by Dr. Alvia in the fall. Pt reports in February, symptoms got much worse, up  to 10/10. Prednisone  resolved pain completely, but pain came back in a couple of days. Pt reports notable pain with turning his head. Pt will consider ESI for 12/07/23. Uneasiness in neck at rest along posterior cervical spine. Symptoms are variable. Pt reports more issues with getting to sleep and with turning his head to change positions. Pt reports he can have posterior C-spine pain with wide jaw opening. No recent nausea, vomiting, vertigo,  dizziness, visual changes.    Pain:  Pain Intensity: Present: 2/10, Best: 1/10, Worst: 10/10 Pain location: Posterior C-spine and occipital region Pain Quality: sharp  Radiating: No  Numbness/Tingling: Yes; numbness in arms with cervical extension, down to bilat forearms  Focal Weakness: No Aggravating factors: turning R more than L, lying flat in bed, turning over in bed, looking overhead Relieving factors: keeping head in pain-free range, sitting with good head support, limiting ROM, heat, Bengay 24-hour pain behavior: worse at night time   History of prior neck injury, pain, surgery, or therapy: Yes;  Hx of RTC-related pain in both shoulders Dominant hand: left  Imaging: Yes ; *Cervical spine X-rays 1. Moderate degenerative changes. No evidence for acute abnormality. 2. Loss of cervical lordosis.  *Cervical spine MRI completed 11/11/23 - awaiting release of radiologist report   Red flags (personal history of cancer, h/o spinal tumors, history of compression fracture, chills/fever, night sweats, nausea, vomiting, unrelenting pain): Negative   PRECAUTIONS: None  WEIGHT BEARING RESTRICTIONS: No  FALLS: Has patient fallen in last 6 months? No  Living Environment Lives with: lives with their spouse; son lives 20 minutes away  Lives in: House/apartment   Prior level of function: Independent  Occupational demands: Retired  Hobbies: Multimedia programmer, able to throw baseball with son/grandkids  Patient Goals: Able to resolve pain     OBJECTIVE (data from initial evaluation unless otherwise dated):    Patient Surveys  NDI 10/50 = 20%  Posture Increased thoracic kyphosis, Mild Dowager's hump, forward head. Resting in posterior pelvic tilt/kyphotic posture in sitting.   AROM AROM (Normal range in degrees) AROM 11/28/23 AROM 01/18/24 AROM 02/15/24  Cervical    Flexion (50) 25 55 55  Extension (80) 13* (Muscles grab back of neck) 25 29  Right lateral flexion (45) 15* 25 20*  (pain at end-range)  Left lateral flexion (45) 16* 12 10* (pain at end-range)  Right rotation (85) 42 62 58  Left rotation (85) 39 55 57  (* = pain; Blank rows = not tested)   MMT MMT (out of 5) Right 11/28/23 Left 11/28/23  Cervical (isometric)  Flexion WNL (numbness reproduced bilat forearms)  Extension WNL  Lateral Flexion WNL WNL  Rotation WNL*(numbness reproduced bilat forearms) WNL*      Shoulder   Flexion 4+ 4+  Extension    Abduction 4+ 4+* ( L shoulder pain)  Internal rotation 4+ 4+  External rotation  4+ 4+  Horizontal abduction    Horizontal adduction    Lower Trapezius    Rhomboids        Elbow  Flexion 5 5  Extension 5 5  Pronation    Supination        Wrist  Flexion 4+ 4+  Extension 5 5  Radial deviation    Ulnar deviation        (* = pain; Blank rows = not tested)  Sensation Grossly intact to light touch bilateral UE as determined by testing dermatomes C2-T2. Proprioception and hot/cold testing deferred on this date.  Reflexes R/L Elbow:  1+/1+  Brachioradialis: 2+/2+  Tricep: 2+/2+ UMN signs: Negative (Hoffman's and Clonus)  Palpation Location LEFT  RIGHT           Suboccipitals 1 1  Cervical paraspinals 1 1  Upper Trapezius 1 (n/t reproduced) 1 (n/t reproduced)  Levator Scapulae    Rhomboid Major/Minor    (Blank rows = not tested) Graded on 0-4 scale (0 = no pain, 1 = pain, 2 = pain with wincing/grimacing/flinching, 3 = pain with withdrawal, 4 = unwilling to allow palpation), (Blank rows = not tested)  Repeated Movements Repeated retraction: Mild pressure in back of head, no worse after  Passive Accessory Intervertebral Motion Significant hypomobility C3-C6, decreased sideglide bilaterally in mid to lower C-spine with pain at restriction   SPECIAL TESTS Spurlings A (ipsilateral lateral flexion/axial compression): R: Positive for neck pain L: Positive for neck pain Spurlings B (ipsilateral lateral flexion/contralateral rotation/axial  compression): R: Negative L: Negative Cervical compression: Reproduction of N/T Distraction Test: No pain, residual numbness that is not resolved  Hoffman Sign (cervical cord compression): R: Negative L: Negative ULTT Median: R: Negative L: Negative ULTT Ulnar: R: Positive for neck pain L: Negative ULTT Radial: R: Negative L: Negative     TODAY'S TREATMENT    SUBJECTIVE STATEMENT:   Patient was at beach this past week. He was able to perform throwing baseball with grandchildren, and he tolerated this well. Patient reports some degree of soreness in upper arm with throwing. He reports cervical spine stiffness in AM that gets better as day progresses. Pt denies pain at arrival to PT. Pt reports being mostly compliant with HEP (at least once per day). Pt has not played golf since last f/u with PT.   *GOAL UPDATE PERFORMED   Manual Therapy - for symptom modulation, soft tissue sensitivity and mobility, joint mobility, ROM   In supine: STM/DTM bilateral C4-6 splenius cervicis x 8 minutes  Bilateral cervical sideglides, gr III for improved mobility, C3-6; 3 x 30 sec on each side    Passive cervical spine rotation with contralateral anterior facet glide with therapist's digits 2-5 along articular pillar; x 10 for R and L     *not today* Manual traction, general in supine; x 3 minutes, 10-sec intermittent holds DTM/TPR bilateral upper traps; x 5 minutes  Manual traction with conjunct L rotation with oscillations to movement barrier for 1-sec; x 10 reps CPA along C3-4 and C5-7, grade III for mobility; therapist's 1st MCP along articular pillars bilat; 2 x 30 sec bouts Cervical traction with pt sidelying L; 10 sec intervals; 2 bouts x 5 minutes Contact-relax technique for R and L rotation; antagonist contraction; x 5 for either direction   -Cervical spine rotation: R 55, L 50  PA mobilization in supine, using PT's 1st MCPs on each articular pillar   -numbness bilaterally increased with  CPA at C4-6 Manual median nerve glides with maintenance of scapular depression, LUE; patient performing simultaneous cervical lateral flexion toward side of symptoms with extended position of wrist/elbow  X 20 repetitions    Therapeutic Exercise - for improved soft tissue flexibility and extensibility as needed for ROM, improved strength as needed to improve performance of CKC activities/functional movements    Cervical SNAG for rotation; reviewed  Cervical SNAG for extension; reviewed   Tennis ball forward throw to/from PT 15 and 20 feet; x 5 throws each  -to check for symptom reproduction or movement deficits/deviations   Golf swing on outdoor lawn; x 5 swings  -***  PATIENT  EDUCATION: Discussed current goals met, progress with PT, D/C plan.      *not today*  Standing row with Blue Tband; 2 x 10, 3 sec hold   -PT tactile cueing for scap retraction/depression Standing bilateral ER with scap retraction, Green Tband; 2 x 10  -modified tension on band to improve tolerance of posterior cuff strengthening In sitting: muscle energy technique for cervical rotation; antagonist contraction; 5-sec isometric; x 5 for R and L rotation Standing extension with Blue Tband; 2 x 10, 3 sec hold  -PT tactile cueing for scap retraction/depression  -verbal cueing for eccentric control    Supine, cervical sidebend stretch; 2 x 30 sec, to L and to R   -mod verbal cueing and demo for technique/lateral flexion versus rotation Median nerve flossing; 1x10 on either side Repeated cervical retraction, supine; 1 x 10  -reproduction of bilateral numbness, mildly remaining after  -minor numbness remaining after rest period  Repeated cervical flexion; 1 x 10  -numbness LUE>RUE   -after rest period 1 min, numbness remaining in L arm  Repeated cervical sidebend to L; 1 x 10 Scapular retractions; 1 x 10 and 1x6, 3 sec hold  -decreased reps on second set due to report of L UT pain      PATIENT  EDUCATION:  Education details: see above for patient education details Person educated: Patient Education method: Explanation, Demonstration, and Handouts Education comprehension: verbalized understanding and returned demonstration   HOME EXERCISE PROGRAM:  Access Code: [email or web address] URL: [email or web address] Date: 01/23/2024 Prepared by: Venetia Endo  Exercises - Seated Self Cervical Traction  - 2 x daily - 7 x weekly - 2 sets - 10 reps - 5-10sec hold - Supine Cervical Sidebending Stretch  - 2 x daily - 7 x weekly - 3 sets - 10 reps - 20-30sec hold - Median Nerve Flossing - Tray  - 2 x daily - 7 x weekly - 2 sets - 10 reps - 1sec hold - Seated Assisted Cervical Rotation with Towel  - 2 x daily - 7 x weekly - 1-2 sets - 10 reps - Mid-Lower Cervical Extension SNAG with Strap  - 2 x daily - 7 x weekly - 1-2 sets - 10 reps - Sidelying Thoracic Rotation with Open Book  - 2 x daily - 7 x weekly - 2 sets - 10 reps - 2sec hold - Standing Shoulder Row with Anchored Resistance  - 1 x daily - 4 x weekly - 2 sets - 10 reps - 3sec hold   ASSESSMENT:  CLINICAL IMPRESSION: Patient does present with notable stiffness at arrival to his appointments, but he has been able to attain functional cervical spine extension and bilat rotation with treatment. Pt is just shy of functional rotation ROM post-treatment today. He has intermittent discomfort with initiating golf during first 2 swings, but he was able to resume play with some fatigue and no major flare-up of symptoms. He tolerated throwing with his grandson relatively well. Pt has one remaining visit under current POC. Pt has current deficits in C-spine AROM, mid to lower cervical spine stiffness/hypomobility, decreased tolerance to loading bilat deltoid/RTC, and postural changes/upper crossed presentation. Pt will continue to benefit from skilled PT services to address deficits and improve function.   OBJECTIVE IMPAIRMENTS: decreased ROM,  decreased strength, hypomobility, impaired flexibility, impaired UE functional use, postural dysfunction, and pain.   ACTIVITY LIMITATIONS: carrying, lifting, bending, sleeping, transfers, bed mobility, and reach over head  PARTICIPATION LIMITATIONS: meal prep, driving, shopping, community activity, golfing,  and throwing baseball with son/grandson  PERSONAL FACTORS: Age, Past/current experiences, Time since onset of injury/illness/exacerbation, and 3+ comorbidities: (OA, HLD, pre-diabetes, chronic neck pain with acute flare-up) are also affecting patient's functional outcome.   REHAB POTENTIAL: Good  CLINICAL DECISION MAKING: Unstable/unpredictable  EVALUATION COMPLEXITY: High   GOALS: Goals reviewed with patient? Yes  SHORT TERM GOALS: Target date: 12/19/2023  Pt will be independent with HEP to improve strength and decrease neck pain to improve pain-free function at home and work. Baseline: 11/28/23: Baseline HEP initiated.    01/18/24: Pt verbalizes sound understanding of exercises and has largely maintained compliance.  Goal status: ACHIEVED   LONG TERM GOALS: Target date: 01/12/2024  Pt will have cervical spine flexion/extension AROM to 30-35 deg or greater and C-spine rotation AROM to 60 deg or greater bilaterally as needed for completion of ADLs without functional limitation Baseline: 11/28/23: Motion loss in all directions, pain with extension.    01/18/24: Mild deficit with extension and L rotation.    02/15/24: Mild deficit for rotation, minimal extension deficit Goal status: IN PROGRESS/MOSTLY MET   2.  Pt will decrease worst neck pain by at least 2 points on the NPRS in order to demonstrate clinically significant reduction in neck pain. Baseline: 11/28/23: 10/10 at worst.    01/18/24: 1/10 at worst, 4/10 pain with end-range cervical rotation.  Goal status: ACHIEVED  3.  Pt will decrease NDI score by at least 19% in order demonstrate clinically significant reduction in neck  pain/disability.       Baseline: 11/28/23: 10/50 = 20%    01/18/24: 3/50 = 6%   02/15/24: 2/50 = 4% Goal status: IN PROGRESS   4.  Pt will demonstrate golf swing and overhead throw with normal mechanics and no reproduction of upper quarter pain as needed for participation in golf/throwing baseball with family. Baseline: 11/28/23: Pt currently unable to swing golf club or throw.      01/18/24: Pt able to initiate golf swinging at home without notable pain.    02/15/24: *** Goal status: ON-GOING/IN PROGRESS   PLAN: PT FREQUENCY: 1x/week  PT DURATION: 3-4 weeks  PLANNED INTERVENTIONS: Therapeutic exercises, Therapeutic activity, Neuromuscular re-education, Balance training, Gait training, Patient/Family education, Self Care, Joint mobilization, Joint manipulation, Vestibular training, Canalith repositioning, Orthotic/Fit training, DME instructions, Dry Needling, Electrical stimulation, Spinal manipulation, Spinal mobilization, Cryotherapy, Moist heat, Taping, Traction, Ultrasound, Ionotophoresis 4mg /ml Dexamethasone , Manual therapy, and Re-evaluation.  PLAN FOR NEXT SESSION: Continue with cervical spine mobility/ROM, postural re-edu; progress HEP with successive visits.   Venetia Endo, PT, DPT #[id number]  Venetia ONEIDA Endo, PT 02/15/2024, 2:58 PM

## 2024-04-08 DIAGNOSIS — Z23 Encounter for immunization: Secondary | ICD-10-CM | POA: Diagnosis not present

## 2024-04-11 ENCOUNTER — Telehealth: Payer: Self-pay

## 2024-04-11 DIAGNOSIS — D2272 Melanocytic nevi of left lower limb, including hip: Secondary | ICD-10-CM | POA: Diagnosis not present

## 2024-04-11 DIAGNOSIS — L570 Actinic keratosis: Secondary | ICD-10-CM | POA: Diagnosis not present

## 2024-04-11 DIAGNOSIS — D225 Melanocytic nevi of trunk: Secondary | ICD-10-CM | POA: Diagnosis not present

## 2024-04-11 DIAGNOSIS — D2261 Melanocytic nevi of right upper limb, including shoulder: Secondary | ICD-10-CM | POA: Diagnosis not present

## 2024-04-11 DIAGNOSIS — D2262 Melanocytic nevi of left upper limb, including shoulder: Secondary | ICD-10-CM | POA: Diagnosis not present

## 2024-04-11 DIAGNOSIS — D2271 Melanocytic nevi of right lower limb, including hip: Secondary | ICD-10-CM | POA: Diagnosis not present

## 2024-04-11 DIAGNOSIS — Z23 Encounter for immunization: Secondary | ICD-10-CM

## 2024-04-11 DIAGNOSIS — B360 Pityriasis versicolor: Secondary | ICD-10-CM | POA: Diagnosis not present

## 2024-04-11 DIAGNOSIS — L821 Other seborrheic keratosis: Secondary | ICD-10-CM | POA: Diagnosis not present

## 2024-04-11 NOTE — Telephone Encounter (Signed)
 Tried to reach patient could not leave voice mail.

## 2024-04-11 NOTE — Telephone Encounter (Signed)
 Called and scheduled an appointment.  KP

## 2024-04-11 NOTE — Telephone Encounter (Signed)
 Please call pt to schedule a sooner appt to see Dr. Lemon so she can order the vaccine. Please schedule for him and his wife. Will send over wife's information.  KP

## 2024-04-11 NOTE — Telephone Encounter (Signed)
 Copied from CRM [id number]. Topic: Clinical - Medication Question >> Apr 11, 2024 10:49 AM Willma SAUNDERS wrote: Reason for CRM: Patient is requesting a prescription to get the Covid vaccine, states [facility], [address], [place], [place] [address], [phone] will provide them with a prescription.  Patient can be reached at [phone]

## 2024-04-11 NOTE — Telephone Encounter (Signed)
 Copied from CRM [id number]. Topic: Clinical - Medication Question >> Apr 11, 2024 10:49 AM Willma SAUNDERS wrote: Reason for CRM: Patient is requesting a prescription to get the Covid vaccine, states [facility], [address] [place], [place], [place] [address], [phone] will provide them with a prescription.  Patient can be reached at [phone] >> Apr 11, 2024  2:43 PM Fonda T wrote: Received call from patient, states he is returning several missed calls from the office, regarding a COVID vaccine.  Per chart review patient informed of message by Solmon Bohr.   Per requesting to speak directly to office, multiple attempts made to contact office, no response.  Patient is requesting a follow up call regarding this as he states he will be leaving out the country soon, and would like to have this taken care of as soon as possible prior to leaving.   Patient can be reached at [phone]

## 2024-04-11 NOTE — Telephone Encounter (Unsigned)
 Copied from CRM [id number]. Topic: Clinical - Medication Question >> Apr 11, 2024 10:49 AM Willma SAUNDERS wrote: Reason for CRM: Patient is requesting a prescription to get the Covid vaccine, states [facility], [address] [place], [place], [place] [address], [phone] will provide them with a prescription.  Patient can be reached at [phone] >> Apr 11, 2024  2:43 PM Fonda T wrote: Received call from patient, states he is returning several missed calls from the office, regarding a COVID vaccine.  Per chart review patient informed of message by Solmon Bohr.   Per requesting to speak directly to office, multiple attempts made to contact office, no response.  Patient is requesting a follow up call regarding this as he states he will be leaving out the country soon, and would like to have this taken care of as soon as possible prior to leaving.   Patient can be reached at [phone]

## 2024-04-12 ENCOUNTER — Encounter: Payer: Self-pay | Admitting: Student

## 2024-04-12 ENCOUNTER — Ambulatory Visit: Admitting: Student

## 2024-04-12 VITALS — BP 124/72 | HR 87 | Ht 70.0 in | Wt 189.2 lb

## 2024-04-12 DIAGNOSIS — Z23 Encounter for immunization: Secondary | ICD-10-CM | POA: Diagnosis not present

## 2024-04-12 NOTE — Progress Notes (Signed)
 Established Patient Office Visit  Subjective   Patient ID: Jeff Mcmahon, male    DOB: [birth year]  Age: 78 y.o. MRN: [id number]  No chief complaint on file.   [place] with medical hx listed below presents today for covid vaccine. Need rx for this to have this done administered at the pharmacy. Has upcoming trip to [place] and would like to have COVID vaccine prior to trip. Is feeling well with no respiratory symptoms today.   Patient Active Problem List   Diagnosis Date Noted   Need for COVID-19 vaccine 04/12/2024   Cervical radicular pain 11/08/2023   Chronic pain syndrome 11/08/2023   Retinal tear of right eye 12/21/2021   Dysphagia    Cervical spondylosis 10/02/2015   Prediabetes 09/09/2015   History of colonic polyps 09/05/2015   Hyperlipidemia 09/04/2015   GERD (gastroesophageal reflux disease) 09/04/2015   Dry eyes 09/04/2015      ROS Refer to HPI    Objective:     Outpatient Encounter Medications as of 04/12/2024  Medication Sig Note   acetaminophen  (TYLENOL ) 500 MG tablet Take 500 mg by mouth every 6 (six) hours as needed.    ezetimibe  (ZETIA ) 10 MG tablet Take 1 tablet (10 mg total) by mouth daily.    famotidine  (PEPCID ) 40 MG tablet Take 1 tablet (40 mg total) by mouth daily. 05/11/2023: Takes 20 mg q am and 40 mg q pm   Menthol-Methyl Salicylate (MUSCLE RUB) 10-15 % CREA Apply 1 Application topically as needed for muscle pain.    Multiple Vitamin (MULTIVITAMIN WITH MINERALS) TABS tablet Take 1 tablet by mouth daily.    Propylene Glycol (SYSTANE COMPLETE) 0.6 % SOLN Apply to eye.    psyllium (METAMUCIL) 58.6 % powder Take 1 packet by mouth 3 (three) times daily.    sodium chloride  (OCEAN) 0.65 % SOLN nasal spray Place 1 spray into both nostrils as needed for congestion.  02/04/2020: As needed   triamcinolone  (NASACORT ) 55 MCG/ACT AERO nasal inhaler Place 2 sprays into the nose daily. Dr. Juengel    No facility-administered encounter medications on file as  of 04/12/2024.    BP 124/72   Pulse 87   Ht 5' 10 (1.778 m)   Wt 189 lb 4 oz (85.8 kg)   SpO2 96%   BMI 27.15 kg/m  BP Readings from Last 3 Encounters:  04/12/24 124/72  01/12/24 115/74  12/13/23 128/76    Physical Exam Constitutional:      Appearance: Normal appearance.  HENT:     Mouth/Throat:     Mouth: Mucous membranes are moist.     Pharynx: Oropharynx is clear.  Cardiovascular:     Rate and Rhythm: Normal rate and regular rhythm.  Pulmonary:     Effort: Pulmonary effort is normal.     Breath sounds: No rhonchi or rales.  Abdominal:     General: Abdomen is flat. Bowel sounds are normal. There is no distension.     Palpations: Abdomen is soft.     Tenderness: There is no abdominal tenderness.  Musculoskeletal:        General: Normal range of motion.     Right lower leg: No edema.     Left lower leg: No edema.  Skin:    General: Skin is warm and dry.     Capillary Refill: Capillary refill takes less than 2 seconds.  Neurological:     General: No focal deficit present.     Mental Status: He is alert and  oriented to person, place, and time.  Psychiatric:        Mood and Affect: Mood normal.        Behavior: Behavior normal.        04/12/2024   10:53 AM 01/12/2024   11:32 AM 12/13/2023    8:02 AM  Depression screen PHQ 2/9  Decreased Interest 0 0 0  Down, Depressed, Hopeless 0 0 0  PHQ - 2 Score 0 0 0  Altered sleeping 0  0  Tired, decreased energy 0  0  Change in appetite 0  0  Feeling bad or failure about yourself  0  0  Trouble concentrating 0  0  Moving slowly or fidgety/restless 0  0  Suicidal thoughts 0  0  PHQ-9 Score 0  0  Difficult doing work/chores Not difficult at all  Not difficult at all       04/12/2024   10:53 AM 12/13/2023    8:02 AM 06/21/2023    7:58 AM 12/22/2022    7:50 AM  GAD 7 : Generalized Anxiety Score  Nervous, Anxious, on Edge 0 0 0 0  Control/stop worrying 0 0 0 0  Worry too much - different things 0 0 0 0  Trouble relaxing  0 0 0 0  Restless 0 0 0 0  Easily annoyed or irritable 0 0 0 0  Afraid - awful might happen 0 0 0 0  Total GAD 7 Score 0 0 0 0  Anxiety Difficulty Not difficult at all Not difficult at all Not difficult at all Not difficult at all    No results found for any visits on 04/12/24.  Last CBC Lab Results  Component Value Date   WBC 5.3 07/22/2017   HGB 16.0 07/22/2017   HCT 47.7 07/22/2017   MCV 96 07/22/2017   MCH 32.1 07/22/2017   RDW 13.7 07/22/2017   PLT 236 07/22/2017   Last metabolic panel Lab Results  Component Value Date   GLUCOSE 94 08/12/2022   NA 142 08/12/2022   K 4.4 08/12/2022   CL 101 08/12/2022   CO2 24 08/12/2022   BUN 12 08/12/2022   CREATININE 0.87 08/12/2022   EGFR 90 08/12/2022   CALCIUM  9.2 08/12/2022   PHOS 3.5 08/12/2022   PROT 7.4 07/25/2018   ALBUMIN 4.5 08/12/2022   LABGLOB 2.8 07/22/2017   AGRATIO 1.7 07/22/2017   BILITOT 0.6 07/25/2018   ALKPHOS 103 07/25/2018   AST 20 07/25/2018   ALT 22 07/25/2018   ANIONGAP 7 09/01/2016      The 10-year ASCVD risk score (Arnett DK, et al., 2019) is: 25.4%    Assessment & Plan:  Need for COVID-19 vaccine   Discussed risks and benefits of COVID vaccine including reduced risk of infection, hospitalization, and critical illness and risk of injection site reaction and systemic reaction. He would like vaccination. Unable to provide paper script for patient. Called [facility] in [place] and gave verbal order for vaccine. Patient plans to have this completed this afternoon.   No follow-ups on file.    Harlene Saddler, MD

## 2024-05-16 ENCOUNTER — Ambulatory Visit: Payer: Self-pay

## 2024-05-16 DIAGNOSIS — Z0000 Encounter for general adult medical examination without abnormal findings: Secondary | ICD-10-CM | POA: Diagnosis not present

## 2024-05-16 NOTE — Progress Notes (Signed)
 Subjective:   Jeff Mcmahon is a 78 y.o. who presents for a Medicare Wellness preventive visit.  As a reminder, Annual Wellness Visits don't include a physical exam, and some assessments may be limited, especially if this visit is performed virtually. We may recommend an in-person follow-up visit with your provider if needed.  Visit Complete: Virtual I connected with  Jeff Mcmahon on 05/16/24 by a audio enabled telemedicine application and verified that I am speaking with the correct person using two identifiers.  Patient Location: Home  Provider Location: Office/Clinic  I discussed the limitations of evaluation and management by telemedicine. The patient expressed understanding and agreed to proceed.  Vital Signs: Because this visit was a virtual/telehealth visit, some criteria may be missing or patient reported. Any vitals not documented were not able to be obtained and vitals that have been documented are patient reported.  VideoError- Librarian, academic were attempted between this provider and patient, however failed, due to patient having technical difficulties OR patient did not have access to video capability.  We continued and completed visit with audio only.   Persons Participating in Visit: Patient.  AWV Questionnaire: No: Patient Medicare AWV questionnaire was not completed prior to this visit.  Cardiac Risk Factors include: advanced age (>28men, >64 women);dyslipidemia;male gender     Objective:    There were no vitals filed for this visit. There is no height or weight on file to calculate BMI.     05/16/2024    9:41 AM 01/12/2024   11:32 AM 12/07/2023   10:05 AM 11/08/2023   10:38 AM 05/11/2023    9:45 AM 04/14/2022   10:08 AM 04/08/2021   10:30 AM  Advanced Directives  Does Patient Have a Medical Advance Directive? Yes Yes Yes Yes Yes Yes Yes  Type of Estate agent of [place];Living will Healthcare Power of  [place];Living will  Healthcare Power of [place];Living will Healthcare Power of [place];Living will  Healthcare Power of [place];Living will  Does patient want to make changes to medical advance directive? No - Patient declined    No - Patient declined No - Patient declined   Copy of Healthcare Power of Attorney in Chart? Yes - validated most recent copy scanned in chart (See row information)    Yes - validated most recent copy scanned in chart (See row information)  Yes - validated most recent copy scanned in chart (See row information)    Current Medications (verified) Outpatient Encounter Medications as of 05/16/2024  Medication Sig   acetaminophen  (TYLENOL ) 500 MG tablet Take 500 mg by mouth every 6 (six) hours as needed.   ezetimibe  (ZETIA ) 10 MG tablet Take 1 tablet (10 mg total) by mouth daily.   famotidine  (PEPCID ) 40 MG tablet Take 1 tablet (40 mg total) by mouth daily.   Menthol-Methyl Salicylate (MUSCLE RUB) 10-15 % CREA Apply 1 Application topically as needed for muscle pain.   Multiple Vitamin (MULTIVITAMIN WITH MINERALS) TABS tablet Take 1 tablet by mouth daily.   Propylene Glycol (SYSTANE COMPLETE) 0.6 % SOLN Apply to eye.   psyllium (METAMUCIL) 58.6 % powder Take 1 packet by mouth 3 (three) times daily.   sodium chloride  (OCEAN) 0.65 % SOLN nasal spray Place 1 spray into both nostrils as needed for congestion.    Sodium Fluoride (CLINPRO 5000) 1.1 % PSTE Place onto teeth daily.   triamcinolone  (NASACORT ) 55 MCG/ACT AERO nasal inhaler Place 2 sprays into the nose daily. Dr. Juengel   No facility-administered  encounter medications on file as of 05/16/2024.    Allergies (verified) Voltaren [diclofenac]   History: Past Medical History:  Diagnosis Date   Arthritis    Dysrhythmia 2007   H/O IRREGULAR HEART BEAT-SAW CARDIOLOGIST AND SAID IT WAS NOTHING TO BE CONCERNED WITH   GERD (gastroesophageal reflux disease)    H/O   Hyperlipidemia    Pre-diabetes    Past Surgical  History:  Procedure Laterality Date   COLONOSCOPY     COLONOSCOPY WITH PROPOFOL  N/A 06/02/2020   Procedure: COLONOSCOPY WITH PROPOFOL ;  Surgeon: Unk Corinn Skiff, MD;  Location: [facility];  Service: Gastroenterology;  Laterality: N/A;   ESOPHAGOGASTRODUODENOSCOPY     ESOPHAGOGASTRODUODENOSCOPY (EGD) WITH PROPOFOL  N/A 09/02/2020   Procedure: ESOPHAGOGASTRODUODENOSCOPY (EGD) WITH PROPOFOL ;  Surgeon: Unk Corinn Skiff, MD;  Location: [facility];  Service: Gastroenterology;  Laterality: N/A;   EXCISION MASS NECK N/A 09/07/2016   Procedure: EXCISION MASS NECK;  Surgeon: Charlie FORBES Fell, MD;  Location: [facility];  Service: General;  Laterality: N/A;   EXCISION OF BACK LESION N/A 09/07/2016   Procedure: EXCISION OF BACK LESION;  Surgeon: Charlie FORBES Fell, MD;  Location: [facility];  Service: General;  Laterality: N/A;   MOUTH SURGERY     dental procedure   RETINAL LASER PROCEDURE Right 04/06/2021   [facility] in [place]   Family History  Problem Relation Age of Onset   Lymphoma Mother 1   Hypertension Mother    Cancer Father        lung   Alcohol abuse Father    Mental illness Father    Heart disease Brother    Social History   Socioeconomic History   Marital status: Married    Spouse name: Not on file   Number of children: 2   Years of education: Not on file   Highest education level: Doctorate  Occupational History   Occupation: Retired  Tobacco Use   Smoking status: Never   Smokeless tobacco: Never   Tobacco comments:    smoking cessation materials not required  Vaping Use   Vaping status: Never Used  Substance and Sexual Activity   Alcohol use: Yes    Comment: social   Drug use: No   Sexual activity: Not Currently  Other Topics Concern   Not on file  Social History Narrative   Not on file   Social Drivers of Health   Financial Resource Strain: Low Risk  (05/16/2024)   Overall Financial Resource Strain (CARDIA)    Difficulty of  Paying Living Expenses: Not hard at all  Food Insecurity: No Food Insecurity (05/16/2024)   Hunger Vital Sign    Worried About Running Out of Food in the Last Year: Never true    Ran Out of Food in the Last Year: Never true  Transportation Needs: No Transportation Needs (05/16/2024)   PRAPARE - Administrator, Civil Service (Medical): No    Lack of Transportation (Non-Medical): No  Physical Activity: Sufficiently Active (05/16/2024)   Exercise Vital Sign    Days of Exercise per Week: 6 days    Minutes of Exercise per Session: 50 min  Stress: No Stress Concern Present (05/16/2024)   [employer] of Occupational Health - Occupational Stress Questionnaire    Feeling of Stress: Not at all  Social Connections: Socially Integrated (05/16/2024)   Social Connection and Isolation Panel    Frequency of Communication with Friends and Family: Three times a week    Frequency of Social Gatherings  with Friends and Family: Three times a week    Attends Religious Services: More than 4 times per year    Active Member of Clubs or Organizations: Yes    Attends Engineer, structural: More than 4 times per year    Marital Status: Married    Tobacco Counseling Counseling given: Not Answered Tobacco comments: smoking cessation materials not required    Clinical Intake:  Pre-visit preparation completed: Yes  Pain : No/denies pain     BMI - recorded: 27.1 Nutritional Status: BMI 25 -29 Overweight Nutritional Risks: None Diabetes: No  Lab Results  Component Value Date   HGBA1C 5.5 06/21/2023   HGBA1C 5.4 08/12/2022   HGBA1C 5.4 12/21/2021     How often do you need to have someone help you when you read instructions, pamphlets, or other written materials from your doctor or pharmacy?: 1 - Never  Interpreter Needed?: No  Information entered by :: Jeff Mcmahon, Jeff Mcmahon   Activities of Daily Living     05/16/2024    9:43 AM 05/14/2024    9:10 AM  In your present state  of health, do you have any difficulty performing the following activities:  Hearing? 0 0  Vision? 0 0  Difficulty concentrating or making decisions? 0 0  Walking or climbing stairs? 0 0  Dressing or bathing? 0 0  Doing errands, shopping? 0 0  Preparing Food and eating ? N N  Using the Toilet? N N  In the past six months, have you accidently leaked urine? N N  Do you have problems with loss of bowel control? N N  Managing your Medications? N N  Managing your Finances? N N  Housekeeping or managing your Housekeeping? N N    Patient Care Team: Lemon Raisin, MD as PCP - General (Internal Medicine) Dasher, Alm LABOR, MD as Consulting Physician (Dermatology) Edda Mt, MD (Otolaryngology) Unk Corinn Skiff, MD as Consulting Physician (Gastroenterology) Laurice Francis NOVAK, OD (Optometry)  I have updated your Care Teams any recent Medical Services you may have received from other providers in the past year.     Assessment:   This is a routine wellness examination for Jeff Mcmahon.  Hearing/Vision screen Hearing Screening - Comments:: NO AIDS Vision Screening - Comments:: WEARS GLASSES ALL DAY-[facility]   Goals Addressed             This Visit's Progress    Cut out extra servings         Depression Screen     05/16/2024    9:40 AM 04/12/2024   10:53 AM 01/12/2024   11:32 AM 12/13/2023    8:02 AM 12/07/2023   10:05 AM 11/08/2023   10:43 AM 06/21/2023    7:58 AM  PHQ 2/9 Scores  PHQ - 2 Score 0 0 0 0 0 0 0  PHQ- 9 Score 0 0  0   0    Fall Risk     05/14/2024    9:10 AM 04/12/2024   10:52 AM 01/12/2024   11:32 AM 12/13/2023    8:02 AM 12/07/2023   10:05 AM  Fall Risk   Falls in the past year? 1 1 0 0 0  Number falls in past yr: 0 0  0   Injury with Fall? 0 0  0   Risk for fall due to :  History of fall(s)  No Fall Risks   Follow up Falls evaluation completed;Falls prevention discussed Falls evaluation completed Falls evaluation completed  Falls evaluation completed      MEDICARE RISK AT HOME:  Medicare Risk at Home Any stairs in or around the home?: Yes If so, are there any without handrails?: No Home free of loose throw rugs in walkways, pet beds, electrical cords, etc?: Yes Adequate lighting in your home to reduce risk of falls?: Yes Life alert?: No Use of a cane, walker or w/c?: No Grab bars in the bathroom?: No Shower chair or bench in shower?: No Elevated toilet seat or a handicapped toilet?: Yes  TIMED UP AND GO:  Was the test performed?  No  Cognitive Function: 6CIT completed    12/21/2021    8:20 AM  MMSE - Mini Mental State Exam  Orientation to time 5  Orientation to Place 5  Registration 3  Attention/ Calculation 5  Recall 3  Language- name 2 objects 2  Language- repeat 1  Language- follow 3 step command 3  Language- read & follow direction 1  Write a sentence 1  Copy design 1  Total score 30        05/16/2024    9:45 AM 05/11/2023    9:47 AM 04/14/2022   10:10 AM 04/07/2020   10:48 AM 04/04/2019   10:38 AM  6CIT Screen  What Year? 0 points 0 points 0 points 0 points 0 points  What month? 0 points 0 points 0 points 0 points 0 points  What time? 0 points 0 points 0 points 0 points 0 points  Count back from 20 0 points 0 points 0 points 0 points 0 points  Months in reverse 0 points 0 points 0 points 0 points 0 points  Repeat phrase 0 points 0 points 0 points 0 points 0 points  Total Score 0 points 0 points 0 points 0 points 0 points    Immunizations Immunization History  Administered Date(s) Administered   Fluad Quad(high Dose 65+) 04/28/2022   Fluad Trivalent(High Dose 65+) 05/03/2023   INFLUENZA, HIGH DOSE SEASONAL PF 04/10/2017, 04/15/2019   Influenza Split 05/03/2023   Influenza-Unspecified 04/09/2018, 04/22/2020, 04/15/2021, 04/08/2024   PFIZER(Purple Top)SARS-COV-2 Vaccination 08/29/2019, 09/26/2019, 05/09/2020, 11/05/2020, 04/15/2021   PNEUMOCOCCAL CONJUGATE-20 06/23/2022   Pfizer Covid-19 Vaccine Bivalent  Booster 76yrs & up 12/07/2021   Pfizer(Comirnaty)Fall Seasonal Vaccine 12 years and older 11/17/2022   Pneumococcal Conjugate-13 09/04/2011   Pneumococcal Polysaccharide-23 09/04/2015   Respiratory Syncytial Virus Vaccine,Recomb Aduvanted(Arexvy) 05/17/2023   Tdap 07/23/2018   Unspecified SARS-COV-2 Vaccination 05/03/2023   Zoster Recombinant(Shingrix) 02/01/2017, 04/10/2017   Zoster, Live 09/04/2015    Screening Tests Health Maintenance  Topic Date Due   COVID-19 Vaccine (9 - 2024-25 season) 04/09/2024   Medicare Annual Wellness (AWV)  05/16/2025   Colonoscopy  06/02/2025   DTaP/Tdap/Td (2 - Td or Tdap) 07/23/2028   Pneumococcal Vaccine: 50+ Years  Completed   Influenza Vaccine  Completed   Hepatitis C Screening  Completed   Zoster Vaccines- Shingrix  Completed   Meningococcal B Vaccine  Aged Out    Health Maintenance Items Addressed: UP TO DATE W/ SHOTS; UP TO DATE ON COLONOSCOPY  Additional Screening:  Vision Screening: Recommended annual ophthalmology exams for early detection of glaucoma and other disorders of the eye. Is the patient up to date with their annual eye exam?  Yes  Who is the provider or what is the name of the office in which the patient attends annual eye exams? [facility]  Dental Screening: Recommended annual dental exams for proper oral hygiene  Community Resource Referral / Chronic  Care Management: CRR required this visit?  No   CCM required this visit?  No   Plan:    I have personally reviewed and noted the following in the patient's chart:   Medical and social history Use of alcohol, tobacco or illicit drugs  Current medications and supplements including opioid prescriptions. Patient is not currently taking opioid prescriptions. Functional ability and status Nutritional status Physical activity Advanced directives List of other physicians Hospitalizations, surgeries, and ER visits in previous 12 months Vitals Screenings to include  cognitive, depression, and falls Referrals and appointments  In addition, I have reviewed and discussed with patient certain preventive protocols, quality metrics, and best practice recommendations. A written personalized care plan for preventive services as well as general preventive health recommendations were provided to patient.   Jeff GORMAN Mcmahon, Jeff Mcmahon   [id number][birth year]   After Visit Summary: (MyChart) Due to this being a telephonic visit, the after visit summary with patients personalized plan was offered to patient via MyChart   Notes: Nothing significant to report at this time.

## 2024-05-16 NOTE — Patient Instructions (Addendum)
 [name],  Thank you for taking the time for your Medicare Wellness Visit. I appreciate your continued commitment to your health goals. Please review the care plan we discussed, and feel free to reach out if I can assist you further.  Medicare recommends these wellness visits once per year to help you and your care team stay ahead of potential health issues. These visits are designed to focus on prevention, allowing your provider to concentrate on managing your acute and chronic conditions during your regular appointments.  Please note that Annual Wellness Visits do not include a physical exam. Some assessments may be limited, especially if the visit was conducted virtually. If needed, we may recommend a separate in-person follow-up with your provider.  Ongoing Care Seeing your primary care provider every 3 to 6 months helps us  monitor your health and provide consistent, personalized care.   Referrals If a referral was made during today's visit and you haven't received any updates within two weeks, please contact the referred provider directly to check on the status.  Recommended Screenings:  Health Maintenance  Topic Date Due   COVID-19 Vaccine (9 - 2024-25 season) 04/09/2024   Medicare Annual Wellness Visit  05/16/2025   Colon Cancer Screening  06/02/2025   DTaP/Tdap/Td vaccine (2 - Td or Tdap) 07/23/2028   Pneumococcal Vaccine for age over 35  Completed   Flu Shot  Completed   Hepatitis C Screening  Completed   Zoster (Shingles) Vaccine  Completed   Meningitis B Vaccine  Aged Out       05/16/2024    9:41 AM  Advanced Directives  Does Patient Have a Medical Advance Directive? Yes  Type of Estate agent of [place];Living will  Does patient want to make changes to medical advance directive? No - Patient declined  Copy of Healthcare Power of Attorney in Chart? Yes - validated most recent copy scanned in chart (See row information)   Advance Care Planning  is important because it: Ensures you receive medical care that aligns with your values, goals, and preferences. Provides guidance to your family and loved ones, reducing the emotional burden of decision-making during critical moments.  Vision: Annual vision screenings are recommended for early detection of glaucoma, cataracts, and diabetic retinopathy. These exams can also reveal signs of chronic conditions such as diabetes and high blood pressure.  Dental: Annual dental screenings help detect early signs of oral cancer, gum disease, and other conditions linked to overall health, including heart disease and diabetes.  Please see the attached documents for additional preventive care recommendations.   NEXT AWV 05/29/25 @ 8:50 AM BY PHONE

## 2024-06-15 ENCOUNTER — Encounter: Admitting: Student

## 2024-06-19 ENCOUNTER — Encounter: Admitting: Student

## 2024-07-03 ENCOUNTER — Ambulatory Visit: Admitting: Student

## 2024-07-03 ENCOUNTER — Encounter: Payer: Self-pay | Admitting: Student

## 2024-07-03 VITALS — BP 120/64 | HR 88 | Ht 70.0 in | Wt 190.0 lb

## 2024-07-03 DIAGNOSIS — E785 Hyperlipidemia, unspecified: Secondary | ICD-10-CM | POA: Diagnosis not present

## 2024-07-03 DIAGNOSIS — R351 Nocturia: Secondary | ICD-10-CM | POA: Diagnosis not present

## 2024-07-03 DIAGNOSIS — R7303 Prediabetes: Secondary | ICD-10-CM | POA: Diagnosis not present

## 2024-07-03 DIAGNOSIS — M47812 Spondylosis without myelopathy or radiculopathy, cervical region: Secondary | ICD-10-CM | POA: Diagnosis not present

## 2024-07-03 MED ORDER — EZETIMIBE 10 MG PO TABS: 10.0000 mg | ORAL_TABLET | Freq: Every day | ORAL | 1 refills | Status: AC

## 2024-07-03 NOTE — Assessment & Plan Note (Signed)
Diet controlled.  A1c today. 

## 2024-07-03 NOTE — Assessment & Plan Note (Signed)
 No family hx of prostate cancer. Continues to have nocturia one a night, no significant changes frequency. Occasionally notes incomplete emptying. PSA today.

## 2024-07-03 NOTE — Progress Notes (Signed)
 Established Patient Office Visit  Subjective   Patient ID: Jeff Mcmahon, male    DOB: [birth year]  Age: 78 y.o. MRN: [id number]  Chief Complaint  Patient presents with   Hyperlipidemia    Jeff Mcmahon is a 78 y.o. person with medical hx listed below who presents today for transfer of care. Feeling well today. No acute complaints. Please refer to problem based charting for further details and assessment and plan of current problem and chronic medical conditions.  Patient Active Problem List   Diagnosis Date Noted   Nocturia 07/03/2024   Chronic pain syndrome 11/08/2023   Cervical spondylosis 10/02/2015   Prediabetes 09/09/2015   History of colonic polyps 09/05/2015   Hyperlipidemia 09/04/2015   GERD (gastroesophageal reflux disease) 09/04/2015   Dry eyes 09/04/2015      ROS Refer to HPI    Objective:     Outpatient Encounter Medications as of 07/03/2024  Medication Sig Note   acetaminophen  (TYLENOL ) 500 MG tablet Take 500 mg by mouth every 6 (six) hours as needed.    famotidine  (PEPCID ) 40 MG tablet Take 1 tablet (40 mg total) by mouth daily. 05/11/2023: Takes 20 mg q am and 40 mg q pm   Menthol-Methyl Salicylate (MUSCLE RUB) 10-15 % CREA Apply 1 Application topically as needed for muscle pain.    Multiple Vitamin (MULTIVITAMIN WITH MINERALS) TABS tablet Take 1 tablet by mouth daily.    Propylene Glycol (SYSTANE COMPLETE) 0.6 % SOLN Apply to eye.    psyllium (METAMUCIL) 58.6 % powder Take 1 packet by mouth 3 (three) times daily.    sodium chloride  (OCEAN) 0.65 % SOLN nasal spray Place 1 spray into both nostrils as needed for congestion.  02/04/2020: As needed   Sodium Fluoride (CLINPRO 5000) 1.1 % PSTE Place onto teeth daily.    triamcinolone  (NASACORT ) 55 MCG/ACT AERO nasal inhaler Place 2 sprays into the nose daily. Dr. Juengel    ezetimibe  (ZETIA ) 10 MG tablet Take 1 tablet (10 mg total) by mouth daily.    [DISCONTINUED] ezetimibe  (ZETIA ) 10 MG tablet Take 1 tablet  (10 mg total) by mouth daily.    No facility-administered encounter medications on file as of 07/03/2024.    BP 120/64   Pulse 88   Ht 5' 10 (1.778 m)   Wt 190 lb (86.2 kg)   SpO2 96%   BMI 27.26 kg/m  BP Readings from Last 3 Encounters:  07/03/24 120/64  04/12/24 124/72  01/12/24 115/74    Physical Exam Constitutional:      Appearance: Normal appearance.  HENT:     Head: Normocephalic and atraumatic.  Cardiovascular:     Rate and Rhythm: Normal rate and regular rhythm.     Pulses: Normal pulses.     Heart sounds: No murmur heard. Pulmonary:     Effort: Pulmonary effort is normal.     Breath sounds: No rhonchi or rales.  Abdominal:     General: Abdomen is flat. Bowel sounds are normal. There is no distension.     Palpations: Abdomen is soft.     Tenderness: There is no abdominal tenderness.  Musculoskeletal:     Right lower leg: Edema (trace) present.     Left lower leg: Edema (trace) present.  Skin:    General: Skin is warm and dry.     Capillary Refill: Capillary refill takes less than 2 seconds.  Neurological:     General: No focal deficit present.     Mental Status: He is  alert and oriented to person, place, and time.  Psychiatric:        Mood and Affect: Mood normal.        Behavior: Behavior normal.        07/03/2024    8:16 AM 05/16/2024    9:40 AM 04/12/2024   10:53 AM  Depression screen PHQ 2/9  Decreased Interest 0 0 0  Down, Depressed, Hopeless 0 0 0  PHQ - 2 Score 0 0 0  Altered sleeping  0 0  Tired, decreased energy  0 0  Change in appetite  0 0  Feeling bad or failure about yourself   0 0  Trouble concentrating  0 0  Moving slowly or fidgety/restless  0 0  Suicidal thoughts  0 0  PHQ-9 Score  0  0   Difficult doing work/chores  Not difficult at all Not difficult at all     Data saved with a previous flowsheet row definition       07/03/2024    8:16 AM 04/12/2024   10:53 AM 12/13/2023    8:02 AM 06/21/2023    7:58 AM  GAD 7 :  Generalized Anxiety Score  Nervous, Anxious, on Edge 0 0 0 0  Control/stop worrying 0 0 0 0  Worry too much - different things  0 0 0  Trouble relaxing  0 0 0  Restless  0 0 0  Easily annoyed or irritable  0 0 0  Afraid - awful might happen  0 0 0  Total GAD 7 Score  0 0 0  Anxiety Difficulty  Not difficult at all Not difficult at all Not difficult at all    No results found for any visits on 07/03/24.  Last CBC Lab Results  Component Value Date   WBC 5.3 07/22/2017   HGB 16.0 07/22/2017   HCT 47.7 07/22/2017   MCV 96 07/22/2017   MCH 32.1 07/22/2017   RDW 13.7 07/22/2017   PLT 236 07/22/2017   Last metabolic panel Lab Results  Component Value Date   GLUCOSE 94 08/12/2022   NA 142 08/12/2022   K 4.4 08/12/2022   CL 101 08/12/2022   CO2 24 08/12/2022   BUN 12 08/12/2022   CREATININE 0.87 08/12/2022   EGFR 90 08/12/2022   CALCIUM  9.2 08/12/2022   PHOS 3.5 08/12/2022   PROT 7.4 07/25/2018   ALBUMIN 4.5 08/12/2022   LABGLOB 2.8 07/22/2017   AGRATIO 1.7 07/22/2017   BILITOT 0.6 07/25/2018   ALKPHOS 103 07/25/2018   AST 20 07/25/2018   ALT 22 07/25/2018   ANIONGAP 7 09/01/2016   Last lipids Lab Results  Component Value Date   CHOL 199 06/21/2023   HDL 47 06/21/2023   LDLCALC 136 (H) 06/21/2023   TRIG 90 06/21/2023   CHOLHDL 3.9 12/31/2019   Last hemoglobin A1c Lab Results  Component Value Date   HGBA1C 5.5 06/21/2023   Last thyroid  functions Lab Results  Component Value Date   TSH 2.430 07/22/2017      The 10-year ASCVD risk score (Arnett DK, et al., 2019) is: 25.6%    Assessment & Plan:  Hyperlipidemia, unspecified hyperlipidemia type Assessment & Plan: The 10-year ASCVD risk score (Arnett DK, et al., 2019) is: 25.6%, previously on lipitor 10 mg daily for years and tolerated well but started getting leg pain and was switched zetia . No longer having leg pain, is not sure if lipitor was the source of pain. Lipid panel today. Restart Lipitor if  risk  remains elevated for primary prevention.   Orders: -     Lipid panel  Nocturia Assessment & Plan: No family hx of prostate cancer. Continues to have nocturia one a night, no significant changes frequency. Occasionally notes incomplete emptying. PSA today.   Orders: -     PSA  Prediabetes Assessment & Plan: Diet controlled. A1c today.   Orders: -     Hemoglobin A1c  Cervical spondylosis Assessment & Plan: No long seeing pain management, doing well with home exercises  from PT   Other orders -     Ezetimibe ; Take 1 tablet (10 mg total) by mouth daily.  Dispense: 90 tablet; Refill: 1     Return in about 6 months (around 12/31/2024) for HLD.    Harlene Saddler, MD

## 2024-07-03 NOTE — Patient Instructions (Signed)
 We will check bloodwork with lipid panel, A1c, and PSA, I will message you with results. If your LDL cholesterol is still high, we will restart lipitor.   Please follow up in 6 months

## 2024-07-03 NOTE — Assessment & Plan Note (Addendum)
 The 10-year ASCVD risk score (Arnett DK, et al., 2019) is: 25.6%, previously on lipitor 10 mg daily for years and tolerated well but started getting leg pain and was switched zetia . No longer having leg pain, is not sure if lipitor was the source of pain. Lipid panel today. Restart Lipitor if risk remains elevated for primary prevention.

## 2024-07-03 NOTE — Assessment & Plan Note (Signed)
 No long seeing pain management, doing well with home exercises  from PT

## 2024-07-04 DIAGNOSIS — R7303 Prediabetes: Secondary | ICD-10-CM | POA: Diagnosis not present

## 2024-07-04 DIAGNOSIS — R351 Nocturia: Secondary | ICD-10-CM | POA: Diagnosis not present

## 2024-07-04 DIAGNOSIS — E785 Hyperlipidemia, unspecified: Secondary | ICD-10-CM | POA: Diagnosis not present

## 2024-07-05 LAB — LIPID PANEL
Chol/HDL Ratio: 2.9 ratio (ref 0.0–5.0)
Cholesterol, Total: 132 mg/dL (ref 100–199)
HDL: 45 mg/dL (ref >39)
LDL Chol Calc (NIH): 72 mg/dL (ref 0–99)
Triglycerides: 77 mg/dL (ref 0–149)
VLDL Cholesterol Cal: 15 mg/dL (ref 5–40)

## 2024-07-05 LAB — PSA: Prostate Specific Ag, Serum: 1.2 ng/mL (ref 0.0–4.0)

## 2024-07-05 LAB — HEMOGLOBIN A1C
Est. average glucose Bld gHb Est-mCnc: 108 mg/dL
Hgb A1c MFr Bld: 5.4 % (ref 4.8–5.6)

## 2024-07-09 ENCOUNTER — Ambulatory Visit: Payer: Self-pay | Admitting: Student

## 2025-01-07 ENCOUNTER — Ambulatory Visit: Admitting: Student

## 2025-05-29 ENCOUNTER — Ambulatory Visit
